# Patient Record
Sex: Female | Born: 1945
Health system: Southern US, Community
[De-identification: ages and names within clinical notes are randomized; demographics above are authoritative.]

## PROBLEM LIST (undated history)

## (undated) DIAGNOSIS — G40909 Epilepsy, unspecified, not intractable, without status epilepticus: Secondary | ICD-10-CM

## (undated) DIAGNOSIS — I10 Essential (primary) hypertension: Secondary | ICD-10-CM

## (undated) DIAGNOSIS — I639 Cerebral infarction, unspecified: Secondary | ICD-10-CM

## (undated) DIAGNOSIS — H269 Unspecified cataract: Secondary | ICD-10-CM

## (undated) DIAGNOSIS — K219 Gastro-esophageal reflux disease without esophagitis: Secondary | ICD-10-CM

## (undated) DIAGNOSIS — F0150 Vascular dementia without behavioral disturbance: Secondary | ICD-10-CM

## (undated) DIAGNOSIS — E785 Hyperlipidemia, unspecified: Secondary | ICD-10-CM

## (undated) HISTORY — DX: Epilepsy, unspecified, not intractable, without status epilepticus: G40.909

## (undated) HISTORY — DX: Unspecified cataract: H26.9

## (undated) HISTORY — PX: BUNIONECTOMY: SHX129

## (undated) HISTORY — DX: Hyperlipidemia, unspecified: E78.5

## (undated) HISTORY — DX: Vascular dementia, unspecified severity, without behavioral disturbance, psychotic disturbance, mood disturbance, and anxiety: F01.50

---

## 2011-07-31 DIAGNOSIS — I639 Cerebral infarction, unspecified: Secondary | ICD-10-CM

## 2011-07-31 HISTORY — DX: Cerebral infarction, unspecified: I63.9

## 2011-08-06 ENCOUNTER — Inpatient Hospital Stay (HOSPITAL_COMMUNITY): Payer: Medicare Other

## 2011-08-06 ENCOUNTER — Inpatient Hospital Stay (HOSPITAL_COMMUNITY)
Admission: EM | Admit: 2011-08-06 | Discharge: 2011-08-07 | DRG: 066 | Disposition: A | Discharge status: Home / Self Care | Payer: Medicare Other | Attending: Internal Medicine | Admitting: Internal Medicine

## 2011-08-06 ENCOUNTER — Encounter (HOSPITAL_COMMUNITY): Payer: Self-pay

## 2011-08-06 ENCOUNTER — Emergency Department (HOSPITAL_COMMUNITY): Payer: Medicare Other

## 2011-08-06 DIAGNOSIS — M4120 Other idiopathic scoliosis, site unspecified: Secondary | ICD-10-CM | POA: Diagnosis not present

## 2011-08-06 DIAGNOSIS — R7989 Other specified abnormal findings of blood chemistry: Secondary | ICD-10-CM | POA: Diagnosis present

## 2011-08-06 DIAGNOSIS — I6350 Cerebral infarction due to unspecified occlusion or stenosis of unspecified cerebral artery: Secondary | ICD-10-CM | POA: Diagnosis not present

## 2011-08-06 DIAGNOSIS — I6330 Cerebral infarction due to thrombosis of unspecified cerebral artery: Secondary | ICD-10-CM

## 2011-08-06 DIAGNOSIS — R5383 Other fatigue: Secondary | ICD-10-CM | POA: Diagnosis not present

## 2011-08-06 DIAGNOSIS — R5381 Other malaise: Secondary | ICD-10-CM | POA: Diagnosis not present

## 2011-08-06 DIAGNOSIS — I10 Essential (primary) hypertension: Secondary | ICD-10-CM | POA: Diagnosis present

## 2011-08-06 DIAGNOSIS — R269 Unspecified abnormalities of gait and mobility: Secondary | ICD-10-CM | POA: Diagnosis not present

## 2011-08-06 DIAGNOSIS — Z720 Tobacco use: Secondary | ICD-10-CM | POA: Diagnosis present

## 2011-08-06 DIAGNOSIS — I639 Cerebral infarction, unspecified: Secondary | ICD-10-CM | POA: Diagnosis present

## 2011-08-06 DIAGNOSIS — R4789 Other speech disturbances: Secondary | ICD-10-CM | POA: Diagnosis not present

## 2011-08-06 DIAGNOSIS — F17200 Nicotine dependence, unspecified, uncomplicated: Secondary | ICD-10-CM | POA: Diagnosis present

## 2011-08-06 DIAGNOSIS — I619 Nontraumatic intracerebral hemorrhage, unspecified: Secondary | ICD-10-CM | POA: Diagnosis not present

## 2011-08-06 DIAGNOSIS — I6789 Other cerebrovascular disease: Secondary | ICD-10-CM | POA: Diagnosis not present

## 2011-08-06 HISTORY — DX: Gastro-esophageal reflux disease without esophagitis: K21.9

## 2011-08-06 HISTORY — DX: Essential (primary) hypertension: I10

## 2011-08-06 HISTORY — DX: Cerebral infarction, unspecified: I63.9

## 2011-08-06 LAB — BASIC METABOLIC PANEL
BUN: 19 mg/dL (ref 6–23)
CO2: 25 mEq/L (ref 19–32)
Calcium: 10.2 mg/dL (ref 8.4–10.5)
Glucose, Bld: 131 mg/dL — ABNORMAL HIGH (ref 70–99)
Sodium: 139 mEq/L (ref 135–145)

## 2011-08-06 LAB — CBC
HCT: 39.8 % (ref 36.0–46.0)
Hemoglobin: 13.3 g/dL (ref 12.0–15.0)
MCV: 85.6 fL (ref 78.0–100.0)
Platelets: 327 10*3/uL (ref 150–400)
RBC: 4.65 MIL/uL (ref 3.87–5.11)
WBC: 6.2 10*3/uL (ref 4.0–10.5)

## 2011-08-06 LAB — DIFFERENTIAL
Eosinophils Relative: 1 % (ref 0–5)
Lymphocytes Relative: 39 % (ref 12–46)
Lymphs Abs: 2.4 10*3/uL (ref 0.7–4.0)
Monocytes Relative: 6 % (ref 3–12)

## 2011-08-06 MED ORDER — SODIUM CHLORIDE 0.9 % IV SOLN: INTRAVENOUS | Status: DC

## 2011-08-06 MED ORDER — POTASSIUM CHLORIDE CRYS ER 20 MEQ PO TBCR: 40.0000 meq | EXTENDED_RELEASE_TABLET | Freq: Once | ORAL | Status: DC

## 2011-08-06 MED ORDER — ASPIRIN 300 MG RE SUPP
300.0000 mg | Freq: Every day | RECTAL | Status: DC
  Filled 2011-08-06 (×4): qty 1

## 2011-08-06 MED ORDER — SENNOSIDES-DOCUSATE SODIUM 8.6-50 MG PO TABS: 1.0000 | ORAL_TABLET | Freq: Every evening | ORAL | Status: DC | PRN

## 2011-08-06 MED ORDER — ENALAPRILAT 1.25 MG/ML IV SOLN
1.2500 mg | Freq: Four times a day (QID) | INTRAVENOUS | Status: DC | PRN
  Administered 2011-08-06: 1.25 mg via INTRAVENOUS
  Filled 2011-08-06: qty 2

## 2011-08-06 MED ORDER — ASPIRIN 325 MG PO TABS
325.0000 mg | ORAL_TABLET | Freq: Every day | ORAL | Status: DC
  Administered 2011-08-07: 325 mg via ORAL
  Filled 2011-08-06: qty 1

## 2011-08-06 MED ORDER — HEPARIN SODIUM (PORCINE) 5000 UNIT/ML IJ SOLN
5000.0000 [IU] | Freq: Three times a day (TID) | INTRAMUSCULAR | Status: DC
  Administered 2011-08-06 – 2011-08-07 (×2): 5000 [IU] via SUBCUTANEOUS
  Filled 2011-08-06: qty 1

## 2011-08-06 NOTE — ED Provider Notes (Signed)
History     CSN: [id number]  Arrival date & time 08/06/11  1336   First MD Initiated Contact with Patient 08/06/11 1352      Chief Complaint  Patient presents with  . Weakness    (Consider location/radiation/quality/duration/timing/severity/associated sxs/prior treatment) HPI...complains of weakness and pain in both arms for 3 weeks. Says her body is  "locked up".  Lanes of blurred vision, weakness, fatigue, slurred speech, vomiting.Marland Kitchen  He makes symptoms better or worse.  Symptoms are moderate.  History reviewed. No pertinent past medical history.  History reviewed. No pertinent past surgical history.  No family history on file.  History  Substance Use Topics  . Smoking status: Current Everyday Smoker  . Smokeless tobacco: Not on file  . Alcohol Use: No    OB History    Grav Para Term Preterm Abortions TAB SAB Ect Mult Living                  Review of Systems  All other systems reviewed and are negative.    Allergies  Review of patient's allergies indicates no known allergies.  Home Medications  No current outpatient prescriptions on file.  BP 190/81  Pulse 85  Temp(Src) 98.7 F (37.1 C) (Oral)  Resp 20  Ht 5\' 2"  (1.575 m)  Wt 145 lb (65.772 kg)  BMI 26.52 kg/m2  SpO2 100%  Physical Exam  Nursing note and vitals reviewed. Constitutional: She is oriented to person, place, and time. She appears well-developed and well-nourished.  HENT:  Head: Normocephalic and atraumatic.  Eyes: Conjunctivae and EOM are normal. Pupils are equal, round, and reactive to light.  Neck: Normal range of motion. Neck supple.  Cardiovascular: Normal rate and regular rhythm.   Pulmonary/Chest: Effort normal and breath sounds normal.  Abdominal: Soft. Bowel sounds are normal.  Musculoskeletal: Normal range of motion.  Neurological: She is alert and oriented to person, place, and time.  Skin: Skin is warm and dry.  Psychiatric: She has a normal mood and affect.    ED Course    Procedures (including critical care time)  Labs Reviewed  BASIC METABOLIC PANEL - Abnormal; Notable for the following:    Potassium 3.4 (*)    Glucose, Bld 131 (*)    GFR calc non Af Amer 58 (*)    GFR calc Af Amer 68 (*)    All other components within normal limits  HEMOGLOBIN A1C - Abnormal; Notable for the following:    Hemoglobin A1C 5.9 (*)    Mean Plasma Glucose 123 (*)    All other components within normal limits  CBC  DIFFERENTIAL  LIPID PANEL  LAB REPORT - SCANNED   No results found.   No diagnosis found. No results found.  Date: 08/06/2011  Rate: 83  Rhythm: normal sinus rhythm  QRS Axis: normal  Intervals: normal  ST/T Wave abnormalities: normal  Conduction Disutrbances:none  Narrative Interpretation:   Old EKG Reviewed: none available LVH  MDM  Uncertain etiology of symptom complex. Now feels comfortable saying this patient home. Will admit        Donnetta Hutching, MD 08/10/11 2020

## 2011-08-06 NOTE — ED Notes (Signed)
Pt in Radiology for testing. VSS. No acute distress noted before pt transported.

## 2011-08-06 NOTE — ED Notes (Signed)
Pt returned to room from Radiology. Placed on cardiac monitor. No acute distress noted at present. Pt alert and oriented, moving all extremities. No slur to speech.

## 2011-08-06 NOTE — ED Notes (Signed)
Pt c/o bilateral weakness and pain in both arms x 3 weeks.  Saturday says her body "locked up"  Had slurred speech, blurred vision, and fatigue.  Started vomiting SUnday.

## 2011-08-06 NOTE — H&P (Addendum)
[name] MRN: [id number] DOB/AGE: 07-25-1945 66 y.o.  Admit date: 08/06/2011 Chief Complaint: Abnormal gait. HPI: This 66 year old lady has had problems with gait for the last 3-4 weeks. She also describes difficulty combing her hair and tying her shoelaces. The family are noticed slurred speech in the last few days, which is improved. When she presented to the emergency room today, she was noted to have a CVA in the right MCA territory on CT brain scan. She is now being admitted for further management.  History reviewed. No pertinent past medical history. History reviewed. No pertinent past surgical history.       Family history: Patient's mother had hypertension. Social History:  Patient is divorced and lives with her daughter. She smokes one pack of cigarettes per day. She does not drink alcohol.  Allergies: No Known Allergies   (Not in a hospital admission)     MWN:UUVOZ from the symptoms mentioned above,there are no other symptoms referable to all systems reviewed.  Physical Exam: Blood pressure 158/80, pulse 81, temperature 98.5 F (36.9 C), temperature source Oral, resp. rate 16, height 5\' 2"  (1.575 m), weight 65.772 kg (145 lb), SpO2 100.00%. She looks systemically well. She is alert and orientated. Her speech is normal. There are no obvious cranial nerve abnormalities. There is really no significant weakness in her limbs. Plantars are downgoing. Heart sounds are present and normal. There are no no murmurs. There are no carotid bruits. Lung fields are clear. Abdomen is soft nontender with no evidence of masses, or hepatosplenomegaly.    Basename 08/06/11 1433  WBC 6.2  NEUTROABS 3.3  HGB 13.3  HCT 39.8  MCV 85.6  PLT 327    Basename 08/06/11 1433  NA 139  K 3.4*  CL 103  CO2 25  GLUCOSE 131*  BUN 19  CREATININE 0.98  CALCIUM 10.2  MG --         Ct Head Wo Contrast  08/06/2011  *RADIOLOGY REPORT*  Clinical Data: Right-sided weakness.  Slurred speech.   CT HEAD WITHOUT CONTRAST  Technique:  Contiguous axial images were obtained from the base of the skull through the vertex without contrast.  Comparison: None.  Findings: There is low attenuation in the right posterior frontal and parietal region.  The distribution is compatible with distal right MCA infarct.  This involves less than one third of the right MCA distribution.  Small right basal ganglia lacunar infarct is present. Questionable low attenuation is present in the inferior right temporal lobe.  This area is difficult to evaluate because of beam hardening artifact from the skull base.  There is no hemorrhage, mass lesion, midline shift or hydrocephalus.  Small lacunar infarct is present in the mid to posterior right frontal lobe (image number 21 series 2).  The calvarium appears intact.  Mastoid air cells are clear.  The probable mucocele in the right posterior ethmoid air cell, with mild expansion.  IMPRESSION: 1.  Right middle cerebral artery distribution low attenuation compatible with subacute ischemic infarction.  No hemorrhagic transformation. 2.  Scattered smaller lacunar infarctions bilaterally are age indeterminant. 3.  Right posterior ethmoid air cell mucocele.  Critical Value/emergent results were called by telephone at the time of interpretation on 08/06/2011  at 1455 hours  to  Dr. Adriana Simas, who verbally acknowledged these results.  Original Report Authenticated By: Andreas Newport, M.D.   Impression: 1. Right middle cerebral artery territory CVA. Also, she has other several small lacunar infarcts bilaterally. 2. Hypertension, likely long-standing with abnormal  electrocardiogram and evidence of  LVH. 3. Tobacco abuse.      Plan:  1. Admit to telemetry floor. 2. Investigations relevant to CVA  stroke per protocol. 3. I have counseled the patient on tobacco cessation. 4. Monitor blood pressure closely. Likely she will need antihypertensive medications. 5. Start aspirin 325 mg  daily. Further recommendations will depend on patient's hospital progress.       Wilson Singer Pager [phone]  08/06/2011, 4:37 PM

## 2011-08-06 NOTE — ED Notes (Signed)
Put patient on monitor 

## 2011-08-06 NOTE — ED Notes (Signed)
Pt transferred via stretcher to room 339. Report given. Falls bracelet and red socks in place. IV patent. Pt alert and oriented. Family with pt.

## 2011-08-07 DIAGNOSIS — I10 Essential (primary) hypertension: Secondary | ICD-10-CM

## 2011-08-07 DIAGNOSIS — I6330 Cerebral infarction due to thrombosis of unspecified cerebral artery: Secondary | ICD-10-CM

## 2011-08-07 DIAGNOSIS — F17200 Nicotine dependence, unspecified, uncomplicated: Secondary | ICD-10-CM

## 2011-08-07 LAB — LIPID PANEL
LDL Cholesterol: 90 mg/dL (ref 0–99)
Triglycerides: 136 mg/dL (ref <150)
VLDL: 27 mg/dL (ref 0–40)

## 2011-08-07 MED ORDER — SIMVASTATIN 20 MG PO TABS: 20.0000 mg | ORAL_TABLET | Freq: Every day | ORAL | Status: DC

## 2011-08-07 MED ORDER — ENALAPRIL MALEATE 5 MG PO TABS
5.0000 mg | ORAL_TABLET | Freq: Every day | ORAL | Status: DC
  Administered 2011-08-07: 5 mg via ORAL
  Filled 2011-08-07: qty 1

## 2011-08-07 MED ORDER — ENALAPRIL MALEATE 5 MG PO TABS: 5.0000 mg | ORAL_TABLET | Freq: Every day | ORAL | Status: DC

## 2011-08-07 MED ORDER — ASPIRIN 325 MG PO TABS: 325.0000 mg | ORAL_TABLET | Freq: Every day | ORAL | Status: AC

## 2011-08-07 NOTE — Consult Note (Signed)
Reason for Consult: Referring Physician:   [name] is an 66 y.o. female.  HPI:   Past Medical History  Diagnosis Date  . Hypertension   . GERD (gastroesophageal reflux disease)   . Stroke     History reviewed. No pertinent past surgical history.  No family history on file.  Social History:  reports that she has been smoking Cigarettes.  She has a 50 pack-year smoking history. She has never used smokeless tobacco. She reports that she does not drink alcohol or use illicit drugs.  Allergies: No Known Allergies  Medications:  Prior to Admission medications   Medication Sig Start Date End Date Taking? Authorizing Provider  ibuprofen (ADVIL,MOTRIN) 200 MG tablet Take 200 mg by mouth daily as needed. For pain   Yes Historical Provider, MD   Scheduled Meds:   . aspirin  300 mg Rectal Daily   Or  . aspirin  325 mg Oral Daily  . heparin  5,000 Units Subcutaneous Q8H  . potassium chloride  40 mEq Oral Once  . simvastatin  20 mg Oral q1800   Continuous Infusions:   . DISCONTD: sodium chloride     PRN Meds:.enalaprilat, senna-docusate   Results for orders placed during the hospital encounter of 08/06/11 (from the past 48 hour(s))  CBC     Status: Normal   Collection Time   08/06/11  2:33 PM      Component Value Range Comment   WBC 6.2  4.0 - 10.5 (K/uL)    RBC 4.65  3.87 - 5.11 (MIL/uL)    Hemoglobin 13.3  12.0 - 15.0 (g/dL)    HCT 65.7  84.6 - 96.2 (%)    MCV 85.6  78.0 - 100.0 (fL)    MCH 28.6  26.0 - 34.0 (pg)    MCHC 33.4  30.0 - 36.0 (g/dL)    RDW 95.2  84.1 - 32.4 (%)    Platelets 327  150 - 400 (K/uL)   DIFFERENTIAL     Status: Normal   Collection Time   08/06/11  2:33 PM      Component Value Range Comment   Neutrophils Relative 54  43 - 77 (%)    Neutro Abs 3.3  1.7 - 7.7 (K/uL)    Lymphocytes Relative 39  12 - 46 (%)    Lymphs Abs 2.4  0.7 - 4.0 (K/uL)    Monocytes Relative 6  3 - 12 (%)    Monocytes Absolute 0.4  0.1 - 1.0 (K/uL)    Eosinophils Relative  1  0 - 5 (%)    Eosinophils Absolute 0.1  0.0 - 0.7 (K/uL)    Basophils Relative 0  0 - 1 (%)    Basophils Absolute 0.0  0.0 - 0.1 (K/uL)   BASIC METABOLIC PANEL     Status: Abnormal   Collection Time   08/06/11  2:33 PM      Component Value Range Comment   Sodium 139  135 - 145 (mEq/L)    Potassium 3.4 (*) 3.5 - 5.1 (mEq/L)    Chloride 103  96 - 112 (mEq/L)    CO2 25  19 - 32 (mEq/L)    Glucose, Bld 131 (*) 70 - 99 (mg/dL)    BUN 19  6 - 23 (mg/dL)    Creatinine, Ser 4.01  0.50 - 1.10 (mg/dL)    Calcium 02.7  8.4 - 10.5 (mg/dL)    GFR calc non Af Amer 58 (*) >90 (mL/min)  GFR calc Af Amer 68 (*) >90 (mL/min)   LIPID PANEL     Status: Normal   Collection Time   08/07/11  5:28 AM      Component Value Range Comment   Cholesterol 173  0 - 200 (mg/dL)    Triglycerides 161  <150 (mg/dL)    HDL 56  >09 (mg/dL)    Total CHOL/HDL Ratio 3.1      VLDL 27  0 - 40 (mg/dL)    LDL Cholesterol 90  0 - 99 (mg/dL)     Dg Chest 2 View  6/0/4540  *RADIOLOGY REPORT*  Clinical Data: Stroke  CHEST - 2 VIEW  Comparison: None.  Findings: Severe dextroscoliosis in the upper lumbar spine is present.  This distorts the lower right thorax.  Heart is normal in size.  Lungs are hyperaerated.  No pneumothorax.  No pleural effusion.  No definite consolidation or mass.  Interstitial prominence is noted.  IMPRESSION: Chronic changes.  No active cardiopulmonary disease. Dextroscoliosis of the lumbar spine is prominent.  Original Report Authenticated By: Donavan Burnet, M.D.   Ct Head Wo Contrast  08/06/2011  *RADIOLOGY REPORT*  Clinical Data: Right-sided weakness.  Slurred speech.  CT HEAD WITHOUT CONTRAST  Technique:  Contiguous axial images were obtained from the base of the skull through the vertex without contrast.  Comparison: None.  Findings: There is low attenuation in the right posterior frontal and parietal region.  The distribution is compatible with distal right MCA infarct.  This involves less than one  third of the right MCA distribution.  Small right basal ganglia lacunar infarct is present. Questionable low attenuation is present in the inferior right temporal lobe.  This area is difficult to evaluate because of beam hardening artifact from the skull base.  There is no hemorrhage, mass lesion, midline shift or hydrocephalus.  Small lacunar infarct is present in the mid to posterior right frontal lobe (image number 21 series 2).  The calvarium appears intact.  Mastoid air cells are clear.  The probable mucocele in the right posterior ethmoid air cell, with mild expansion.  IMPRESSION: 1.  Right middle cerebral artery distribution low attenuation compatible with subacute ischemic infarction.  No hemorrhagic transformation. 2.  Scattered smaller lacunar infarctions bilaterally are age indeterminant. 3.  Right posterior ethmoid air cell mucocele.  Critical Value/emergent results were called by telephone at the time of interpretation on 08/06/2011  at 1455 hours  to  Dr. Adriana Simas, who verbally acknowledged these results.  Original Report Authenticated By: Andreas Newport, M.D.   Mr [name] Head Wo Contrast  08/06/2011  *RADIOLOGY REPORT*  Clinical Data:  Gait difficulty.  Slurred speech.  MRI HEAD WITHOUT CONTRAST MRA HEAD WITHOUT CONTRAST  Technique:  Multiplanar, multiecho pulse sequences of the brain and surrounding structures were obtained without intravenous contrast. Angiographic images of the head were obtained using MRA technique without contrast.  Comparison:  CT head performed earlier in the day.  MRI HEAD  Findings:  Multifocal areas of acute infarction affect the right hemisphere predominately affecting the right posterior frontoparietal region. Slight T1 shortening and slight T2 shortening on gradient sequence suggests reperfusion, possibly embolic or related to late acute or early subacute infarction.  Low level restricted diffusion can be seen in the more anterior frontal cortex and subcortical white matter  as well as the medial right middle cerebellar peduncle likely older subacute infarcts.  Mild atrophy is present.  Chronic microvascular ischemic changes are noted.  There is evidence  for remote basal ganglia, thalamic, and deep white matter lacunar infarcts. Remote multifocal right cerebellar infarctions noted.  The major intracranial vessels structures are widely patent. Pituitary and cranial vertebral junction unremarkable.  Moderate cervical spondylosis suspected.  Retention cyst versus mucocele posterior ethmoid region on the right. No mastoid fluid.  IMPRESSION: Multifocal areas of acute infarction with the largest area of involvement in the right posterior frontoparietal region.  There are some areas of acute to subacute gyriform like hemorrhage suggesting embolic stroke or reperfusion type phenomenon.  Right frontal and right cerebellar infarcts display  low level restricted diffusion suggesting an earlier time course, possibly later subacute infarction.  Evidence for multifocal remote cerebellar and bilateral supratentorial infarctions.  MRA HEAD  Findings: Motion degraded images result in misregistration of the skull base internal carotid arteries and proximal basilar arteries which are grossly patent.  Dissection cannot however be excluded. There is no visible stenosis of the carotid or basilar arteries. Vertebrals are codominant.  There is no proximal anterior, middle, or posterior cerebral artery stenosis or occlusion.  No definite intracranial aneurysm.  IMPRESSION: Motion degraded examination.  No definite carotid, basilar, or proximal intracranial lesion.  Original Report Authenticated By: Elsie Stain, M.D.   Mr Brain Wo Contrast  08/06/2011  *RADIOLOGY REPORT*  Clinical Data:  Gait difficulty.  Slurred speech.  MRI HEAD WITHOUT CONTRAST MRA HEAD WITHOUT CONTRAST  Technique:  Multiplanar, multiecho pulse sequences of the brain and surrounding structures were obtained without intravenous contrast.  Angiographic images of the head were obtained using MRA technique without contrast.  Comparison:  CT head performed earlier in the day.  MRI HEAD  Findings:  Multifocal areas of acute infarction affect the right hemisphere predominately affecting the right posterior frontoparietal region. Slight T1 shortening and slight T2 shortening on gradient sequence suggests reperfusion, possibly embolic or related to late acute or early subacute infarction.  Low level restricted diffusion can be seen in the more anterior frontal cortex and subcortical white matter as well as the medial right middle cerebellar peduncle likely older subacute infarcts.  Mild atrophy is present.  Chronic microvascular ischemic changes are noted.  There is evidence for remote basal ganglia, thalamic, and deep white matter lacunar infarcts. Remote multifocal right cerebellar infarctions noted.  The major intracranial vessels structures are widely patent. Pituitary and cranial vertebral junction unremarkable.  Moderate cervical spondylosis suspected.  Retention cyst versus mucocele posterior ethmoid region on the right. No mastoid fluid.  IMPRESSION: Multifocal areas of acute infarction with the largest area of involvement in the right posterior frontoparietal region.  There are some areas of acute to subacute gyriform like hemorrhage suggesting embolic stroke or reperfusion type phenomenon.  Right frontal and right cerebellar infarcts display  low level restricted diffusion suggesting an earlier time course, possibly later subacute infarction.  Evidence for multifocal remote cerebellar and bilateral supratentorial infarctions.  MRA HEAD  Findings: Motion degraded images result in misregistration of the skull base internal carotid arteries and proximal basilar arteries which are grossly patent.  Dissection cannot however be excluded. There is no visible stenosis of the carotid or basilar arteries. Vertebrals are codominant.  There is no proximal  anterior, middle, or posterior cerebral artery stenosis or occlusion.  No definite intracranial aneurysm.  IMPRESSION: Motion degraded examination.  No definite carotid, basilar, or proximal intracranial lesion.  Original Report Authenticated By: Elsie Stain, M.D.   US Carotid Duplex Bilateral  08/07/2011  *RADIOLOGY REPORT*  Clinical Data: Acute CVA; assess for carotid  disease.  BILATERAL CAROTID DUPLEX ULTRASOUND  Technique: Wallace Cullens scale imaging, color Doppler and duplex ultrasound was performed of bilateral carotid and vertebral arteries in the neck.  Comparison:  None  Criteria:  Quantification of carotid stenosis is based on velocity parameters that correlate the residual internal carotid diameter with NASCET-based stenosis levels, using the diameter of the distal internal carotid lumen as the denominator for stenosis measurement.  The following velocity measurements were obtained:                   PEAK SYSTOLIC/END DIASTOLIC RIGHT ICA:                        84 / 25cm/sec CCA:                        78 / 9cm/sec SYSTOLIC ICA/CCA RATIO:     1.08 DIASTOLIC ICA/CCA RATIO:    2.15 ECA:                        84cm/sec  LEFT ICA:                        87 / 25cm/sec CCA:                        91 / 19cm/sec SYSTOLIC ICA/CCA RATIO:     0.95 DIASTOLIC ICA/CCA RATIO:    1.27 ECA:                        78cm/sec  Findings:  RIGHT CAROTID ARTERY: There is minimal intramural thrombus noted along the right common carotid artery, without significant stenosis.  There is tortuosity of the right internal carotid artery.  Waveform morphologies remain within normal limits.  RIGHT VERTEBRAL ARTERY:  Antegrade  LEFT CAROTID ARTERY: There is minimal intramural thrombus noted along the left common carotid artery, without significant stenosis. There is tortuosity of the left internal carotid artery.  Waveform morphologies remain within normal limits.  LEFT VERTEBRAL ARTERY:  Antegrade  IMPRESSION: Grossly unremarkable carotid  ultrasound; minimal intramural thrombus along the common carotid arteries, without evidence of clinically significant stenosis.  Tortuosity of the internal carotid arteries bilaterally.  Original Report Authenticated By: Tonia Ghent, M.D.    Review of Systems  Constitutional: Negative.   Eyes: Negative.  Photophobia: right shoulder pain.  Respiratory: Negative.   Cardiovascular: Negative.   Genitourinary: Negative.   Skin: Negative.   Neurological: Positive for headaches.  Endo/Heme/Allergies: Negative.   Psychiatric/Behavioral: Negative.        R shoulder pain   Blood pressure 145/69, pulse 67, temperature 98 F (36.7 C), temperature source Oral, resp. rate 20, height 5\' 2"  (1.575 m), weight 66.2 kg (145 lb 15.1 oz), SpO2 99.00%. Physical Exam  Assessment/Plan: See dict  [name] 08/07/2011, 9:43 AM

## 2011-08-07 NOTE — Progress Notes (Signed)
*  PRELIMINARY RESULTS* Echocardiogram 2D Echocardiogram has been performed.  [name] 08/07/2011, 10:38 AM

## 2011-08-07 NOTE — Care Management Note (Unsigned)
    Page 1 of 1   08/07/2011     3:21:52 PM   CARE MANAGEMENT NOTE 08/07/2011  Patient:  [name], [name]   Account Number:  [id number]  Date Initiated:  08/07/2011  Documentation initiated by:  Rosemary Holms  Subjective/Objective Assessment:   Pt admitted with CVA with symptoms for 4 weeks. Family wishes to have HH PT. Barrier to [facility] is no PCP. Family member states that pt does have an appointment with Dr. Jeanice Lim on 5/28 but would not sign HH orders. Requested assistance w/ M'[facility]id     Action/Plan:   Pt to be discharged with family assistance. Family will evaluate ambulation and speak to Dr. Jeanice Lim about OP PT.Marland Kitchen   Anticipated DC Date:  08/08/2011   Anticipated DC Plan:  HOME/SELF CARE  In-house referral  Financial Counselor      DC Planning Services  CM consult      Choice offered to / List presented to:             Status of service:  In process, will continue to follow Medicare Important Message given?   (If response is "NO", the following Medicare IM given date fields will be blank) Date Medicare IM given:   Date Additional Medicare IM given:    Discharge Disposition:    Per UR Regulation:    If discussed at Long Length of Stay Meetings, dates discussed:    Comments:  08/07/11 1500 Yelitza Reach Leanord Hawking RN BSN CM

## 2011-08-07 NOTE — Discharge Summary (Signed)
Physician Discharge Summary  Patient ID: [name] MRN: [id number] DOB/AGE: 1945-07-04 66 y.o.  Admit date: 08/06/2011 Discharge date: 08/07/2011    Discharge Diagnoses:  1. Acute CVA, multi-infarct. 2. Hypertension. 3. Tobacco abuse. 4. Suboptimal LDL cholesterol in the setting of cerebrovascular disease.   Medication List  As of 08/07/2011  4:29 PM   TAKE these medications         aspirin 325 MG tablet   Take 1 tablet (325 mg total) by mouth daily.      enalapril 5 MG tablet   Commonly known as: VASOTEC   Take 1 tablet (5 mg total) by mouth daily.      ibuprofen 200 MG tablet   Commonly known as: ADVIL,MOTRIN   Take 200 mg by mouth daily as needed. For pain      simvastatin 20 MG tablet   Commonly known as: ZOCOR   Take 1 tablet (20 mg total) by mouth daily at 6 PM.            Discharged Condition: Stable.    Consults: Neurology, Dr Gerilyn Pilgrim.  Significant Diagnostic Studies: Dg Chest 2 View  08/06/2011  *RADIOLOGY REPORT*  Clinical Data: Stroke  CHEST - 2 VIEW  Comparison: None.  Findings: Severe dextroscoliosis in the upper lumbar spine is present.  This distorts the lower right thorax.  Heart is normal in size.  Lungs are hyperaerated.  No pneumothorax.  No pleural effusion.  No definite consolidation or mass.  Interstitial prominence is noted.  IMPRESSION: Chronic changes.  No active cardiopulmonary disease. Dextroscoliosis of the lumbar spine is prominent.  Original Report Authenticated By: Donavan Burnet, M.D.   Ct Head Wo Contrast  08/06/2011  *RADIOLOGY REPORT*  Clinical Data: Right-sided weakness.  Slurred speech.  CT HEAD WITHOUT CONTRAST  Technique:  Contiguous axial images were obtained from the base of the skull through the vertex without contrast.  Comparison: None.  Findings: There is low attenuation in the right posterior frontal and parietal region.  The distribution is compatible with distal right MCA infarct.  This involves less than one third of the  right MCA distribution.  Small right basal ganglia lacunar infarct is present. Questionable low attenuation is present in the inferior right temporal lobe.  This area is difficult to evaluate because of beam hardening artifact from the skull base.  There is no hemorrhage, mass lesion, midline shift or hydrocephalus.  Small lacunar infarct is present in the mid to posterior right frontal lobe (image number 21 series 2).  The calvarium appears intact.  Mastoid air cells are clear.  The probable mucocele in the right posterior ethmoid air cell, with mild expansion.  IMPRESSION: 1.  Right middle cerebral artery distribution low attenuation compatible with subacute ischemic infarction.  No hemorrhagic transformation. 2.  Scattered smaller lacunar infarctions bilaterally are age indeterminant. 3.  Right posterior ethmoid air cell mucocele.  Critical Value/emergent results were called by telephone at the time of interpretation on 08/06/2011  at 1455 hours  to  Dr. Adriana Simas, who verbally acknowledged these results.  Original Report Authenticated By: Andreas Newport, M.D.   Mr [name] Head Wo Contrast  08/06/2011  *RADIOLOGY REPORT*  Clinical Data:  Gait difficulty.  Slurred speech.  MRI HEAD WITHOUT CONTRAST MRA HEAD WITHOUT CONTRAST  Technique:  Multiplanar, multiecho pulse sequences of the brain and surrounding structures were obtained without intravenous contrast. Angiographic images of the head were obtained using MRA technique without contrast.  Comparison:  CT head performed earlier  in the day.  MRI HEAD  Findings:  Multifocal areas of acute infarction affect the right hemisphere predominately affecting the right posterior frontoparietal region. Slight T1 shortening and slight T2 shortening on gradient sequence suggests reperfusion, possibly embolic or related to late acute or early subacute infarction.  Low level restricted diffusion can be seen in the more anterior frontal cortex and subcortical white matter as well as the  medial right middle cerebellar peduncle likely older subacute infarcts.  Mild atrophy is present.  Chronic microvascular ischemic changes are noted.  There is evidence for remote basal ganglia, thalamic, and deep white matter lacunar infarcts. Remote multifocal right cerebellar infarctions noted.  The major intracranial vessels structures are widely patent. Pituitary and cranial vertebral junction unremarkable.  Moderate cervical spondylosis suspected.  Retention cyst versus mucocele posterior ethmoid region on the right. No mastoid fluid.  IMPRESSION: Multifocal areas of acute infarction with the largest area of involvement in the right posterior frontoparietal region.  There are some areas of acute to subacute gyriform like hemorrhage suggesting embolic stroke or reperfusion type phenomenon.  Right frontal and right cerebellar infarcts display  low level restricted diffusion suggesting an earlier time course, possibly later subacute infarction.  Evidence for multifocal remote cerebellar and bilateral supratentorial infarctions.  MRA HEAD  Findings: Motion degraded images result in misregistration of the skull base internal carotid arteries and proximal basilar arteries which are grossly patent.  Dissection cannot however be excluded. There is no visible stenosis of the carotid or basilar arteries. Vertebrals are codominant.  There is no proximal anterior, middle, or posterior cerebral artery stenosis or occlusion.  No definite intracranial aneurysm.  IMPRESSION: Motion degraded examination.  No definite carotid, basilar, or proximal intracranial lesion.  Original Report Authenticated By: Elsie Stain, M.D.   Mr Brain Wo Contrast  08/06/2011  *RADIOLOGY REPORT*  Clinical Data:  Gait difficulty.  Slurred speech.  MRI HEAD WITHOUT CONTRAST MRA HEAD WITHOUT CONTRAST  Technique:  Multiplanar, multiecho pulse sequences of the brain and surrounding structures were obtained without intravenous contrast. Angiographic  images of the head were obtained using MRA technique without contrast.  Comparison:  CT head performed earlier in the day.  MRI HEAD  Findings:  Multifocal areas of acute infarction affect the right hemisphere predominately affecting the right posterior frontoparietal region. Slight T1 shortening and slight T2 shortening on gradient sequence suggests reperfusion, possibly embolic or related to late acute or early subacute infarction.  Low level restricted diffusion can be seen in the more anterior frontal cortex and subcortical white matter as well as the medial right middle cerebellar peduncle likely older subacute infarcts.  Mild atrophy is present.  Chronic microvascular ischemic changes are noted.  There is evidence for remote basal ganglia, thalamic, and deep white matter lacunar infarcts. Remote multifocal right cerebellar infarctions noted.  The major intracranial vessels structures are widely patent. Pituitary and cranial vertebral junction unremarkable.  Moderate cervical spondylosis suspected.  Retention cyst versus mucocele posterior ethmoid region on the right. No mastoid fluid.  IMPRESSION: Multifocal areas of acute infarction with the largest area of involvement in the right posterior frontoparietal region.  There are some areas of acute to subacute gyriform like hemorrhage suggesting embolic stroke or reperfusion type phenomenon.  Right frontal and right cerebellar infarcts display  low level restricted diffusion suggesting an earlier time course, possibly later subacute infarction.  Evidence for multifocal remote cerebellar and bilateral supratentorial infarctions.  MRA HEAD  Findings: Motion degraded images result in misregistration of  the skull base internal carotid arteries and proximal basilar arteries which are grossly patent.  Dissection cannot however be excluded. There is no visible stenosis of the carotid or basilar arteries. Vertebrals are codominant.  There is no proximal anterior, middle,  or posterior cerebral artery stenosis or occlusion.  No definite intracranial aneurysm.  IMPRESSION: Motion degraded examination.  No definite carotid, basilar, or proximal intracranial lesion.  Original Report Authenticated By: Elsie Stain, M.D.   US Carotid Duplex Bilateral  08/07/2011  *RADIOLOGY REPORT*  Clinical Data: Acute CVA; assess for carotid disease.  BILATERAL CAROTID DUPLEX ULTRASOUND  Technique: Wallace Cullens scale imaging, color Doppler and duplex ultrasound was performed of bilateral carotid and vertebral arteries in the neck.  Comparison:  None  Criteria:  Quantification of carotid stenosis is based on velocity parameters that correlate the residual internal carotid diameter with NASCET-based stenosis levels, using the diameter of the distal internal carotid lumen as the denominator for stenosis measurement.  The following velocity measurements were obtained:                   PEAK SYSTOLIC/END DIASTOLIC RIGHT ICA:                        84 / 25cm/sec CCA:                        78 / 9cm/sec SYSTOLIC ICA/CCA RATIO:     1.08 DIASTOLIC ICA/CCA RATIO:    2.15 ECA:                        84cm/sec  LEFT ICA:                        87 / 25cm/sec CCA:                        91 / 19cm/sec SYSTOLIC ICA/CCA RATIO:     0.95 DIASTOLIC ICA/CCA RATIO:    1.27 ECA:                        78cm/sec  Findings:  RIGHT CAROTID ARTERY: There is minimal intramural thrombus noted along the right common carotid artery, without significant stenosis.  There is tortuosity of the right internal carotid artery.  Waveform morphologies remain within normal limits.  RIGHT VERTEBRAL ARTERY:  Antegrade  LEFT CAROTID ARTERY: There is minimal intramural thrombus noted along the left common carotid artery, without significant stenosis. There is tortuosity of the left internal carotid artery.  Waveform morphologies remain within normal limits.  LEFT VERTEBRAL ARTERY:  Antegrade  IMPRESSION: Grossly unremarkable carotid ultrasound; minimal  intramural thrombus along the common carotid arteries, without evidence of clinically significant stenosis.  Tortuosity of the internal carotid arteries bilaterally.  Original Report Authenticated By: Tonia Ghent, M.D.    Lab Results: Basic Metabolic Panel:  Basename 08/06/11 1433  NA 139  K 3.4*  CL 103  CO2 25  GLUCOSE 131*  BUN 19  CREATININE 0.98  CALCIUM 10.2  MG --  PHOS --       CBC:  Basename 08/06/11 1433  WBC 6.2  NEUTROABS 3.3  HGB 13.3  HCT 39.8  MCV 85.6  PLT 327       Hospital Course: This very pleasant 66 year old lady was admitted with symptoms of abnormal gait. According to family  members, she has had problems with her gait for the last 3-4 weeks. She finally came to the emergency room because she had difficulty combing her hair and tying her shoe laces. The family were also concerned about slurred speech in the last few days, which had improved by the time she came to the emergency room. When she was evaluated in the emergency room, she was found to have a CVA in the right MCA territory on CT brain scan. She was admitted and a workup for stroke was done. Bilateral carotid Dopplers did not show any significant stenosis. Echocardiogram did not show any evidence of thrombus and she had normal ejection fraction. MRI/MRA brain was done which showed multifocal areas of acute infarction with the largest area of involvement in the right posterior frontoparietal region. MRA of the head did not show any definite carotid, basilar or proximal intracranial lesion. During her hospitalization, her blood pressure has been noted to be elevated. Her LDL cholesterol is 90, suboptimal in the setting of cerebrovascular disease. She has been started on aspirin, simvastatin and enalapril. She does not have a primary care physician but the family have now found Dr. Jeanice Lim, who has agreed to be the primary care physician.  Discharge Exam: Blood pressure 151/78, pulse 66,  temperature 98.5 F (36.9 C), temperature source Oral, resp. rate 20, height 5\' 2"  (1.575 m), weight 66.2 kg (145 lb 15.1 oz), SpO2 100.00%. She looks systemically well. She is alert and orientated. There are no other focal neurological signs. Heart sounds are present and normal. There are no carotid bruits throat lung fields are clear. Abdomen is soft and nontender.  Disposition: Home. She will follow with her primary care physician within a week or 2.  Discharge Orders    Future Appointments: Provider: Department: Dept Phone: Center:   08/27/2011 1:15 PM Salley Scarlet, MD [facility] [phone] [facility]     Future Orders Please Complete By Expires   Diet - low sodium heart healthy      Increase activity slowly         Follow-up Information    Follow up with Milinda Antis, MD. Schedule an appointment as soon as possible for a visit in 1 week.   Contact information:   [address], [address] [place] [address] [phone]          Signed: Wilson Singer Pager [phone]  08/07/2011, 4:30 PM

## 2011-08-07 NOTE — Consult Note (Signed)
[name], [name]                  ACCOUNT NO.:  [id number]  MEDICAL RECORD NO.:  [id number]  LOCATION:  A339                          FACILITY:  [facility]  PHYSICIAN:  Alona Danford A. Gerilyn Pilgrim, M.D. DATE OF BIRTH:  1946/03/16  DATE OF CONSULTATION:  08/07/2011 DATE OF DISCHARGE:                                CONSULTATION   HISTORY OF PRESENT ILLNESS:  This is a 66 year old right-handed black female who has no significant past medical history.  She developed the acute onset of numbness of the left upper extremity about a week ago. She woke up and had these symptoms.  The symptoms improved significantly as the day went on.  She, however, reports that she still had difficulty using her left hand to do normal fingers, she cannot do.  Few years later, she developed numbness of the left lower extremity and gait impairment, did not seek medical attention.  She does not report having other symptoms.  She did have some mild left frontal headache yesterday which has resolved.  PHYSICAL EXAMINATION:  GENERAL:  A very pleasant lady, in no acute distress. HEENT:  Neck is supple.  Head is normocephalic, atraumatic. ABDOMEN:  Soft. EXTREMITIES:  No significant edema. MENTATION:  She is awake and alert.  She is lucid and coherent.  Speech, language, and cognition are intact.  Cranial nerve evaluation showed the pupils are equal, round, and reactive to light and accommodation. Extraocular movements are full.  She does seem to have least extinction on double simultaneous stimulation with visual field on the left side. 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  Tongue is midline.  Uvula is midline.  Shoulder shrugs are symmetric.  Motor examination shows some bilateral deltoid weakness, 4+/5 bilaterally. She also has a mild pronation drift in left upper extremity.  Distal strength is normal in the upper extremities.  She actually has good strength in the legs today and she has 5/5 strength  in the left hip flexion and dorsiflexion.  The right side is normal.  Tone and bulk were also normal throughout.  Sensation today shows normal to temperature and light touch.  Coordination shows no dysmetria, no past-pointing, no tremors.  Reflexes are slightly brisk, although plantars are both flexor.  IMAGING:  MRI was reviewed in person and shows a Y-shaped hyperintense lesion involving the right parieto-occipital area, somewhat in a watershed distribution.  There is also a similar finding tiny noted in the middle cerebral peduncle on the right thigh.  On echo gradient, there is a tiny area of hypointensity suggestive of hemosiderin deposits suggesting tiny hemorrhage.  ASSESSMENT:  Acute large vessel cortical infarct suggestive of a watershed, possibly cardioembolic.  Carotids show no hemodynamic significant stenosis.  Echo is pending.  She is on aspirin.  I think we will continue with this.  Agree with Physical Therapy.  She does not have a primary care provider and needs to have one in the outpatient setting.  Give that there is a tiny hemorrhage, we will discontinue the Lovenox and put her on sequential compression.  Thank you for this consultation.     Adiel Erney A. Gerilyn Pilgrim, M.D.  KAD/MEDQ  D:  08/07/2011  T:  08/07/2011  Job:  [id number]

## 2011-08-07 NOTE — Evaluation (Signed)
Physical Therapy Evaluation Patient Details Name: [name] MRN: [id number] DOB: 1945/12/07 Today's Date: 08/07/2011 Time: 7829-5621 PT Time Calculation (min): 24 min  PT Assessment / Plan / Recommendation Clinical Impression  Pt was found to have no functional abnormalities or deficits during eval.  No PT intervention is needed.    PT Assessment  Patent does not need any further PT services    Follow Up Recommendations  No PT follow up    Equipment Recommendations  None recommended by PT    Frequency      Precautions / Restrictions Precautions Precautions: None Restrictions Weight Bearing Restrictions: No   Pertinent Vitals/Pain       Mobility  Bed Mobility Bed Mobility: Supine to Sit;Sit to Supine Supine to Sit: 7: Independent;HOB flat Sit to Supine: 7: Independent;HOB flat Transfers Transfers: Sit to Stand;Stand to Sit Sit to Stand: 7: Independent Stand to Sit: 7: Independent Ambulation/Gait Ambulation/Gait Assistance: 7: Independent Ambulation Distance (Feet): 300 Feet (on level and ramp) Assistive device: None Gait Pattern: Within Functional Limits Stairs: No Wheelchair Mobility Wheelchair Mobility: No    Exercises     PT Goals    Visit Information  Last PT Received On: 08/07/11    Subjective Data  Subjective: feels fine Patient Stated Goal: return home   Prior Functioning  Home Living Lives With: Family Available Help at Discharge: Family Type of Home: House Home Access: Level entry Home Layout: One level Firefighter: Standard Home Adaptive Equipment: None Prior Function Level of Independence: Independent Able to Take Stairs?: Yes Driving: No Vocation: Retired Musician: No difficulties Dominant Hand: Right    Cognition  Overall Cognitive Status: Appears within functional limits for tasks assessed/performed Arousal/Alertness: Awake/alert Orientation Level: Appears intact for tasks assessed Behavior During  Session: [facility] for tasks performed    Extremity/Trunk Assessment Right Upper Extremity Assessment RUE ROM/Strength/Tone: Within functional levels RUE Sensation: WFL - Light Touch;WFL - Proprioception RUE Coordination: WFL - gross/fine motor Left Upper Extremity Assessment LUE ROM/Strength/Tone: Within functional levels LUE Sensation: WFL - Light Touch;WFL - Proprioception LUE Coordination: WFL - gross/fine motor Right Lower Extremity Assessment RLE ROM/Strength/Tone: Within functional levels RLE Sensation: WFL - Light Touch;WFL - Proprioception RLE Coordination: WFL - gross/fine motor Left Lower Extremity Assessment LLE ROM/Strength/Tone: Within functional levels LLE Sensation: WFL - Light Touch;WFL - Proprioception LLE Coordination: WFL - gross/fine motor Trunk Assessment Trunk Assessment: Normal   Balance Balance Balance Assessed: Yes High Level Balance High Level Balance Activites: Side stepping;Backward walking;Direction changes;Turns;Sudden stops;Head turns High Level Balance Comments: no abnormalities  End of Session PT - End of Session Equipment Utilized During Treatment: Gait belt Activity Tolerance: Patient tolerated treatment well Patient left: in bed;with call bell/phone within reach Nurse Communication: Mobility status   Chris, Narasimhan 08/07/2011, 11:19 AM

## 2011-08-07 NOTE — Progress Notes (Signed)
Subjective: This lady feels overall improved. She did not really have any significant weakness from her stroke. MRI of the brain was done yesterday which really showed a multi-infarct state with areas of infarction with the largest area involving the right posterior frontoparietal region. There also right frontal and right cerebellar infarcts and evidence for multifocal remote cerebellar and bilateral supratentorial infarctions. I think that she is actually at risk of developing vascular dementia.           Physical Exam: Blood pressure 145/69, pulse 67, temperature 98 F (36.7 C), temperature source Oral, resp. rate 20, height 5\' 2"  (1.575 m), weight 66.2 kg (145 lb 15.1 oz), SpO2 99.00%. She looks systemically well. She is alert and orientated. There are no focal neurological signs. Heart sounds are present and normal. Lung fields are clear. Blood pressure is slightly elevated and I will follow a trend.   Investigations:     Basic Metabolic Panel:  Basename 08/06/11 1433  NA 139  K 3.4*  CL 103  CO2 25  GLUCOSE 131*  BUN 19  CREATININE 0.98  CALCIUM 10.2  MG --  PHOS --       CBC:  Basename 08/06/11 1433  WBC 6.2  NEUTROABS 3.3  HGB 13.3  HCT 39.8  MCV 85.6  PLT 327   LDL cholesterol is 90.  Dg Chest 2 View  08/06/2011  *RADIOLOGY REPORT*  Clinical Data: Stroke  CHEST - 2 VIEW  Comparison: None.  Findings: Severe dextroscoliosis in the upper lumbar spine is present.  This distorts the lower right thorax.  Heart is normal in size.  Lungs are hyperaerated.  No pneumothorax.  No pleural effusion.  No definite consolidation or mass.  Interstitial prominence is noted.  IMPRESSION: Chronic changes.  No active cardiopulmonary disease. Dextroscoliosis of the lumbar spine is prominent.  Original Report Authenticated By: Donavan Burnet, M.D.   Ct Head Wo Contrast  08/06/2011  *RADIOLOGY REPORT*  Clinical Data: Right-sided weakness.  Slurred speech.  CT HEAD WITHOUT  CONTRAST  Technique:  Contiguous axial images were obtained from the base of the skull through the vertex without contrast.  Comparison: None.  Findings: There is low attenuation in the right posterior frontal and parietal region.  The distribution is compatible with distal right MCA infarct.  This involves less than one third of the right MCA distribution.  Small right basal ganglia lacunar infarct is present. Questionable low attenuation is present in the inferior right temporal lobe.  This area is difficult to evaluate because of beam hardening artifact from the skull base.  There is no hemorrhage, mass lesion, midline shift or hydrocephalus.  Small lacunar infarct is present in the mid to posterior right frontal lobe (image number 21 series 2).  The calvarium appears intact.  Mastoid air cells are clear.  The probable mucocele in the right posterior ethmoid air cell, with mild expansion.  IMPRESSION: 1.  Right middle cerebral artery distribution low attenuation compatible with subacute ischemic infarction.  No hemorrhagic transformation. 2.  Scattered smaller lacunar infarctions bilaterally are age indeterminant. 3.  Right posterior ethmoid air cell mucocele.  Critical Value/emergent results were called by telephone at the time of interpretation on 08/06/2011  at 1455 hours  to  Dr. Adriana Simas, who verbally acknowledged these results.  Original Report Authenticated By: Andreas Newport, M.D.   Mr [name] Head Wo Contrast  08/06/2011  *RADIOLOGY REPORT*  Clinical Data:  Gait difficulty.  Slurred speech.  MRI HEAD WITHOUT CONTRAST MRA  HEAD WITHOUT CONTRAST  Technique:  Multiplanar, multiecho pulse sequences of the brain and surrounding structures were obtained without intravenous contrast. Angiographic images of the head were obtained using MRA technique without contrast.  Comparison:  CT head performed earlier in the day.  MRI HEAD  Findings:  Multifocal areas of acute infarction affect the right hemisphere predominately  affecting the right posterior frontoparietal region. Slight T1 shortening and slight T2 shortening on gradient sequence suggests reperfusion, possibly embolic or related to late acute or early subacute infarction.  Low level restricted diffusion can be seen in the more anterior frontal cortex and subcortical white matter as well as the medial right middle cerebellar peduncle likely older subacute infarcts.  Mild atrophy is present.  Chronic microvascular ischemic changes are noted.  There is evidence for remote basal ganglia, thalamic, and deep white matter lacunar infarcts. Remote multifocal right cerebellar infarctions noted.  The major intracranial vessels structures are widely patent. Pituitary and cranial vertebral junction unremarkable.  Moderate cervical spondylosis suspected.  Retention cyst versus mucocele posterior ethmoid region on the right. No mastoid fluid.  IMPRESSION: Multifocal areas of acute infarction with the largest area of involvement in the right posterior frontoparietal region.  There are some areas of acute to subacute gyriform like hemorrhage suggesting embolic stroke or reperfusion type phenomenon.  Right frontal and right cerebellar infarcts display  low level restricted diffusion suggesting an earlier time course, possibly later subacute infarction.  Evidence for multifocal remote cerebellar and bilateral supratentorial infarctions.  MRA HEAD  Findings: Motion degraded images result in misregistration of the skull base internal carotid arteries and proximal basilar arteries which are grossly patent.  Dissection cannot however be excluded. There is no visible stenosis of the carotid or basilar arteries. Vertebrals are codominant.  There is no proximal anterior, middle, or posterior cerebral artery stenosis or occlusion.  No definite intracranial aneurysm.  IMPRESSION: Motion degraded examination.  No definite carotid, basilar, or proximal intracranial lesion.  Original Report  Authenticated By: Elsie Stain, M.D.   Mr Brain Wo Contrast  08/06/2011  *RADIOLOGY REPORT*  Clinical Data:  Gait difficulty.  Slurred speech.  MRI HEAD WITHOUT CONTRAST MRA HEAD WITHOUT CONTRAST  Technique:  Multiplanar, multiecho pulse sequences of the brain and surrounding structures were obtained without intravenous contrast. Angiographic images of the head were obtained using MRA technique without contrast.  Comparison:  CT head performed earlier in the day.  MRI HEAD  Findings:  Multifocal areas of acute infarction affect the right hemisphere predominately affecting the right posterior frontoparietal region. Slight T1 shortening and slight T2 shortening on gradient sequence suggests reperfusion, possibly embolic or related to late acute or early subacute infarction.  Low level restricted diffusion can be seen in the more anterior frontal cortex and subcortical white matter as well as the medial right middle cerebellar peduncle likely older subacute infarcts.  Mild atrophy is present.  Chronic microvascular ischemic changes are noted.  There is evidence for remote basal ganglia, thalamic, and deep white matter lacunar infarcts. Remote multifocal right cerebellar infarctions noted.  The major intracranial vessels structures are widely patent. Pituitary and cranial vertebral junction unremarkable.  Moderate cervical spondylosis suspected.  Retention cyst versus mucocele posterior ethmoid region on the right. No mastoid fluid.  IMPRESSION: Multifocal areas of acute infarction with the largest area of involvement in the right posterior frontoparietal region.  There are some areas of acute to subacute gyriform like hemorrhage suggesting embolic stroke or reperfusion type phenomenon.  Right frontal and  right cerebellar infarcts display  low level restricted diffusion suggesting an earlier time course, possibly later subacute infarction.  Evidence for multifocal remote cerebellar and bilateral supratentorial  infarctions.  MRA HEAD  Findings: Motion degraded images result in misregistration of the skull base internal carotid arteries and proximal basilar arteries which are grossly patent.  Dissection cannot however be excluded. There is no visible stenosis of the carotid or basilar arteries. Vertebrals are codominant.  There is no proximal anterior, middle, or posterior cerebral artery stenosis or occlusion.  No definite intracranial aneurysm.  IMPRESSION: Motion degraded examination.  No definite carotid, basilar, or proximal intracranial lesion.  Original Report Authenticated By: Elsie Stain, M.D.   US Carotid Duplex Bilateral  08/07/2011  *RADIOLOGY REPORT*  Clinical Data: Acute CVA; assess for carotid disease.  BILATERAL CAROTID DUPLEX ULTRASOUND  Technique: Wallace Cullens scale imaging, color Doppler and duplex ultrasound was performed of bilateral carotid and vertebral arteries in the neck.  Comparison:  None  Criteria:  Quantification of carotid stenosis is based on velocity parameters that correlate the residual internal carotid diameter with NASCET-based stenosis levels, using the diameter of the distal internal carotid lumen as the denominator for stenosis measurement.  The following velocity measurements were obtained:                   PEAK SYSTOLIC/END DIASTOLIC RIGHT ICA:                        84 / 25cm/sec CCA:                        78 / 9cm/sec SYSTOLIC ICA/CCA RATIO:     1.08 DIASTOLIC ICA/CCA RATIO:    2.15 ECA:                        84cm/sec  LEFT ICA:                        87 / 25cm/sec CCA:                        91 / 19cm/sec SYSTOLIC ICA/CCA RATIO:     0.95 DIASTOLIC ICA/CCA RATIO:    1.27 ECA:                        78cm/sec  Findings:  RIGHT CAROTID ARTERY: There is minimal intramural thrombus noted along the right common carotid artery, without significant stenosis.  There is tortuosity of the right internal carotid artery.  Waveform morphologies remain within normal limits.  RIGHT VERTEBRAL  ARTERY:  Antegrade  LEFT CAROTID ARTERY: There is minimal intramural thrombus noted along the left common carotid artery, without significant stenosis. There is tortuosity of the left internal carotid artery.  Waveform morphologies remain within normal limits.  LEFT VERTEBRAL ARTERY:  Antegrade  IMPRESSION: Grossly unremarkable carotid ultrasound; minimal intramural thrombus along the common carotid arteries, without evidence of clinically significant stenosis.  Tortuosity of the internal carotid arteries bilaterally.  Original Report Authenticated By: Tonia Ghent, M.D.      Medications:  Scheduled:   . aspirin  300 mg Rectal Daily   Or  . aspirin  325 mg Oral Daily  . heparin  5,000 Units Subcutaneous Q8H  . potassium chloride  40 mEq Oral Once  . simvastatin  20 mg Oral q1800  Impression: 1. Multi-infarct CVA. 2. Hypertension. 3. Tobacco abuse. 4. Suboptimal LDL cholesterol in the setting of cerebrovascular disease.     Plan: 1. Start simvastatin 20 mg daily. 2. Await echocardiogram. 3. Probable discharge home soon. She will need a primary care physician to follow her.     LOS: 1 day   Wilson Singer Pager [phone]  08/07/2011, 8:02 AM

## 2011-08-07 NOTE — Progress Notes (Signed)
D/c instructions reviewed with patient and daughter.  Verbalized understanding.  Pt dc'd to home with daughter.  Schonewitz, Candelaria Stagers 08/07/2011

## 2011-08-27 ENCOUNTER — Encounter: Payer: Self-pay | Admitting: Family Medicine

## 2011-08-27 ENCOUNTER — Ambulatory Visit (INDEPENDENT_AMBULATORY_CARE_PROVIDER_SITE_OTHER): Payer: Medicare Other | Admitting: Family Medicine

## 2011-08-27 VITALS — BP 130/82 | HR 72 | Resp 16 | Ht 62.0 in | Wt 148.4 lb

## 2011-08-27 DIAGNOSIS — F0150 Vascular dementia without behavioral disturbance: Secondary | ICD-10-CM | POA: Diagnosis not present

## 2011-08-27 DIAGNOSIS — I6350 Cerebral infarction due to unspecified occlusion or stenosis of unspecified cerebral artery: Secondary | ICD-10-CM | POA: Diagnosis not present

## 2011-08-27 DIAGNOSIS — I10 Essential (primary) hypertension: Secondary | ICD-10-CM | POA: Diagnosis not present

## 2011-08-27 DIAGNOSIS — F17200 Nicotine dependence, unspecified, uncomplicated: Secondary | ICD-10-CM

## 2011-08-27 DIAGNOSIS — I639 Cerebral infarction, unspecified: Secondary | ICD-10-CM

## 2011-08-27 DIAGNOSIS — Z720 Tobacco use: Secondary | ICD-10-CM

## 2011-08-27 MED ORDER — ENALAPRIL MALEATE 5 MG PO TABS: 5.0000 mg | ORAL_TABLET | Freq: Every day | ORAL | Status: DC

## 2011-08-27 MED ORDER — SIMVASTATIN 10 MG PO TABS: 20.0000 mg | ORAL_TABLET | Freq: Every day | ORAL | Status: DC

## 2011-08-27 NOTE — Patient Instructions (Signed)
I will set you up for physical therapy/occupational therapy Continue to work on brain teasers Cut your cholesterol pill in half, then pick up the new prescription Continue blood pressure medication Schedule for Mammogram F/U 6 weeks- Physical

## 2011-08-27 NOTE — Assessment & Plan Note (Signed)
There is some concern based on her history and her MRI for early vascular dementia. It is unclear at this time with some of her memory problems if this is due to the stroke versus a vascular dementia. She will exercise her brain as much as possible with puzzles of such. I will also send her to therapy

## 2011-08-27 NOTE — Progress Notes (Signed)
  Subjective:    Patient ID: [name], female    DOB: [birth year], 66 y.o.   MRN: [id number]  HPI Patient here to establish care. No previous primary care provider. She was admitted in May 20 [facility] for her multi-vascular CVA. She was also diagnosed with hypertension. She was started on full dose aspirin and Zocor with an LDL of 90. Medication and history reviewed, she is more of a Scientist, research (medical) and likes to take herbs and vitamins though non currently Quit tobacco 3 weeks ago She feels her coordination is off and she is weaker than before and daughter agrees, would like to go to PT. she describes intense such as putting her head in the wrong part her shirts or putting her clothes on backwards since the stroke  Review of Systems   GEN- denies fatigue, fever, weight loss,+weakness, recent illness HEENT- denies eye drainage, change in vision, nasal discharge, CVS- denies chest pain, palpitations RESP- denies SOB, cough, wheeze ABD- denies N/V, change in stools, abd pain GU- denies dysuria, hematuria, dribbling, incontinence MSK- denies joint pain, muscle aches, injury Neuro- denies headache, dizziness, syncope, seizure activity      Objective:   Physical Exam GEN- NAD, alert and oriented x3 HEENT- PERRL, EOMI, non injected sclera, pink conjunctiva, MMM, oropharynx clear Neck- Supple, CVS- RRR, no murmur RESP-CTAB ABD-NABS,soft, NT,ND EXT- No edema Pulses- Radial, DP- 2+ Neuro- CNII-XII in tact, normal tone, strength slightly decreased right side compared to left, normal sensation Gait- she tends to trial off toward the right at times       Assessment & Plan:

## 2011-08-27 NOTE — Assessment & Plan Note (Addendum)
I will decrease her Zocor 10 mg her goal LDL is below 100. She currently is but will try to get it closer to 70. Will refer her for occupational therapy. Her coordination may be due to recent stroke versus vascular dementia setting in.

## 2011-08-27 NOTE — Assessment & Plan Note (Signed)
Congratulated on cessation. Previously smoked 50 pack year

## 2011-08-27 NOTE — Assessment & Plan Note (Signed)
Continue current medications blood pressure at goal

## 2011-09-20 ENCOUNTER — Ambulatory Visit (INDEPENDENT_AMBULATORY_CARE_PROVIDER_SITE_OTHER): Payer: Medicare Other | Admitting: Family Medicine

## 2011-09-20 ENCOUNTER — Encounter: Payer: Self-pay | Admitting: Family Medicine

## 2011-09-20 VITALS — BP 150/86 | HR 66 | Resp 18 | Ht 62.0 in | Wt 149.0 lb

## 2011-09-20 DIAGNOSIS — N3000 Acute cystitis without hematuria: Secondary | ICD-10-CM | POA: Diagnosis not present

## 2011-09-20 DIAGNOSIS — N760 Acute vaginitis: Secondary | ICD-10-CM | POA: Diagnosis not present

## 2011-09-20 DIAGNOSIS — R319 Hematuria, unspecified: Secondary | ICD-10-CM | POA: Diagnosis not present

## 2011-09-20 LAB — POCT URINALYSIS DIPSTICK
Bilirubin, UA: NEGATIVE
Nitrite, UA: NEGATIVE
pH, UA: 6

## 2011-09-20 MED ORDER — FLUCONAZOLE 150 MG PO TABS: 150.0000 mg | ORAL_TABLET | Freq: Once | ORAL | Status: AC

## 2011-09-20 MED ORDER — CEPHALEXIN 500 MG PO CAPS: 500.0000 mg | ORAL_CAPSULE | Freq: Two times a day (BID) | ORAL | Status: AC

## 2011-09-20 NOTE — Patient Instructions (Addendum)
Start the antibiotics for urine infection Take the yeast pill today and repeat after you finish the antibiotics We will not have results until Monday  Continue your medication Keep previous f/u appointment

## 2011-09-21 LAB — WET PREP BY MOLECULAR PROBE: Gardnerella vaginalis: POSITIVE — AB

## 2011-09-22 ENCOUNTER — Encounter: Payer: Self-pay | Admitting: Family Medicine

## 2011-09-22 DIAGNOSIS — N3000 Acute cystitis without hematuria: Secondary | ICD-10-CM | POA: Insufficient documentation

## 2011-09-22 DIAGNOSIS — N760 Acute vaginitis: Secondary | ICD-10-CM | POA: Insufficient documentation

## 2011-09-22 LAB — URINE CULTURE: Colony Count: 4000

## 2011-09-22 NOTE — Assessment & Plan Note (Signed)
Cultures taken, will give diflucan now and repeat after antibiotics for urine

## 2011-09-22 NOTE — Progress Notes (Signed)
  Subjective:    Patient ID: [name], female    DOB: [birth year], 66 y.o.   MRN: [id number]  HPI Pt presents with vaginal discharge and dysuria for past 2 weeks. She has been taking AZO and cranberry juice. Denies abdominal pain, fever, N/V.   Review of Systems  - per above  GEN- denies fatigue, fever, weight loss,weakness, recent illness CVS- denies chest pain, palpitations RESP- denies SOB, cough, wheeze ABD- denies N/V, change in stools, abd pain GU- +dysuria, denies hematuria, dribbling, incontinence        Objective:   Physical Exam GEN- NAD, alert and orientedx 3 ABD-NABS,soft, NT,ND, no suprapubic tenderness GU- normal external genitalia, vaginal mucosa pink and moist, cervix visualized no growth, no blood form os, + discharge, no CMT, no ovarian masses, uterus normal size        Assessment & Plan:

## 2011-09-22 NOTE — Assessment & Plan Note (Signed)
Antibiotics for acute infection

## 2011-09-23 MED ORDER — METRONIDAZOLE 500 MG PO TABS: 500.0000 mg | ORAL_TABLET | Freq: Two times a day (BID) | ORAL | Status: AC

## 2011-09-23 NOTE — Addendum Note (Signed)
Addended by: Milinda Antis F on: 09/23/2011 12:03 PM   Modules accepted: Orders

## 2011-10-01 DIAGNOSIS — H2510 Age-related nuclear cataract, unspecified eye: Secondary | ICD-10-CM | POA: Diagnosis not present

## 2011-10-10 ENCOUNTER — Other Ambulatory Visit (HOSPITAL_COMMUNITY)
Admission: RE | Admit: 2011-10-10 | Discharge: 2011-10-10 | Disposition: A | Discharge status: Home / Self Care | Payer: Medicare Other | Source: Ambulatory Visit | Attending: Family Medicine | Admitting: Family Medicine

## 2011-10-10 ENCOUNTER — Ambulatory Visit (INDEPENDENT_AMBULATORY_CARE_PROVIDER_SITE_OTHER): Payer: Medicare Other | Admitting: Family Medicine

## 2011-10-10 ENCOUNTER — Encounter: Payer: Self-pay | Admitting: Family Medicine

## 2011-10-10 VITALS — BP 140/90 | HR 66 | Resp 16 | Ht 62.0 in | Wt 147.0 lb

## 2011-10-10 DIAGNOSIS — Z13820 Encounter for screening for osteoporosis: Secondary | ICD-10-CM

## 2011-10-10 DIAGNOSIS — Z124 Encounter for screening for malignant neoplasm of cervix: Secondary | ICD-10-CM | POA: Diagnosis not present

## 2011-10-10 DIAGNOSIS — Z01419 Encounter for gynecological examination (general) (routine) without abnormal findings: Secondary | ICD-10-CM

## 2011-10-10 DIAGNOSIS — K649 Unspecified hemorrhoids: Secondary | ICD-10-CM

## 2011-10-10 DIAGNOSIS — Z1239 Encounter for other screening for malignant neoplasm of breast: Secondary | ICD-10-CM

## 2011-10-10 DIAGNOSIS — I10 Essential (primary) hypertension: Secondary | ICD-10-CM

## 2011-10-10 DIAGNOSIS — N841 Polyp of cervix uteri: Secondary | ICD-10-CM

## 2011-10-10 DIAGNOSIS — Z1211 Encounter for screening for malignant neoplasm of colon: Secondary | ICD-10-CM | POA: Diagnosis not present

## 2011-10-10 DIAGNOSIS — Z23 Encounter for immunization: Secondary | ICD-10-CM | POA: Diagnosis not present

## 2011-10-10 DIAGNOSIS — Z1151 Encounter for screening for human papillomavirus (HPV): Secondary | ICD-10-CM | POA: Diagnosis not present

## 2011-10-10 LAB — POC HEMOCCULT BLD/STL (OFFICE/1-CARD/DIAGNOSTIC): Fecal Occult Blood, POC: NEGATIVE

## 2011-10-10 MED ORDER — IBUPROFEN 800 MG PO TABS: 800.0000 mg | ORAL_TABLET | Freq: Three times a day (TID) | ORAL | Status: AC | PRN

## 2011-10-10 NOTE — Patient Instructions (Addendum)
I recommend eye visit once a year I recommend dental visit every 6 months Goal is to  Exercise 30 minutes 5 days a week We will send a letter with lab results  Please give number for [facility]- order placed already Continue current medication Add Calcium ( 1200mg )  and Vit D ( 800IU)  Mammogram and Bone Density to be done F/U 3 months

## 2011-10-10 NOTE — Progress Notes (Signed)
  Subjective:    Patient ID: [name], female    DOB: 07/09/1945, 66 y.o.   MRN: [id number]  HPI Patient here for CPE with PAP  She has no concerns. Medications and history reviewed Due for colonoscopy Due for Mammogram/Bone Density Due for Pnuemovax She has been seen by eye doctor  No vaginal bleeding    Review of Systems   GEN- denies fatigue, fever, weight loss,weakness, recent illness HEENT- denies eye drainage, change in vision, nasal discharge, CVS- denies chest pain, palpitations RESP- denies SOB, cough, wheeze ABD- denies N/V, change in stools, abd pain GU- denies dysuria, hematuria, dribbling, incontinence MSK- denies joint pain, muscle aches, injury Neuro- denies headache, dizziness, syncope, seizure activity      Objective:   Physical Exam GEN- NAD, alert and oriented x 3  Neck- supple, no thyromegaly Breast- normal symmetry, no nipple inversion,no nipple drainage, no nodules or lumps felt Nodes- no axillary nodes CVS-RRR, no murmur RESP-CTAB GU- normal external genitalia, vaginal mucosa pink and moist, small cervical polyp noted at os, no blood form os, no discharge, no CMT, no ovarian masses, uterus normal size, Rectal- normal tone, small easily reduced hemorroid, FOBT negative        Assessment & Plan:   CPE- PAP Smear done, send for Mammogram/bone density/colonoscopy       Pneumovaccine given, Calcium Vit D  See instructions

## 2011-10-10 NOTE — Assessment & Plan Note (Signed)
Improved, no change to meds

## 2011-10-10 NOTE — Addendum Note (Signed)
Addended by: Abner Greenspan on: 10/10/2011 04:00 PM   Modules accepted: Orders

## 2011-10-10 NOTE — Addendum Note (Signed)
Addended by: Abner Greenspan on: 10/10/2011 03:51 PM   Modules accepted: Orders

## 2011-10-10 NOTE — Assessment & Plan Note (Signed)
Refer to GYN for removal

## 2011-10-18 ENCOUNTER — Telehealth: Payer: Self-pay | Admitting: Family Medicine

## 2011-10-21 ENCOUNTER — Encounter: Payer: Self-pay | Admitting: Family Medicine

## 2011-10-22 ENCOUNTER — Ambulatory Visit (HOSPITAL_COMMUNITY)
Admission: RE | Admit: 2011-10-22 | Discharge: 2011-10-22 | Disposition: A | Discharge status: Home / Self Care | Payer: Medicare Other | Source: Ambulatory Visit | Attending: Family Medicine | Admitting: Family Medicine

## 2011-10-22 DIAGNOSIS — Z13820 Encounter for screening for osteoporosis: Secondary | ICD-10-CM | POA: Insufficient documentation

## 2011-10-22 DIAGNOSIS — Z1231 Encounter for screening mammogram for malignant neoplasm of breast: Secondary | ICD-10-CM | POA: Insufficient documentation

## 2011-10-22 DIAGNOSIS — Z780 Asymptomatic menopausal state: Secondary | ICD-10-CM | POA: Insufficient documentation

## 2011-10-22 DIAGNOSIS — M899 Disorder of bone, unspecified: Secondary | ICD-10-CM | POA: Diagnosis not present

## 2011-10-22 DIAGNOSIS — E559 Vitamin D deficiency, unspecified: Secondary | ICD-10-CM | POA: Diagnosis not present

## 2011-10-22 DIAGNOSIS — Z1239 Encounter for other screening for malignant neoplasm of breast: Secondary | ICD-10-CM

## 2011-10-23 ENCOUNTER — Other Ambulatory Visit: Payer: Self-pay

## 2011-10-23 ENCOUNTER — Encounter: Payer: Self-pay | Admitting: Urgent Care

## 2011-10-23 ENCOUNTER — Ambulatory Visit (INDEPENDENT_AMBULATORY_CARE_PROVIDER_SITE_OTHER): Payer: Medicare Other | Admitting: Urgent Care

## 2011-10-23 VITALS — BP 154/74 | HR 65 | Temp 98.3°F | Ht 60.0 in | Wt 144.4 lb

## 2011-10-23 DIAGNOSIS — K219 Gastro-esophageal reflux disease without esophagitis: Secondary | ICD-10-CM

## 2011-10-23 DIAGNOSIS — K921 Melena: Secondary | ICD-10-CM

## 2011-10-23 DIAGNOSIS — R634 Abnormal weight loss: Secondary | ICD-10-CM

## 2011-10-23 DIAGNOSIS — K649 Unspecified hemorrhoids: Secondary | ICD-10-CM

## 2011-10-23 MED ORDER — HYDROCORTISONE ACETATE 25 MG RE SUPP: 25.0000 mg | Freq: Two times a day (BID) | RECTAL | Status: AC

## 2011-10-23 NOTE — Assessment & Plan Note (Signed)
Gradual unintentional 5# weight loss & early satiety with chronic GERD s/p CVA.  Omeprazole helps with GERD, however pt still not eating much.  This may be due to recent CVA but EGD to look for PUD, gastritis or less likely malignancy.  I have discussed risks & benefits which include, but are not limited to, bleeding, infection, perforation & drug reaction.  The patient agrees with this plan & written consent will be obtained.

## 2011-10-23 NOTE — Telephone Encounter (Signed)
Patient is aware 

## 2011-10-23 NOTE — Assessment & Plan Note (Addendum)
[name] is a pleasant 66 y.o. female with chronic small volume hematochezia.  She will need colonoscopy to determine source including benign anorectal source such as hemorrhoids, colorectal polyp or carcinoma.  I have discussed risks & benefits which include, but are not limited to, bleeding, infection, perforation & drug reaction.  The patient agrees with this plan & written consent will be obtained.     Anusol HC BID x 10days

## 2011-10-23 NOTE — Assessment & Plan Note (Signed)
Chronic.  On prilosec 20mg  daily.

## 2011-10-23 NOTE — Progress Notes (Signed)
Referring Provider: Salley Scarlet, MD Primary Care Physician:  Milinda Antis, MD Primary Gastroenterologist:  Dr. Jonette Eva  Chief Complaint  Patient presents with  . Colonoscopy    HPI:  [name] is a 66 y.o. female here as a referral from Dr. Jeanice Lim for colonoscopy.  An office appt was made as she has concerns about hemorrhoids.  C/o chronic hemorrhoids for years & intermittent scant hematochezia.    Denies constipation or diarrhea.  Hx chronic GERD on prilosec 20mg  daily.  Denies dysphagia or odynophagia.  Weight loss 5# in past few months since CVA.  Daughter states she eats like a bird & pt states she is trying to diet  & be healthy since her CVA.  Daughter is concerned that he mother is not eating much & having daily heartburn.  Much improved w/ prilosec. Past Medical History  Diagnosis Date  . Hypertension   . GERD (gastroesophageal reflux disease)   . Stroke May 2013  . Hyperlipidemia     Past Surgical History  Procedure Date  . Bunionectomy     Current Outpatient Prescriptions  Medication Sig Dispense Refill  . aspirin 325 MG tablet Take 1 tablet (325 mg total) by mouth daily.  30 tablet  0  . enalapril (VASOTEC) 5 MG tablet Take 1 tablet (5 mg total) by mouth daily.  30 tablet  3  . omeprazole (PRILOSEC) 20 MG capsule Take 20 mg by mouth daily.      . penicillin v potassium (VEETID) 500 MG tablet Take 500 mg by mouth 4 (four) times daily.      . simvastatin (ZOCOR) 10 MG tablet Take 10 mg by mouth daily at 6 PM.      . hydrocortisone (ANUSOL-HC) 25 MG suppository Place 1 suppository (25 mg total) rectally every 12 (twelve) hours.  20 suppository  0    Allergies as of 10/23/2011  . (No Known Allergies)    Family History:There is no known family history of colorectal carcinoma , liver disease, or inflammatory bowel disease.  Problem Relation Age of Onset  . Stroke Mother   . Diabetes Mother   . Hypertension Mother   . Hyperlipidemia Mother   .  Hypertension Father   . Hyperlipidemia Father     History   Social History  . Marital Status: Divorced    Spouse Name: N/A    Number of Children: N/A  . Years of Education: N/A   Occupational History  . Not on file.   Social History Main Topics  . Smoking status: Former Smoker -- 1.0 packs/day for 50 years    Types: Cigarettes  . Smokeless tobacco: Never Used   Comment: hasn't smoked in 3 weeks   . Alcohol Use: No  . Drug Use: No  . Sexually Active: Not on file   Review of Systems: Gen: Denies any fever, chills, sweats, fatigue, weakness, malaise, and sleep disorder CV: Denies chest pain, angina, palpitations, syncope, orthopnea, PND, peripheral edema, and claudication. Resp: Denies dyspnea at rest, dyspnea with exercise, cough, sputum, wheezing, coughing up blood, and pleurisy. GI: Denies vomiting blood, jaundice, and fecal incontinence.   GU : Denies urinary burning, blood in urine, urinary frequency, urinary hesitancy, nocturnal urination, and urinary incontinence. MS: Residual weakness in hands s/p CVA.  Denies joint pain, limitation of movement, and swelling, stiffness, low back pain, extremity pain.  Derm: Denies rash, itching, dry skin, hives, moles, warts, or unhealing ulcers.  Psych: Denies depression, anxiety, memory loss, suicidal ideation,  hallucinations, paranoia, and confusion. Heme: Denies bruising, bleeding, and enlarged lymph nodes. Neuro:  Denies any headaches, dizziness, paresthesias. Endo:  Denies any problems with DM, thyroid, adrenal function.  Physical Exam: BP 154/74  Pulse 65  Temp 98.3 F (36.8 C) (Temporal)  Ht 5' (1.524 m)  Wt 144 lb 6.4 oz (65.499 kg)  BMI 28.20 kg/m2 General:   Alert,  Well-developed, well-nourished, pleasant and cooperative in NAD Head:  Normocephalic and atraumatic. Eyes:  Sclera clear, no icterus.   Conjunctiva pink. Ears:  Normal auditory acuity. Nose:  No deformity, discharge, or lesions. Mouth:  No deformity or  lesions,oropharynx pink & moist. Neck:  Supple; no masses or thyromegaly. Lungs:  Clear throughout to auscultation.   No wheezes, crackles, or rhonchi. No acute distress. Heart:  Regular rate and rhythm; no murmurs, clicks, rubs,  or gallops. Abdomen:  Normal bowel sounds.  No bruits.  Soft, non-tender and non-distended without masses, hepatosplenomegaly or hernias noted.  No guarding or rebound tenderness.   Rectal:  Deferred until time of colonoscopy. Msk:  Symmetrical without gross deformities. Normal posture. Pulses:  Normal pulses noted. Extremities:  No clubbing or edema. Neurologic:  Alert and oriented x4;  grossly normal neurologically. Skin:  Intact without significant lesions or rashes. Lymph Nodes:  No significant cervical adenopathy. Psych:  Alert and cooperative. Normal mood and affect.

## 2011-10-23 NOTE — Patient Instructions (Addendum)
EGD &Colonoscopy w/ Dr Val Riles twice daily for 10 days

## 2011-10-23 NOTE — Progress Notes (Signed)
Faxed to PCP

## 2011-10-29 ENCOUNTER — Ambulatory Visit (HOSPITAL_COMMUNITY)
Admission: RE | Admit: 2011-10-29 | Discharge: 2011-10-29 | Disposition: A | Discharge status: Home / Self Care | Payer: Medicare Other | Source: Ambulatory Visit | Attending: Family Medicine | Admitting: Family Medicine

## 2011-10-29 DIAGNOSIS — E785 Hyperlipidemia, unspecified: Secondary | ICD-10-CM | POA: Diagnosis not present

## 2011-10-29 DIAGNOSIS — R279 Unspecified lack of coordination: Secondary | ICD-10-CM | POA: Insufficient documentation

## 2011-10-29 DIAGNOSIS — I10 Essential (primary) hypertension: Secondary | ICD-10-CM | POA: Insufficient documentation

## 2011-10-29 DIAGNOSIS — M6281 Muscle weakness (generalized): Secondary | ICD-10-CM | POA: Diagnosis not present

## 2011-10-29 DIAGNOSIS — IMO0001 Reserved for inherently not codable concepts without codable children: Secondary | ICD-10-CM | POA: Insufficient documentation

## 2011-10-29 DIAGNOSIS — I69998 Other sequelae following unspecified cerebrovascular disease: Secondary | ICD-10-CM | POA: Insufficient documentation

## 2011-10-29 NOTE — Evaluation (Signed)
Physical Therapy Evaluation  Patient Details  Name: Amariona Rathje MRN: [id number] Date of Birth: 1945-05-16  Today's Date: 10/29/2011 Time: 1515-1600 PT Time Calculation (min): 45 min  Visit#: 1  of 4   Re-eval: 11/28/11 Assessment Diagnosis: CVA Prior Therapy: none  Authorization: medicare   Past Medical History:  Past Medical History  Diagnosis Date  . Hypertension   . GERD (gastroesophageal reflux disease)   . Stroke May 2013  . Hyperlipidemia    Past Surgical History:  Past Surgical History  Procedure Date  . Bunionectomy     Subjective Symptoms/Limitations Symptoms: Ms. Elson states that she had a stroke in May; she was admitted to the hospital for three days.  She was discharged without any therapy but she still has not got back to where she wants to be.  She states she is having difficulty ironing, doing her hair  and picking her feet up. How long can you stand comfortably?: The patient states she has no problem with standing. How long can you walk comfortably?: No difficulty with walking. Patient Stated Goals: To be back to normal. Pain Assessment Currently in Pain?: No/denies  Precautions/Restrictions     Prior Functioning  Home Living Lives With: Family Prior Function Level of Independence: Independent with basic ADLs  Cognition/Observation Cognition Overall Cognitive Status: Appears within functional limits for tasks assessed  Sensation/Coordination/Flexibility/Functional Tests Functional Tests Functional Tests: time to flip 7 coins R 10 L 20 seconds.  Assessment RUE Strength Right Shoulder Flexion: 4/5 Right Shoulder Extension:  (4+) Right Shoulder ABduction: 5/5 Right Shoulder Internal Rotation: 4/5 Right Shoulder External Rotation: 3/5 Right Elbow Flexion: 5/5 Right Elbow Extension: 5/5 Right Wrist Flexion: 5/5 Right Wrist Extension: 5/5 Grip (lbs): 55 LUE Strength Left Shoulder Flexion: 5/5 Left Shoulder Extension: 5/5 Left Shoulder  ABduction: 5/5 Left Shoulder Internal Rotation: 5/5 Left Shoulder External Rotation: 4/5 Left Elbow Flexion: 5/5 Left Elbow Extension: 5/5 Left Wrist Flexion: 5/5 Left Wrist Extension: 5/5 Grip (lbs): 45 RLE Strength Right Hip Flexion: 4/5 Right Hip Extension: 3/5 Right Hip ABduction: 3+/5 Right Hip ADduction: 4/5 Right Knee Flexion: 4/5 Right Knee Extension: 5/5 Right Ankle Dorsiflexion: 5/5 LLE Strength Left Hip Flexion: 5/5 Left Hip Extension: 4/5 Left Hip ABduction: 4/5 Left Hip ADduction: 4/5 Left Knee Flexion: 5/5 Left Knee Extension: 5/5 Left Ankle Dorsiflexion: 5/5  Exercise/Treatments   Seated Flexion: AROM;Both;10 reps Other Seated Exercises: putty for hand grip; turn coins over .   Sidelying External Rotation: AROM;Right;10 reps   Physical Therapy Assessment and Plan PT Assessment and Plan Clinical Impression Statement: Pt s/p CVA; Pt has both UE and LE deficits but complaints are of UE ie doing hair, dressing.  Pt will benefit from skilled PT for coordination and strengthening ex to be able to complete ADL's Pt will benefit from skilled therapeutic intervention in order to improve on the following deficits: Impaired UE functional use;Decreased coordination;Decreased strength Rehab Potential: Good PT Frequency: Min 1X/week PT Duration: 4 weeks PT Plan: Pt given HEP; pt to work on L hand strength and coordination, R shld flex/ER strength,  Next treatment  T-band, have pt do her hair, fold towels, fine motor activity at OT table.    Goals Home Exercise Program Pt will Perform Home Exercise Program: Independently PT Short Term Goals Time to Complete Short Term Goals: 2 weeks PT Short Term Goal 1: Pt to state that it is 50% easier to fold laundry PT Short Term Goal 2: Pt to state that it is 50% easier to  put hair up.  Problem List Patient Active Problem List  Diagnosis  . CVA (cerebrovascular accident)  . HTN (hypertension)  . Tobacco abuse  . Vascular  dementia  . Acute cystitis  . Vaginitis  . Cervical polyp  . Hematochezia  . Weight loss  . GERD (gastroesophageal reflux disease)    PT - End of Session Activity Tolerance: Patient tolerated treatment well General Behavior During Session: [facility] for tasks performed Cognition: [facility] for tasks performed PT Plan of Care PT Home Exercise Plan: given  GP Functional Assessment Tool Used: Pt report of unable to do hair, dress,  Functional Limitation: Self care Self Care Current Status (Z6109): At least 40 percent but less than 60 percent impaired, limited or restricted Self Care Goal Status (U0454): At least 1 percent but less than 20 percent impaired, limited or restricted  RUSSELL,CINDY 10/29/2011, 4:36 PM  Physician Documentation Your signature is required to indicate approval of the treatment plan as stated above.  Please sign and either send electronically or make a copy of this report for your files and return this physician signed original.   Please mark one 1.__approve of plan  2. ___approve of plan with the following conditions.   ______________________________                                                          _____________________ Physician Signature                                                                                                             Date

## 2011-10-31 ENCOUNTER — Encounter (HOSPITAL_COMMUNITY)
Admission: RE | Disposition: A | Discharge status: Home / Self Care | Payer: Self-pay | Source: Ambulatory Visit | Attending: Gastroenterology

## 2011-10-31 ENCOUNTER — Ambulatory Visit (HOSPITAL_COMMUNITY)
Admission: RE | Admit: 2011-10-31 | Discharge: 2011-10-31 | Disposition: A | Discharge status: Home / Self Care | Payer: Medicare Other | Source: Ambulatory Visit | Attending: Gastroenterology | Admitting: Gastroenterology

## 2011-10-31 ENCOUNTER — Encounter (HOSPITAL_COMMUNITY): Payer: Self-pay | Admitting: *Deleted

## 2011-10-31 DIAGNOSIS — R634 Abnormal weight loss: Secondary | ICD-10-CM | POA: Insufficient documentation

## 2011-10-31 DIAGNOSIS — D128 Benign neoplasm of rectum: Secondary | ICD-10-CM | POA: Diagnosis not present

## 2011-10-31 DIAGNOSIS — Z1211 Encounter for screening for malignant neoplasm of colon: Secondary | ICD-10-CM | POA: Diagnosis not present

## 2011-10-31 DIAGNOSIS — K921 Melena: Secondary | ICD-10-CM

## 2011-10-31 DIAGNOSIS — K2990 Gastroduodenitis, unspecified, without bleeding: Secondary | ICD-10-CM | POA: Diagnosis not present

## 2011-10-31 DIAGNOSIS — K648 Other hemorrhoids: Secondary | ICD-10-CM | POA: Insufficient documentation

## 2011-10-31 DIAGNOSIS — K2940 Chronic atrophic gastritis without bleeding: Secondary | ICD-10-CM | POA: Insufficient documentation

## 2011-10-31 DIAGNOSIS — K219 Gastro-esophageal reflux disease without esophagitis: Secondary | ICD-10-CM

## 2011-10-31 DIAGNOSIS — K449 Diaphragmatic hernia without obstruction or gangrene: Secondary | ICD-10-CM | POA: Insufficient documentation

## 2011-10-31 DIAGNOSIS — E785 Hyperlipidemia, unspecified: Secondary | ICD-10-CM | POA: Insufficient documentation

## 2011-10-31 DIAGNOSIS — K3189 Other diseases of stomach and duodenum: Secondary | ICD-10-CM | POA: Insufficient documentation

## 2011-10-31 DIAGNOSIS — I10 Essential (primary) hypertension: Secondary | ICD-10-CM | POA: Diagnosis not present

## 2011-10-31 DIAGNOSIS — D126 Benign neoplasm of colon, unspecified: Secondary | ICD-10-CM | POA: Insufficient documentation

## 2011-10-31 DIAGNOSIS — K5730 Diverticulosis of large intestine without perforation or abscess without bleeding: Secondary | ICD-10-CM

## 2011-10-31 DIAGNOSIS — K649 Unspecified hemorrhoids: Secondary | ICD-10-CM

## 2011-10-31 DIAGNOSIS — K2970 Gastritis, unspecified, without bleeding: Secondary | ICD-10-CM

## 2011-10-31 HISTORY — PX: ESOPHAGOGASTRODUODENOSCOPY: SHX1529

## 2011-10-31 HISTORY — PX: COLONOSCOPY: SHX174

## 2011-10-31 SURGERY — COLONOSCOPY WITH ESOPHAGOGASTRODUODENOSCOPY (EGD)
Anesthesia: Moderate Sedation

## 2011-10-31 MED ORDER — SODIUM CHLORIDE 0.45 % IV SOLN
Freq: Once | INTRAVENOUS | Status: AC
  Administered 2011-10-31: 10:00:00 via INTRAVENOUS

## 2011-10-31 MED ORDER — STERILE WATER FOR IRRIGATION IR SOLN
Status: DC | PRN
  Administered 2011-10-31: 11:00:00

## 2011-10-31 MED ORDER — MIDAZOLAM HCL 5 MG/5ML IJ SOLN
INTRAMUSCULAR | Status: DC | PRN
  Administered 2011-10-31: 2 mg via INTRAVENOUS
  Administered 2011-10-31 (×2): 1 mg via INTRAVENOUS

## 2011-10-31 MED ORDER — MEPERIDINE HCL 100 MG/ML IJ SOLN
INTRAMUSCULAR | Status: AC
  Filled 2011-10-31: qty 1

## 2011-10-31 MED ORDER — BUTAMBEN-TETRACAINE-BENZOCAINE 2-2-14 % EX AERO
INHALATION_SPRAY | CUTANEOUS | Status: DC | PRN
  Administered 2011-10-31: 2 via TOPICAL

## 2011-10-31 MED ORDER — HYDRALAZINE HCL 20 MG/ML IJ SOLN
INTRAMUSCULAR | Status: AC
  Filled 2011-10-31: qty 1

## 2011-10-31 MED ORDER — MEPERIDINE HCL 100 MG/ML IJ SOLN
INTRAMUSCULAR | Status: DC | PRN
  Administered 2011-10-31 (×2): 25 mg via INTRAVENOUS

## 2011-10-31 MED ORDER — MIDAZOLAM HCL 5 MG/5ML IJ SOLN
INTRAMUSCULAR | Status: AC
  Filled 2011-10-31: qty 10

## 2011-10-31 NOTE — Op Note (Signed)
[facility] [address] [place], [place]  [address]  ENDOSCOPY PROCEDURE REPORT  PATIENT:  Shannon, [name]  MR#:  [id number] BIRTHDATE:  Dec 24, 1945, 66 yrs. old  GENDER:  female  ENDOSCOPIST:  Jonette Eva, MD Referred by:  Milinda Antis, M.D.  PROCEDURE DATE:  10/31/2011 PROCEDURE:  EGD with biopsy, 43239 ASA CLASS: INDICATIONS:  WEIGHT LOSS, NEW ONSET DYSPEPSIA  MEDICATIONS:   Demerol 25 mg IV, Versed 1 mg IV TOPICAL ANESTHETIC:  Cetacaine Spray  DESCRIPTION OF PROCEDURE:     Physical exam was performed. Informed consent was obtained from the patient after explaining the benefits, risks, and alternatives to the procedure.  The patient was connected to the monitor and placed in the left lateral position.  Continuous oxygen was provided by nasal cannula and IV medicine administered through an indwelling cannula.  After administration of sedation, the patient's esophagus was intubated and the EG-[id number] ([id number]) endoscope was advanced under direct visualization to the second portion of the duodenum.  The scope was removed slowly by carefully examining the color, texture, anatomy, and integrity of the mucosa on the way out.  The patient was recovered in endoscopy and discharged home in satisfactory condition. <<PROCEDUREIMAGES>>  NL ESOPHAGUS. A 4-5 CM  hiatal hernia was found.  Mild gastritis was found & BIOPSIED VIA COLD FORCEPS. NL DUODENUM.  COMPLICATIONS:    None  ENDOSCOPIC IMPRESSION: 1) LARGE Hiatal hernia 2) Mild gastritis  RECOMMENDATIONS: AWAIT BIOPSY LOW FAT DIET OMP 30 MINUTES PRIOR TO FIRST MEAL OPV IN 2 WEEKS  REPEAT EXAM:  No  ______________________________ Jonette Eva, MD  CC:  n. eSIGNED:   Sandi Fields at 10/31/2011 12:05 PM  Laretta Bolster, [phone]

## 2011-10-31 NOTE — H&P (Addendum)
  Primary Care Physician:  Milinda Antis, MD Primary Gastroenterologist:  Dr. Darrick Penna  Pre-Procedure History & Physical: HPI:  [name] is a 66 y.o. female here for  SCREENING & WEIGHT LOSS. CHANGED DIET. HAS RECTAL PAIN/PRESSURE AND USES CREAM EVERY DAY.  Past Medical History  Diagnosis Date  . Hypertension   . GERD (gastroesophageal reflux disease)   . Stroke May 2013  . Hyperlipidemia     Past Surgical History  Procedure Date  . Bunionectomy     Prior to Admission medications   Medication Sig Start Date End Date Taking? Authorizing Provider  aspirin 325 MG tablet Take 1 tablet (325 mg total) by mouth daily. 08/07/11 08/06/12 Yes Nimish Normajean Glasgow, MD  enalapril (VASOTEC) 5 MG tablet Take 1 tablet (5 mg total) by mouth daily. 08/27/11 08/26/12 Yes Salley Scarlet, MD  hydrocortisone (ANUSOL-HC) 25 MG suppository Place 1 suppository (25 mg total) rectally every 12 (twelve) hours. 10/23/11 11/02/11 Yes Joselyn Arrow, NP  omeprazole (PRILOSEC) 20 MG capsule Take 20 mg by mouth daily.   Yes Historical Provider, MD  penicillin v potassium (VEETID) 500 MG tablet Take 500 mg by mouth 4 (four) times daily.   Yes Historical Provider, MD  simvastatin (ZOCOR) 10 MG tablet Take 10 mg by mouth daily at 6 PM. 08/27/11 08/26/12 Yes Salley Scarlet, MD    Allergies as of 10/23/2011  . (No Known Allergies)    Family History  Problem Relation Age of Onset  . Stroke Mother   . Diabetes Mother   . Hypertension Mother   . Hyperlipidemia Mother   . Hypertension Father   . Hyperlipidemia Father   . Colon cancer Neg Hx     History   Social History  . Marital Status: Divorced    Spouse Name: N/A    Number of Children: N/A  . Years of Education: N/A   Occupational History  . Not on file.   Social History Main Topics  . Smoking status: Former Smoker -- 1.0 packs/day for 50 years    Types: Cigarettes  . Smokeless tobacco: Never Used   Comment: hasn't smoked in 3 weeks   . Alcohol Use: No   . Drug Use: No  . Sexually Active: Not on file   Other Topics Concern  . Not on file   Social History Narrative  . No narrative on file    Review of Systems: See HPI, otherwise negative ROS   Physical Exam: BP 166/92  Pulse 74  Temp 98.1 F (36.7 C) (Oral)  Resp 13  Ht 5' (1.524 m)  Wt 144 lb (65.318 kg)  BMI 28.12 kg/m2  SpO2 100% General:   Alert,  pleasant and cooperative in NAD Head:  Normocephalic and atraumatic. Neck:  Supple; Lungs:  Clear throughout to auscultation.    Heart:  Regular rate and rhythm. Abdomen:  Soft, nontender and nondistended. Normal bowel sounds, without guarding, and without rebound.   Neurologic:  Alert and  oriented x4;  grossly normal neurologically.  Impression/Plan:     SCREENING/WEIGHTLOSS-PT DENIES SEEING RECTAL BLEEDING IN YEARS  PLAN: EGD/TCS/?BANDING TODAY

## 2011-10-31 NOTE — Op Note (Addendum)
[facility] [address] [place], [place]  [address]  COLONOSCOPY PROCEDURE REPORT  PATIENT:  [name], [name]  MR#:  [id number] BIRTHDATE:  Nov 10, 1945, 66 yrs. old  GENDER:  female  ENDOSCOPIST:  Jonette Eva, MD REF. BY:  Milinda Antis, M.D. ASSISTANT:  PROCEDURE DATE:  10/31/2011 PROCEDURE:  Colonoscopy with biopsy, Colon w/ banding hemorrhoid  INDICATIONS:  SCREENING-RECTAL PAIN AND PRESSURE  MEDICATIONS:   Demerol 25 mg IV, Versed 3 mg IV  DESCRIPTION OF PROCEDURE:    Physical exam was performed. Informed consent was obtained from the patient after explaining the benefits, risks, and alternatives to procedure.  The patient was connected to monitor and placed in left lateral position. Continuous oxygen was provided by nasal cannula and IV medicine administered through an indwelling cannula.  After administration of sedation and rectal exam, the patient's rectum was intubated and the EC-[id number] ([id number]) and EG-2990i ([id number]) colonoscope was advanced under direct visualization to the cecum.  The scope was removed slowly by carefully examining the color, texture, anatomy, and integrity mucosa on the way out.  The patient was recovered in endoscopy and discharged home in satisfactory condition. <<PROCEDUREIMAGES>>  FINDINGS:  There WAS ONE 8 MM  SESSILE polyp identified and removed. in the ascending colon VIA SNARE CAUTERY.  There were FOUR 3-4 MM  SESSILE polyps identified and removed. in the descending colon DESCENDING COLON.  There were TWO 3-4 MM  SESSILE polyps identified and removed. in the sigmoid colon.  There were five 3-6 MM SESSILE POLYPSpolyps identified and removed. in the rectum. POLyPS REMOVED VIA SNARE CAUTERY/COLD FORCEPS. REDUNDANT TC/[place].  RARE DIVERTICULA THROUGHOUT COLON. LARGE Internal Hemorrhoids were found.  PREP QUALITY:    GOOD CECAL W/D TIME:      29 minutes  COMPLICATIONS:    None  ENDOSCOPIC IMPRESSION: 1) ADENOMATOUS APPEARING  Polyp in the ascending colon 2) Polyps, multiple in the descending colon 3) Polyps, multiple in the sigmoid colon 4) Polyps, multiple in the rectum 5) LARGE Internal hemorrhoids, S/P BANDING  RECOMMENDATIONS: AWAIT BIOPSY LOW RESIDUE DIET FOR 2 WEEKS THEN HIGH FIBER DIET COLACE BID DRINK WATER OPV IN 2 WEEKS MAY CONTINUE HEMORRHOID CREAM CALL FOR ABD PAIN, BLEEDING, OR DIFFICULTY URINATING TCS IN 10 YEARS WITH AN OVERTUBE/1 HR TIME SLOT  REPEAT EXAM:  No  ______________________________ Jonette Eva, MD  CC:  Milinda Antis, M.D.  n. REVISED:  10/31/2011 12:58 PM eSIGNED:   Duncan Dull Meli Faley at 10/31/2011 12:58 PM  Laretta Bolster, [phone]

## 2011-11-04 ENCOUNTER — Telehealth: Payer: Self-pay | Admitting: Family Medicine

## 2011-11-04 NOTE — Telephone Encounter (Signed)
Ok to send in fluconazole?

## 2011-11-05 MED ORDER — FLUCONAZOLE 150 MG PO TABS: 150.0000 mg | ORAL_TABLET | Freq: Once | ORAL | Status: AC

## 2011-11-05 NOTE — Telephone Encounter (Signed)
Ok to give fluconazole, 150mg  Po x 1, with 1 refill

## 2011-11-06 ENCOUNTER — Inpatient Hospital Stay (HOSPITAL_COMMUNITY): Admission: RE | Admit: 2011-11-06 | Payer: Medicare Other | Source: Ambulatory Visit | Admitting: Physical Therapy

## 2011-11-07 ENCOUNTER — Telehealth: Payer: Self-pay | Admitting: Gastroenterology

## 2011-11-07 ENCOUNTER — Encounter: Payer: Self-pay | Admitting: Family Medicine

## 2011-11-07 NOTE — Telephone Encounter (Signed)
app. made, recall made

## 2011-11-07 NOTE — Telephone Encounter (Signed)
Pt informed. Her OV is 11/20/2011 @ 8:30 AM.

## 2011-11-07 NOTE — Telephone Encounter (Signed)
Please call pt. She had simple adenomas removed from her colon. HER stomach Bx shows mild gastritis. Continue OMEPRAZOLE. OPV AUG 20. TCS in 10 years.

## 2011-11-13 ENCOUNTER — Ambulatory Visit (HOSPITAL_COMMUNITY)
Admission: RE | Admit: 2011-11-13 | Discharge: 2011-11-13 | Disposition: A | Discharge status: Home / Self Care | Payer: Medicare Other | Source: Ambulatory Visit | Attending: Family Medicine | Admitting: Family Medicine

## 2011-11-13 DIAGNOSIS — R279 Unspecified lack of coordination: Secondary | ICD-10-CM | POA: Insufficient documentation

## 2011-11-13 DIAGNOSIS — I69998 Other sequelae following unspecified cerebrovascular disease: Secondary | ICD-10-CM | POA: Insufficient documentation

## 2011-11-13 DIAGNOSIS — M6281 Muscle weakness (generalized): Secondary | ICD-10-CM | POA: Insufficient documentation

## 2011-11-13 DIAGNOSIS — E785 Hyperlipidemia, unspecified: Secondary | ICD-10-CM | POA: Diagnosis not present

## 2011-11-13 DIAGNOSIS — IMO0001 Reserved for inherently not codable concepts without codable children: Secondary | ICD-10-CM | POA: Diagnosis not present

## 2011-11-13 DIAGNOSIS — I10 Essential (primary) hypertension: Secondary | ICD-10-CM | POA: Insufficient documentation

## 2011-11-13 NOTE — Progress Notes (Signed)
Physical Therapy Treatment Patient Details  Name: Kelsee Preslar MRN: [id number] Date of Birth: 10-Sep-1945  Today's Date: 11/13/2011 Time: 4259-5638 PT Time Calculation (min): 29 min  Visit#: 2  of 4   Re-eval: 11/28/11    Authorization: medicare  Authorization Time Period:    Authorization Visit#:   of     Subjective: Symptoms/Limitations Symptoms: Pt states she has been doing her exercises including shuffling cards.   Pain Assessment Currently in Pain?: No/denies   Exercise/Treatments   Seated Other Seated Exercises: using rubberband to tie washcloth and pillow case as in ponytail   Standing External Rotation: Strengthening;Right;10 reps;Theraband Theraband Level (Shoulder External Rotation): Level 3 (Green) Extension: Both;10 reps;Theraband Theraband Level (Shoulder Extension): Level 3 (Green) Row: 10 reps;Theraband Retraction: Strengthening;Both;Theraband Other Standing Exercises: Putty flatten,roll, pinch    Physical Therapy Assessment and Plan PT Assessment and Plan Clinical Impression Statement: Pt appears to have the strength it is more coordination that is preventing her from doing her hair.  Worked on hand exercises PT Plan: begin nut/bolt board; perdue pegboard next treatment.        Problem List Patient Active Problem List  Diagnosis  . CVA (cerebrovascular accident)  . HTN (hypertension)  . Tobacco abuse  . Vascular dementia  . Acute cystitis  . Vaginitis  . Cervical polyp  . Hematochezia  . Weight loss  . GERD (gastroesophageal reflux disease)    PT - End of Session Activity Tolerance: Patient tolerated treatment well General Behavior During Session: [facility] for tasks performed Cognition: [facility] for tasks performed  GP    Arrianna Catala,CINDY 11/13/2011, 2:33 PM

## 2011-11-19 ENCOUNTER — Encounter: Payer: Self-pay | Admitting: Gastroenterology

## 2011-11-19 ENCOUNTER — Ambulatory Visit: Payer: Medicare Other | Admitting: Urgent Care

## 2011-11-20 ENCOUNTER — Encounter: Payer: Self-pay | Admitting: Gastroenterology

## 2011-11-20 ENCOUNTER — Ambulatory Visit (INDEPENDENT_AMBULATORY_CARE_PROVIDER_SITE_OTHER): Payer: Medicare Other | Admitting: Gastroenterology

## 2011-11-20 VITALS — BP 150/80 | HR 68 | Temp 97.4°F | Ht 60.0 in | Wt 144.6 lb

## 2011-11-20 DIAGNOSIS — K921 Melena: Secondary | ICD-10-CM | POA: Diagnosis not present

## 2011-11-20 NOTE — Progress Notes (Signed)
  Subjective:    Patient ID: [name], female    DOB: 25-Jun-1945, 66 y.o.   MRN: [id number]  PCP: [facility]  HPI LAST SEEN AUG 2013 FOR EGD/TCS/HEMORRHOID BANDING. Having MORE LIKE A BURNING SENSATION: 3/10. FELT LIKE IT WASN'T THERE BEFORE. NO FEVER, OR DIFFICULTY URINATING. BEEN USING PREP H CREAM AND SUPP. CREAM MAKES IT BETTER. BMS: ONCE A DAY. bEEN USING STOOL SOFTENERS AND ITS COMES OUT EASILY.  Past Medical History  Diagnosis Date  . Hypertension   . GERD (gastroesophageal reflux disease)   . Stroke May 2013  . Hyperlipidemia    Past Surgical History  Procedure Date  . Bunionectomy   . Colonoscopy 10/31/2011     LARGE Internal hemorrhoids, S/P BANDING/ ADENOMATOUS APPEARING Polyp in the ascending colon/ multiple polyps in the  descending, sigmoid   colon and rectum  . Esophagogastroduodenoscopy 10/31/2011    LARGE Hiatal hernia/Mild gastritis    No Known Allergies  Current Outpatient Prescriptions  Medication Sig Dispense Refill  . aspirin 325 MG tablet Take 1 tablet (325 mg total) by mouth daily.    . enalapril (VASOTEC) 5 MG tablet Take 1 tablet (5 mg total) by mouth daily.    Marland Kitchen omeprazole (PRILOSEC) 20 MG capsule Take 20 mg by mouth daily.      . penicillin v potassium (VEETID) 500 MG tablet Take 500 mg by mouth 4 (four) times daily.      . simvastatin (ZOCOR) 10 MG tablet Take 10 mg by mouth daily at 6 PM.          Review of Systems     Objective:   Physical Exam  Vitals reviewed. Constitutional: She is oriented to person, place, and time. She appears well-nourished. No distress.  HENT:  Head: Normocephalic and atraumatic.  Mouth/Throat: Oropharynx is clear and moist. No oropharyngeal exudate.  Eyes: Pupils are equal, round, and reactive to light. No scleral icterus.  Neck: Normal range of motion. Neck supple.  Cardiovascular: Normal rate, regular rhythm and normal heart sounds.   Pulmonary/Chest: Effort normal and breath sounds normal. No respiratory  distress.  Abdominal: Soft. Bowel sounds are normal. She exhibits no distension. There is no tenderness.  Musculoskeletal: She exhibits no edema.  Neurological: She is oriented to person, place, and time.       NO FOCAL DEFICITS   Psychiatric: She has a normal mood and affect.          Assessment & Plan:

## 2011-11-20 NOTE — Assessment & Plan Note (Signed)
DUE TO HEMORRHOIDS. Sx IMPROVED AFTER BANDING. BURNING SENSATION PERISTS.  PREP H CREAM OR SUPP PRN HIGH FIBER DIET CALL IN 6 WEEKS IF Sx NOT RESOLVED OPV IN 3 MOS

## 2011-11-20 NOTE — Patient Instructions (Addendum)
CONTINUE PREP H CREAMS OR SUPPOSITORIES AS NEEDED FOR RECTAL BURNING.  CONTINUE STOOL SOFTENER.  CALL IN 6 WEEKS IF BURNING SENSATION CONTINUES.  START A HIGH FIBER DIET. AVOID ITEMS THAT CAUSE BLOATING, SEE INFO BELOW.  FOLLOW UP IN 3 MOS.   High-Fiber Diet A high-fiber diet changes your normal diet to include more whole grains, legumes, fruits, and vegetables. Changes in the diet involve replacing refined carbohydrates with unrefined foods. The calorie level of the diet is essentially unchanged. The Dietary Reference Intake (recommended amount) for adult males is 38 grams per day. For adult females, it is 25 grams per day. Pregnant and lactating women should consume 28 grams of fiber per day. Fiber is the intact part of a plant that is not broken down during digestion. Functional fiber is fiber that has been isolated from the plant to provide a beneficial effect in the body. PURPOSE  Increase stool bulk.   Ease and regulate bowel movements.   Lower cholesterol.   INDICATIONS THAT YOU NEED MORE FIBER  Constipation and hemorrhoids.   Uncomplicated diverticulosis (intestine condition) and irritable bowel syndrome.   Weight management.   As a protective measure against hardening of the arteries (atherosclerosis), diabetes, and cancer.   GUIDELINES FOR INCREASING FIBER IN THE DIET  Start adding fiber to the diet slowly. A gradual increase of about 5 more grams (2 slices of whole-wheat bread, 2 servings of most fruits or vegetables, or 1 bowl of high-fiber cereal) per day is best. Too rapid an increase in fiber may result in constipation, flatulence, and bloating.   Drink enough water and fluids to keep your urine clear or pale yellow. Water, juice, or caffeine-free drinks are recommended. Not drinking enough fluid may cause constipation.   Eat a variety of high-fiber foods rather than one type of fiber.   Try to increase your intake of fiber through using high-fiber foods rather  than fiber pills or supplements that contain small amounts of fiber.   The goal is to change the types of food eaten. Do not supplement your present diet with high-fiber foods, but replace foods in your present diet.  INCLUDE A VARIETY OF FIBER SOURCES  Replace refined and processed grains with whole grains, canned fruits with fresh fruits, and incorporate other fiber sources. White rice, white breads, and most bakery goods contain little or no fiber.   [place] whole-grain rice, buckwheat oats, and many fruits and vegetables are all good sources of fiber. These include: broccoli, Brussels sprouts, cabbage, cauliflower, beets, sweet potatoes, white potatoes (skin on), carrots, tomatoes, eggplant, squash, berries, fresh fruits, and dried fruits.   Cereals appear to be the richest source of fiber. Cereal fiber is found in whole grains and bran. Bran is the fiber-rich outer coat of cereal grain, which is largely removed in refining. In whole-grain cereals, the bran remains. In breakfast cereals, the largest amount of fiber is found in those with "bran" in their names. The fiber content is sometimes indicated on the label.   You may need to include additional fruits and vegetables each day.   In baking, for 1 cup white flour, you may use the following substitutions:   1 cup whole-wheat flour minus 2 tablespoons.   1/2 cup white flour plus 1/2 cup whole-wheat flour.

## 2011-11-20 NOTE — Progress Notes (Signed)
Faxed to PCP

## 2011-11-20 NOTE — Progress Notes (Signed)
TCS/EGD AUG 2013 gastritis, simple adenoma  REVIEWED.

## 2011-12-05 ENCOUNTER — Other Ambulatory Visit: Payer: Self-pay | Admitting: Family Medicine

## 2011-12-19 ENCOUNTER — Other Ambulatory Visit: Payer: Self-pay | Admitting: Family Medicine

## 2011-12-19 MED ORDER — SIMVASTATIN 20 MG PO TABS: 20.0000 mg | ORAL_TABLET | Freq: Every day | ORAL | Status: DC

## 2011-12-31 ENCOUNTER — Ambulatory Visit (INDEPENDENT_AMBULATORY_CARE_PROVIDER_SITE_OTHER): Payer: Medicare Other | Admitting: Gastroenterology

## 2011-12-31 ENCOUNTER — Encounter: Payer: Self-pay | Admitting: Gastroenterology

## 2011-12-31 VITALS — BP 150/76 | HR 72 | Temp 98.4°F | Ht 60.0 in | Wt 146.0 lb

## 2011-12-31 DIAGNOSIS — K648 Other hemorrhoids: Secondary | ICD-10-CM

## 2011-12-31 DIAGNOSIS — R109 Unspecified abdominal pain: Secondary | ICD-10-CM | POA: Diagnosis not present

## 2011-12-31 DIAGNOSIS — R1030 Lower abdominal pain, unspecified: Secondary | ICD-10-CM

## 2011-12-31 MED ORDER — HYDROCORTISONE ACE-PRAMOXINE 1-1 % RE FOAM: 1.0000 | Freq: Two times a day (BID) | RECTAL | Status: DC

## 2011-12-31 NOTE — Progress Notes (Signed)
Referring Provider: Salley Scarlet, MD Primary Care Physician:  Milinda Antis, MD Primary Gastroenterologist: Dr. Darrick Penna   Chief Complaint  Patient presents with  . Abdominal Pain  . Rectal Pain    HPI:   Returns today in f/u, s/p EGD/TCS in Aug with banding of internal hemorrhoids. Seen Aug 2013 by Dr. Darrick Penna. States hard to clean herself doesn't feel clean. Uses a lot of toilet paper. Having lower abdominal pain, LLQ, intermittent "sharp". Nothing relieves. Lasts for a few minutes. Not associated with BMs. Happened at night as well. Rectum is sore. No rectal pain with BMs. Taking prep H every day. Underwear got messed up with blood on Saturday and noted rectal bleeding Sunday. Abdominal pain since Friday. No fever, chills. States rectum smelled like a pamper. Had a BM, took a shower, still smelled. Noted clear, snot-like discharge with blood streaks after a shower. States vagina area is burning. No urinary symptoms, urine clear.   Past Medical History  Diagnosis Date  . Hypertension   . GERD (gastroesophageal reflux disease)   . Stroke May 2013  . Hyperlipidemia     Past Surgical History  Procedure Date  . Bunionectomy   . Colonoscopy 10/31/2011     LARGE Internal hemorrhoids, S/P BANDING/ ADENOMATOUS APPEARING Polyp in the ascending colon/ multiple polyps in the  descending, sigmoid   colon and rectumSIMPLE ADENOMAS, SURVEILLANCE 2023  . Esophagogastroduodenoscopy 10/31/2011    LARGE Hiatal hernia/Mild gastritis    Current Outpatient Prescriptions  Medication Sig Dispense Refill  . aspirin 325 MG tablet Take 1 tablet (325 mg total) by mouth daily.  30 tablet  0  . enalapril (VASOTEC) 5 MG tablet Take 1 tablet (5 mg total) by mouth daily.  30 tablet  3  . omeprazole (PRILOSEC) 20 MG capsule Take 20 mg by mouth daily.      . simvastatin (ZOCOR) 10 MG tablet TAKE TWO TABLETS BY MOUTH EVERY DAY AT 6PM  60 tablet  2  . hydrocortisone-pramoxine (PROCTOFOAM HC) rectal foam  Place 1 applicator rectally 2 (two) times daily. For 7 days  10 g  0  . penicillin v potassium (VEETID) 500 MG tablet Take 500 mg by mouth 4 (four) times daily.      . simvastatin (ZOCOR) 20 MG tablet Take 1 tablet (20 mg total) by mouth daily at 6 PM.  30 tablet  3    Allergies as of 12/31/2011  . (No Known Allergies)    Family History  Problem Relation Age of Onset  . Stroke Mother   . Diabetes Mother   . Hypertension Mother   . Hyperlipidemia Mother   . Hypertension Father   . Hyperlipidemia Father   . Colon cancer Neg Hx     History   Social History  . Marital Status: Divorced    Spouse Name: N/A    Number of Children: N/A  . Years of Education: N/A   Social History Main Topics  . Smoking status: Former Smoker -- 1.0 packs/day for 50 years    Types: Cigarettes  . Smokeless tobacco: Never Used   Comment: hasn't smoked in 3 weeks   . Alcohol Use: No  . Drug Use: No  . Sexually Active: None   Other Topics Concern  . None   Social History Narrative  . None    Review of Systems: Gen: Denies fever, chills, anorexia. Denies fatigue, weakness, weight loss.  CV: Denies chest pain, palpitations, syncope, peripheral edema, and claudication. Resp: Denies dyspnea  at rest, cough, wheezing, coughing up blood, and pleurisy. GI: Denies vomiting blood, jaundice, and fecal incontinence.   Denies dysphagia or odynophagia. Derm: Denies rash, itching, dry skin Psych: Denies depression, anxiety, memory loss, confusion. No homicidal or suicidal ideation.  Heme: Denies bruising, bleeding, and enlarged lymph nodes.  Physical Exam: BP 150/76  Pulse 72  Temp 98.4 F (36.9 C) (Temporal)  Ht 5' (1.524 m)  Wt 146 lb (66.225 kg)  BMI 28.51 kg/m2 General:   Alert and oriented. No distress noted. Pleasant and cooperative.  Head:  Normocephalic and atraumatic. Eyes:  Conjuctiva clear without scleral icterus. Mouth:  Oral mucosa pink and moist. Good dentition. No lesions. Heart:  S1,  S2 present without murmurs, rubs, or gallops. Regular rate and rhythm. Abdomen:  +BS, soft, non-tender and non-distended. No rebound or guarding. No HSM or masses noted. Rectal: prolapsed internal hemorrhoid, non-thrombosed, approximately size of lima bean, easily reduced, no palpable masses on internal exam, no discomfort with exam, no gross blood.  Msk:  Symmetrical without gross deformities. Normal posture. Extremities:  Without edema. Neurologic:  Alert and  oriented x4;  grossly normal neurologically. Skin:  Intact without significant lesions or rashes. Psych:  Alert and cooperative. Normal mood and affect.

## 2011-12-31 NOTE — Patient Instructions (Addendum)
Use Proctofoam twice a day for 7 days.   Please have urine sample completed today.  I will be talking with Dr. Darrick Penna about further evaluation.

## 2012-01-01 LAB — URINALYSIS W MICROSCOPIC + REFLEX CULTURE
Casts: NONE SEEN
Crystals: NONE SEEN
Glucose, UA: NEGATIVE mg/dL
Leukocytes, UA: NEGATIVE
Nitrite: NEGATIVE
Specific Gravity, Urine: 1.015 (ref 1.005–1.030)
Squamous Epithelial / LPF: NONE SEEN
pH: 5 (ref 5.0–8.0)

## 2012-01-02 DIAGNOSIS — K648 Other hemorrhoids: Secondary | ICD-10-CM | POA: Insufficient documentation

## 2012-01-02 DIAGNOSIS — R1030 Lower abdominal pain, unspecified: Secondary | ICD-10-CM | POA: Insufficient documentation

## 2012-01-02 NOTE — Progress Notes (Signed)
PT HAD LARGE IH. SX SHE IS EXPERIENCING ARE DUE TO IH. NO LONG TERM SIDE EFFECTS FROM ENDOSCOPIC BANDING. PT MAY NEED MORE THAN ONE ENDOSCOPIC BANDING PROCEDURE. IF PT STILL HAS HEMORRHOID SX, AGREE WITH PROCTO-CREAM. REFER TO SURGERY SO SX CAN BE ADDRESSED IN ONE SESSION. AGREE WITH UA. NO NEED FOR CT.  REVIEWED.

## 2012-01-02 NOTE — Progress Notes (Signed)
Faxed to PCP

## 2012-01-02 NOTE — Assessment & Plan Note (Signed)
Recent TCS up-to-date. Unclear etiology, lasting few minutes for last several days. Notes as sharp, intermittent. No fever, chills, no evidence of diverticula on recent TCS. ?brewing UTI or other etiology. Consider CT scan, obtain UA. To discuss with Dr. Darrick Penna due to recent hx of banding.

## 2012-01-02 NOTE — Assessment & Plan Note (Signed)
Underwent TCS with banding Aug 2013. Continued rectal discomfort, intermittent rectal bleeding, foul odor per pt even after showering. Rectal exam with prolapsed non-thrombosed internal hemorrhoid easily reduced. Pt also notes lower abdominal discomfort since Friday, LLQ lasting few minutes. No associated symptoms or relation to bowel habits. No fever, chills. Pt concerned about UTI. May be presenting atypically for this, no urinary symptoms currently. Will obtain UA and culture, add proctofoam, consult with Dr. Darrick Penna regarding internal hemorrhoids, ?need for imaging.

## 2012-01-04 ENCOUNTER — Other Ambulatory Visit: Payer: Self-pay | Admitting: Family Medicine

## 2012-01-06 ENCOUNTER — Telehealth: Payer: Self-pay | Admitting: Family Medicine

## 2012-01-06 NOTE — Telephone Encounter (Signed)
Med refilled 10/5

## 2012-01-09 ENCOUNTER — Ambulatory Visit (INDEPENDENT_AMBULATORY_CARE_PROVIDER_SITE_OTHER): Payer: Medicare Other | Admitting: Family Medicine

## 2012-01-09 ENCOUNTER — Encounter: Payer: Self-pay | Admitting: Family Medicine

## 2012-01-09 VITALS — BP 126/74 | HR 70 | Resp 18 | Ht 62.0 in | Wt 146.0 lb

## 2012-01-09 DIAGNOSIS — N841 Polyp of cervix uteri: Secondary | ICD-10-CM | POA: Diagnosis not present

## 2012-01-09 DIAGNOSIS — R634 Abnormal weight loss: Secondary | ICD-10-CM | POA: Diagnosis not present

## 2012-01-09 DIAGNOSIS — F0150 Vascular dementia without behavioral disturbance: Secondary | ICD-10-CM

## 2012-01-09 DIAGNOSIS — I10 Essential (primary) hypertension: Secondary | ICD-10-CM

## 2012-01-09 MED ORDER — OMEPRAZOLE 20 MG PO CPDR: 20.0000 mg | DELAYED_RELEASE_CAPSULE | Freq: Every day | ORAL | Status: DC

## 2012-01-09 MED ORDER — ENALAPRIL MALEATE 5 MG PO TABS: 5.0000 mg | ORAL_TABLET | Freq: Every day | ORAL | Status: DC

## 2012-01-09 MED ORDER — SIMVASTATIN 20 MG PO TABS: 20.0000 mg | ORAL_TABLET | Freq: Every day | ORAL | Status: DC

## 2012-01-09 NOTE — Progress Notes (Signed)
  Subjective:    Patient ID: [name], female    DOB: May 09, 1945, 66 y.o.   MRN: [id number]  HPI  Patient here to follow chronic medical problems she has no specific concerns today. She did not see GYN regarding cervical polyp this needs to be rescheduled. She did have a colonoscopy has been having trouble with her hemorrhoids. She's status post banding however continues to have bleeding.  Review of Systems - per above   GEN- denies fatigue, fever, weight loss,weakness, recent illness HEENT- denies eye drainage, change in vision, nasal discharge, CVS- denies chest pain, palpitations RESP- denies SOB, cough, wheeze ABD- denies N/V, change in stools, abd pain GU- denies dysuria, hematuria, dribbling, incontinence MSK- denies joint pain, muscle aches, injury Neuro- denies headache, dizziness, syncope, seizure activity      Objective:   Physical Exam GEN- NAD, alert and oriented x3 HEENT- PERRL, EOMI, non injected sclera, pink conjunctiva, MMM, oropharynx clear Neck- Supple, CVS- RRR, no murmur RESP-CTAB ABD-NABS,soft, NT,ND EXT- No edema Pulses- Radial, DP- 2+ Neuro- CNII-XII in tact, normal tone, decreased strength on Left compared to right  Psych-normal affect and mood       Assessment & Plan:

## 2012-01-09 NOTE — Patient Instructions (Addendum)
Flu shot given Referral to GYN Call Dr. Darrick Penna about the hemorrhoids Continue medications Labs before next visit F/U 4 months

## 2012-01-09 NOTE — Assessment & Plan Note (Signed)
Referral to GYN again for polyp removal

## 2012-01-09 NOTE — Assessment & Plan Note (Signed)
Well controlled 

## 2012-01-09 NOTE — Assessment & Plan Note (Signed)
Doing well, no recent declines

## 2012-01-09 NOTE — Assessment & Plan Note (Signed)
Heaviest weight 149, has been maintaining around this. She has been watching her diet more, Colonoscopy, Mammogram, PAP wnl

## 2012-01-10 ENCOUNTER — Telehealth: Payer: Self-pay | Admitting: Family Medicine

## 2012-01-15 NOTE — Progress Notes (Signed)
Quick Note:  Ok, good to know urinary symptoms are not present.  I have sent a note to Soledad Gerlach, I believe, to set up referral for gen surg. Needs procedure for hemorrhoids.   Thanks! ______

## 2012-01-15 NOTE — Progress Notes (Signed)
Please refer to general surgery due to internal hemorrhoids, continued symptoms despite banding.   Thanks!

## 2012-01-15 NOTE — Progress Notes (Signed)
Quick Note:  Called and spoke to pt's daughter, Ladean Raya. She said that pt is not complaining of any urinary symptoms. But she still is having some daily bleeding from the hemorrhoids and would like to know if they can do something about that. Please advise! ______

## 2012-01-15 NOTE — Progress Notes (Signed)
Quick Note:  Culture not performed as not indicated. Appears moderate amount of blood in urine, similar to findings a few months ago. Please find out how patient is doing? Any urinary symptoms? This result somehow was overlooked, and if she is having urinary symptoms, will empirically treat for cystitis. ______

## 2012-01-20 NOTE — Progress Notes (Signed)
Patient is scheduled with Dr. Ula Lingo on Tuesday October 29 at 2:15 and she is aware

## 2012-01-20 NOTE — Progress Notes (Signed)
Quick Note:  Routing to Soledad Gerlach to schedule. ______

## 2012-01-24 ENCOUNTER — Other Ambulatory Visit: Payer: Self-pay | Admitting: Gastroenterology

## 2012-01-27 ENCOUNTER — Other Ambulatory Visit: Payer: Self-pay | Admitting: Obstetrics and Gynecology

## 2012-01-27 DIAGNOSIS — B373 Candidiasis of vulva and vagina: Secondary | ICD-10-CM | POA: Diagnosis not present

## 2012-01-27 DIAGNOSIS — N841 Polyp of cervix uteri: Secondary | ICD-10-CM | POA: Diagnosis not present

## 2012-01-27 DIAGNOSIS — N898 Other specified noninflammatory disorders of vagina: Secondary | ICD-10-CM | POA: Diagnosis not present

## 2012-01-28 ENCOUNTER — Encounter (HOSPITAL_COMMUNITY): Payer: Self-pay

## 2012-01-28 DIAGNOSIS — K648 Other hemorrhoids: Secondary | ICD-10-CM | POA: Diagnosis not present

## 2012-01-28 NOTE — H&P (Signed)
  NTS SOAP Note  Vital Signs:  Vitals as of: 01/28/2012: Systolic 176: Diastolic 84: Heart Rate 69: Temp 97.64F: Height 16ft 0in: Weight 147Lbs 0 Ounces: BMI 29  BMI : 28.71 kg/m2  Subjective: This 8 Years 3 Months old Female presents for of    HEMORRHOIDS: ,Has had intermittent blood per rectum for many years due to hemorrhoidal disease.  Was found to have internal hemorrhoids by Dr. Darrick Penna, banding attempted during TCS.  Still is having problems.  Denies constipation or straining when moving bowels.  Review of Symptoms:  Constitutional:unremarkable   Head:unremarkable    Eyes:unremarkable   Nose/Mouth/Throat:unremarkable Cardiovascular:  unremarkable   Respiratory:unremarkable   Gastrointestin    heartburn,dyspepsia Genitourinary:unremarkable     Musculoskeletal:unremarkable   Skin:unremarkable Hematolgic/Lymphatic:unremarkable     Allergic/Immunologic:unremarkable     Past Medical History:    Reviewed   Past Medical History  Surgical History: TCS with banding of hemorrhoids, EGD, bunionectomy Medical Problems: GERD, HTN, CVA hyperlipidemia Allergies: nkda Medications: asa, zocor, vasotec, prilosec, proctofoam cream   Social History:Reviewed  Social History  Preferred Language: English Race:  Black or African American Ethnicity: Not Hispanic / Latino Age: 66 Years 9 Months Marital Status:  D Alcohol:  No Recreational drug(s):  No   Smoking Status: Never smoker reviewed on 01/28/2012    Family History:  Reviewed   Family History              Father:  Hypertension             Mother:  Diabetes Type II, Coronary Artery Disease, Stroke    Objective Information: General:  Well appearing, well nourished in no distress. Neck:  Supple without lymphadenopathy.  Heart:  RRR, no murmur Lungs:    CTA bilaterally, no wheezes, rhonchi, rales.  Breathing unlabored. Abdomen:Soft, NT/ND, no HSM, no  masses.     Protruding mucosa noted along right lateral aspect of anus with hemorrhoidal tissue present.  No active bleeding.  Assessment:Internal hemorrhoidal disease  Diagnosis &amp; Procedure: DiagnosisCode: 455.2, ProcedureCode: 47829,    Plan:Scheduled for hemorrhoidectomy on 01/31/12.   Patient Education:Alternative treatments to surgery were discussed with patient (and family).  Risks and benefits  of procedure including recurrence of the hemorrhoidal disease were fully explained to the patient (and family) who gave informed consent. Patient/family questions were addressed.  Follow-up:Pending Surgery

## 2012-01-29 NOTE — Patient Instructions (Addendum)
19 [name]  01/29/2012   Your procedure is scheduled on:   01/31/2012  Report to [facility] at  0830  AM.  Call this number if you have problems the morning of surgery: [phone]   Remember:   Do not eat food:After Midnight.  May have clear liquids:until Midnight .   Take these medicines the morning of surgery with A SIP OF WATER:  vasotec,prilosec   Do not wear jewelry, make-up or nail polish.  Do not wear lotions, powders, or perfumes.   Do not shave 48 hours prior to surgery. Men may shave face and neck.  Do not bring valuables to the hospital.  Contacts, dentures or bridgework may not be worn into surgery.  Leave suitcase in the car. After surgery it may be brought to your room.  For patients admitted to the hospital, checkout time is 11:00 AM the day of discharge.   Patients discharged the day of surgery will not be allowed to drive home.  Name and phone number of your driver: family  Special Instructions: Shower using CHG 2 nights before surgery and the night before surgery.  If you shower the day of surgery use CHG.  Use special wash - you have one bottle of CHG for all showers.  You should use approximately 1/3 of the bottle for each shower.   Please read over the following fact sheets that you were given: Pain Booklet, Coughing and Deep Breathing, MRSA Information, Surgical Site Infection Prevention, Anesthesia Post-op Instructions and Care and Recovery After Surgery Hemorrhoidectomy Hemorrhoidectomy is surgery to remove hemorrhoids. Hemorrhoids are veins that have become swollen in the rectum. The rectum is the area from the bottom end of the intestines to the opening where bowel movements leave the body. Hemorrhoids can be uncomfortable. They can cause itching, bleeding and pain if a blood clot forms in them (thrombose). If hemorrhoids are small, surgery may not be needed. But if they cover a larger area, surgery is usually suggested.  LET YOUR CAREGIVER KNOW ABOUT:     Any allergies.  All medications you are taking, including:  Herbs, eyedrops, over-the-counter medications and creams.  Blood thinners (anticoagulants), aspirin or other drugs that could affect blood clotting.  Use of steroids (by mouth or as creams).  Previous problems with anesthetics, including local anesthetics.  Possibility of pregnancy, if this applies.  Any history of blood clots.  Any history of bleeding or other blood problems.  Previous surgery.  Smoking history.  Other health problems. RISKS AND COMPLICATIONS All surgery carries some risk. However, hemorrhoid surgery usually goes smoothly. Possible complications could include:  Urinary retention.  Bleeding.  Infection.  A painful incision.  A reaction to the anesthesia (this is not common). BEFORE THE PROCEDURE   Stop using aspirin and non-steroidal anti-inflammatory drugs (NSAIDs) for pain relief. This includes prescription drugs and over-the-counter drugs such as ibuprofen and naproxen. Also stop taking vitamin E. If possible, do this two weeks before your surgery.  If you take blood-thinners, ask your healthcare provider when you should stop taking them.  You will probably have blood and urine tests done several days before your surgery.  Do not eat or drink for about 8 hours before the surgery.  Arrive at least an hour before the surgery, or whenever your surgeon recommends. This will give you time to check in and fill out any needed paperwork.  Hemorrhoidectomy is often an outpatient procedure. This means you will be able to go  home the same day. Sometimes, though, people stay overnight in the hospital after the procedure. Ask your surgeon what to expect. Either way, make arrangements in advance for someone to drive you home. PROCEDURE   The preparation:  You will change into a hospital gown.  You will be given an IV. A needle will be inserted in your arm. Medication can flow directly into your  body through this needle.  You might be given an enema to clear your rectum.  Once in the operating room, you will probably lie on your side or be repositioned later to lying on your stomach.  You will be given anesthesia (medication) so you will not feel anything during the surgery. The surgery often is done with local anesthesia (the area near the hemorrhoids will be numb and you will be drowsy but awake). Sometimes, general anesthesia is used (you will be asleep during the procedure).  The procedure:  There are a few different procedures for hemorrhoids. Be sure to ask you surgeon about the procedure, the risks and benefits.  Be sure to ask about what you need to do to take care of the wound, if there is one. AFTER THE PROCEDURE  You will stay in a recovery area until the anesthesia has worn off. Your blood pressure and pulse will be checked every so often.  You may feel a lot of pain in the area of the rectum.  Take all pain medication prescribed by your surgeon. Ask before taking any over-the-counter pain medicines.  Sometimes sitting in a warm bath can help relieve your pain.  To make sure you have bowel movements without straining:  You will probably need to take stool softeners (usually a pill) for a few days.  You should drink 8 to 10 glasses of water each day.  Your activity will be restricted for awhile. Ask your caregiver for a list of what you should and should not do while you recover. Document Released: 01/13/2009 Document Revised: 06/10/2011 Document Reviewed: 01/13/2009 [facility] Patient Information 2013 [place], [place]. PATIENT INSTRUCTIONS POST-ANESTHESIA  IMMEDIATELY FOLLOWING SURGERY:  Do not drive or operate machinery for the first twenty four hours after surgery.  Do not make any important decisions for twenty four hours after surgery or while taking narcotic pain medications or sedatives.  If you develop intractable nausea and vomiting or a severe headache  please notify your doctor immediately.  FOLLOW-UP:  Please make an appointment with your surgeon as instructed. You do not need to follow up with anesthesia unless specifically instructed to do so.  WOUND CARE INSTRUCTIONS (if applicable):  Keep a dry clean dressing on the anesthesia/puncture wound site if there is drainage.  Once the wound has quit draining you may leave it open to air.  Generally you should leave the bandage intact for twenty four hours unless there is drainage.  If the epidural site drains for more than 36-48 hours please call the anesthesia department.  QUESTIONS?:  Please feel free to call your physician or the hospital operator if you have any questions, and they will be happy to assist you.

## 2012-01-30 ENCOUNTER — Encounter (HOSPITAL_COMMUNITY): Payer: Self-pay

## 2012-01-30 ENCOUNTER — Encounter (HOSPITAL_COMMUNITY)
Admission: RE | Admit: 2012-01-30 | Discharge: 2012-01-30 | Disposition: A | Discharge status: Home / Self Care | Payer: Medicare Other | Source: Ambulatory Visit | Attending: General Surgery | Admitting: General Surgery

## 2012-01-30 DIAGNOSIS — K648 Other hemorrhoids: Secondary | ICD-10-CM | POA: Diagnosis not present

## 2012-01-30 DIAGNOSIS — K644 Residual hemorrhoidal skin tags: Secondary | ICD-10-CM | POA: Diagnosis not present

## 2012-01-30 DIAGNOSIS — I10 Essential (primary) hypertension: Secondary | ICD-10-CM | POA: Diagnosis not present

## 2012-01-30 LAB — BASIC METABOLIC PANEL
BUN: 19 mg/dL (ref 6–23)
CO2: 27 mEq/L (ref 19–32)
Calcium: 9.8 mg/dL (ref 8.4–10.5)
GFR calc non Af Amer: 61 mL/min — ABNORMAL LOW (ref >90)
Glucose, Bld: 97 mg/dL (ref 70–99)

## 2012-01-30 LAB — CBC WITH DIFFERENTIAL/PLATELET
Basophils Relative: 0 % (ref 0–1)
Eosinophils Absolute: 0.1 10*3/uL (ref 0.0–0.7)
Eosinophils Relative: 2 % (ref 0–5)
Hemoglobin: 12.6 g/dL (ref 12.0–15.0)
Lymphs Abs: 2.1 10*3/uL (ref 0.7–4.0)
MCH: 29 pg (ref 26.0–34.0)
MCHC: 33.2 g/dL (ref 30.0–36.0)
MCV: 87.1 fL (ref 78.0–100.0)
Monocytes Relative: 6 % (ref 3–12)
Neutrophils Relative %: 47 % (ref 43–77)
Platelets: 283 10*3/uL (ref 150–400)
RBC: 4.35 MIL/uL (ref 3.87–5.11)

## 2012-01-30 LAB — SURGICAL PCR SCREEN: Staphylococcus aureus: NEGATIVE

## 2012-01-31 ENCOUNTER — Ambulatory Visit (HOSPITAL_COMMUNITY)
Admission: RE | Admit: 2012-01-31 | Discharge: 2012-01-31 | Disposition: A | Discharge status: Home / Self Care | Payer: Medicare Other | Source: Ambulatory Visit | Attending: General Surgery | Admitting: General Surgery

## 2012-01-31 ENCOUNTER — Encounter (HOSPITAL_COMMUNITY): Payer: Self-pay | Admitting: Anesthesiology

## 2012-01-31 ENCOUNTER — Ambulatory Visit (HOSPITAL_COMMUNITY): Payer: Medicare Other | Admitting: Anesthesiology

## 2012-01-31 ENCOUNTER — Encounter (HOSPITAL_COMMUNITY): Payer: Self-pay | Admitting: *Deleted

## 2012-01-31 ENCOUNTER — Encounter (HOSPITAL_COMMUNITY)
Admission: RE | Disposition: A | Discharge status: Home / Self Care | Payer: Self-pay | Source: Ambulatory Visit | Attending: General Surgery

## 2012-01-31 DIAGNOSIS — K644 Residual hemorrhoidal skin tags: Secondary | ICD-10-CM | POA: Diagnosis not present

## 2012-01-31 DIAGNOSIS — K649 Unspecified hemorrhoids: Secondary | ICD-10-CM | POA: Diagnosis not present

## 2012-01-31 DIAGNOSIS — K648 Other hemorrhoids: Secondary | ICD-10-CM | POA: Diagnosis not present

## 2012-01-31 DIAGNOSIS — I10 Essential (primary) hypertension: Secondary | ICD-10-CM | POA: Insufficient documentation

## 2012-01-31 HISTORY — PX: HEMORRHOID SURGERY: SHX153

## 2012-01-31 SURGERY — HEMORRHOIDECTOMY
Anesthesia: General | Site: Rectum | Wound class: Clean Contaminated

## 2012-01-31 MED ORDER — SODIUM CHLORIDE 0.9 % IV SOLN: 1.0000 g | INTRAVENOUS | Status: DC

## 2012-01-31 MED ORDER — BUPIVACAINE HCL (PF) 0.5 % IJ SOLN
INTRAMUSCULAR | Status: AC
  Filled 2012-01-31: qty 30

## 2012-01-31 MED ORDER — PROPOFOL 10 MG/ML IV EMUL
INTRAVENOUS | Status: DC | PRN
  Administered 2012-01-31: 150 mg via INTRAVENOUS

## 2012-01-31 MED ORDER — SUCCINYLCHOLINE CHLORIDE 20 MG/ML IJ SOLN
INTRAMUSCULAR | Status: DC | PRN
  Administered 2012-01-31: 140 mg via INTRAVENOUS

## 2012-01-31 MED ORDER — LIDOCAINE HCL 2 % EX GEL: CUTANEOUS | Status: DC | PRN

## 2012-01-31 MED ORDER — LIDOCAINE VISCOUS 2 % MT SOLN
OROMUCOSAL | Status: DC | PRN
  Administered 2012-01-31: 1 via OROMUCOSAL

## 2012-01-31 MED ORDER — MIDAZOLAM HCL 2 MG/2ML IJ SOLN
INTRAMUSCULAR | Status: AC
  Filled 2012-01-31: qty 2

## 2012-01-31 MED ORDER — KETOROLAC TROMETHAMINE 30 MG/ML IJ SOLN: 30.0000 mg | Freq: Once | INTRAMUSCULAR | Status: DC

## 2012-01-31 MED ORDER — FENTANYL CITRATE 0.05 MG/ML IJ SOLN
INTRAMUSCULAR | Status: AC
  Filled 2012-01-31: qty 2

## 2012-01-31 MED ORDER — ONDANSETRON HCL 4 MG/2ML IJ SOLN: 4.0000 mg | Freq: Once | INTRAMUSCULAR | Status: DC | PRN

## 2012-01-31 MED ORDER — LACTATED RINGERS IV SOLN
INTRAVENOUS | Status: DC | PRN
  Administered 2012-01-31: 09:00:00 via INTRAVENOUS

## 2012-01-31 MED ORDER — LIDOCAINE VISCOUS 2 % MT SOLN
OROMUCOSAL | Status: AC
  Filled 2012-01-31: qty 15

## 2012-01-31 MED ORDER — LACTATED RINGERS IV SOLN
INTRAVENOUS | Status: DC
  Administered 2012-01-31: 09:00:00 via INTRAVENOUS

## 2012-01-31 MED ORDER — SODIUM CHLORIDE 0.9 % IR SOLN
Status: DC | PRN
  Administered 2012-01-31: 1000 mL

## 2012-01-31 MED ORDER — SODIUM CHLORIDE 0.9 % IV SOLN
INTRAVENOUS | Status: AC
  Filled 2012-01-31: qty 1

## 2012-01-31 MED ORDER — FENTANYL CITRATE 0.05 MG/ML IJ SOLN: 25.0000 ug | INTRAMUSCULAR | Status: DC | PRN

## 2012-01-31 MED ORDER — SUCCINYLCHOLINE CHLORIDE 20 MG/ML IJ SOLN
INTRAMUSCULAR | Status: AC
  Filled 2012-01-31: qty 1

## 2012-01-31 MED ORDER — SODIUM CHLORIDE 0.9 % IV SOLN
1.0000 g | INTRAVENOUS | Status: DC | PRN
  Administered 2012-01-31: 1 g via INTRAVENOUS

## 2012-01-31 MED ORDER — ARTIFICIAL TEARS OP OINT
TOPICAL_OINTMENT | OPHTHALMIC | Status: AC
  Filled 2012-01-31: qty 3.5

## 2012-01-31 MED ORDER — BUPIVACAINE HCL (PF) 0.5 % IJ SOLN
INTRAMUSCULAR | Status: DC | PRN
  Administered 2012-01-31: 4.5 mL

## 2012-01-31 MED ORDER — PROPOFOL 10 MG/ML IV EMUL
INTRAVENOUS | Status: AC
  Filled 2012-01-31: qty 20

## 2012-01-31 MED ORDER — MIDAZOLAM HCL 2 MG/2ML IJ SOLN
1.0000 mg | INTRAMUSCULAR | Status: DC | PRN
  Administered 2012-01-31: 2 mg via INTRAVENOUS

## 2012-01-31 MED ORDER — HYDROCODONE-ACETAMINOPHEN 5-325 MG PO TABS: 1.0000 | ORAL_TABLET | ORAL | Status: DC | PRN

## 2012-01-31 MED ORDER — ENOXAPARIN SODIUM 40 MG/0.4ML ~~LOC~~ SOLN
SUBCUTANEOUS | Status: AC
  Filled 2012-01-31: qty 0.4

## 2012-01-31 MED ORDER — ENOXAPARIN SODIUM 40 MG/0.4ML ~~LOC~~ SOLN
40.0000 mg | Freq: Once | SUBCUTANEOUS | Status: AC
  Administered 2012-01-31: 40 mg via SUBCUTANEOUS

## 2012-01-31 MED ORDER — FENTANYL CITRATE 0.05 MG/ML IJ SOLN
INTRAMUSCULAR | Status: DC | PRN
  Administered 2012-01-31 (×2): 50 ug via INTRAVENOUS

## 2012-01-31 MED ORDER — LIDOCAINE HCL (CARDIAC) 10 MG/ML IV SOLN
INTRAVENOUS | Status: DC | PRN
  Administered 2012-01-31: 50 mg via INTRAVENOUS

## 2012-01-31 MED ORDER — LIDOCAINE HCL (PF) 1 % IJ SOLN
INTRAMUSCULAR | Status: AC
  Filled 2012-01-31: qty 5

## 2012-01-31 SURGICAL SUPPLY — 28 items
BAG HAMPER (MISCELLANEOUS) ×2 IMPLANT
CLOTH BEACON ORANGE TIMEOUT ST (SAFETY) ×2 IMPLANT
COVER LIGHT HANDLE STERIS (MISCELLANEOUS) ×4 IMPLANT
DECANTER SPIKE VIAL GLASS SM (MISCELLANEOUS) ×2 IMPLANT
DRAPE PROXIMA HALF (DRAPES) ×2 IMPLANT
ELECT REM PT RETURN 9FT ADLT (ELECTROSURGICAL) ×2
ELECTRODE REM PT RTRN 9FT ADLT (ELECTROSURGICAL) ×1 IMPLANT
FORMALIN 10 PREFIL 120ML (MISCELLANEOUS) ×2 IMPLANT
GLOVE BIO SURGEON STRL SZ7.5 (GLOVE) ×2 IMPLANT
GLOVE BIOGEL PI IND STRL 7.0 (GLOVE) ×1 IMPLANT
GLOVE BIOGEL PI INDICATOR 7.0 (GLOVE) ×1
GLOVE SS BIOGEL STRL SZ 6.5 (GLOVE) ×1 IMPLANT
GLOVE SUPERSENSE BIOGEL SZ 6.5 (GLOVE) ×1
GOWN STRL REIN XL XLG (GOWN DISPOSABLE) ×4 IMPLANT
HEMOSTAT SURGICEL 4X8 (HEMOSTASIS) ×2 IMPLANT
KIT ROOM TURNOVER AP CYSTO (KITS) ×2 IMPLANT
LIGASURE IMPACT 36 18CM CVD LR (INSTRUMENTS) ×2 IMPLANT
MANIFOLD NEPTUNE II (INSTRUMENTS) ×2 IMPLANT
NEEDLE HYPO 25X1 1.5 SAFETY (NEEDLE) ×2 IMPLANT
NS IRRIG 1000ML POUR BTL (IV SOLUTION) ×2 IMPLANT
PACK PERI GYN (CUSTOM PROCEDURE TRAY) ×2 IMPLANT
PAD ARMBOARD 7.5X6 YLW CONV (MISCELLANEOUS) ×2 IMPLANT
SET BASIN LINEN APH (SET/KITS/TRAYS/PACK) ×2 IMPLANT
SHEARS HARMONIC 9CM CVD (BLADE) IMPLANT
SPONGE GAUZE 4X4 12PLY (GAUZE/BANDAGES/DRESSINGS) ×2 IMPLANT
SUT SILK 0 FSL (SUTURE) ×2 IMPLANT
SUT VIC AB 2-0 CT2 27 (SUTURE) IMPLANT
SYR CONTROL 10ML LL (SYRINGE) ×2 IMPLANT

## 2012-01-31 NOTE — Anesthesia Preprocedure Evaluation (Signed)
Anesthesia Evaluation  Patient identified by MRN, date of birth, ID band Patient awake    Reviewed: Allergy & Precautions, H&P , Patient's Chart, lab work & pertinent test results  Airway Mallampati: I TM Distance: >3 FB     Dental  (+) Implants and Teeth Intact   Pulmonary  breath sounds clear to auscultation        Cardiovascular hypertension, Pt. on medications Rhythm:Regular Rate:Normal     Neuro/Psych CVA, No Residual Symptoms    GI/Hepatic GERD-  Medicated and Controlled,  Endo/Other    Renal/GU      Musculoskeletal   Abdominal   Peds  Hematology   Anesthesia Other Findings   Reproductive/Obstetrics                           Anesthesia Physical Anesthesia Plan  ASA: III  Anesthesia Plan: General   Post-op Pain Management:    Induction: Intravenous, Rapid sequence and Cricoid pressure planned  Airway Management Planned: Oral ETT  Additional Equipment:   Intra-op Plan:   Post-operative Plan: Extubation in OR  Informed Consent: I have reviewed the patients History and Physical, chart, labs and discussed the procedure including the risks, benefits and alternatives for the proposed anesthesia with the patient or authorized representative who has indicated his/her understanding and acceptance.     Plan Discussed with:   Anesthesia Plan Comments:         Anesthesia Quick Evaluation

## 2012-01-31 NOTE — Transfer of Care (Signed)
  Anesthesia Post-op Note  Patient: [name]  Procedure(s) Performed: Procedure(s) (LRB) with comments: HEMORRHOIDECTOMY (N/A)  Patient Location: PACU  Anesthesia Type: General  Level of Consciousness: awake, alert , oriented and patient cooperative  Airway and Oxygen Therapy: Patient Spontanous Breathing and Patient connected to face mask oxygen  Post-op Pain: mild  Post-op Assessment: Post-op Vital signs reviewed, Patient's Cardiovascular Status Stable, Respiratory Function Stable, Patent Airway and No signs of Nausea or vomiting  Post-op Vital Signs: Reviewed and stable  Complications: No apparent anesthesia complications

## 2012-01-31 NOTE — Op Note (Signed)
Patient:  [name]  DOB:  05/16/1945  MRN:  [id number]   Preop Diagnosis:  Hemorrhoidal disease  Postop Diagnosis:  Same  Procedure:  Hemorrhoidectomy  Surgeon:  Franky Macho, M.D.  Anes:   general  Indications:  Patient is a 66 year old white female presents with protruding internal and external hemorrhoids at the 12:00 position. The risks and benefits of the procedure including bleeding, infection, and recurrence of hemorrhoidal disease were fully explained to the patient, gave informed consent.  Procedure note:  The patient was placed in the lithotomy position after induction of general endotracheal anesthesia. The perineum was prepped and draped using usual sterile technique with a benign. Surgical site confirmation was performed.  On rectal examination, the patient had a significant protrusion of mucosa and hemorrhoidal tissue at the 12:00 position. She also had external hemorrhoidal skin tag. No other significant hemorrhoidal disease was noted. Both the internal and external hemorrhoids were excised using the LigaSure. He was sent to pathology further examination. No bleeding was noted at the end of the procedure. Of note was the fact that no rectocele was present. 0.5% Sensorcaine was instilled the surrounding peritoneum. Surgicel and Viscous Xylocaine rectal packing was placed.  All tape and needle counts were correct at the end of the procedure. Patient was awakened and transferred to PACU in stable condition.  Complications:  None  EBL:  Minimal  Specimen:  Hemorrhoids and rectal mucosa

## 2012-01-31 NOTE — Preoperative (Signed)
Beta Blockers   Reason not to administer Beta Blockers:Not Applicable 

## 2012-01-31 NOTE — Interval H&P Note (Signed)
History and Physical Interval Note:  01/31/2012 9:21 AM  [name]  has presented today for surgery, with the diagnosis of Hemorrhoids  The various methods of treatment have been discussed with the patient and family. After consideration of risks, benefits and other options for treatment, the patient has consented to  Procedure(s) (LRB) with comments: HEMORRHOIDECTOMY (N/A) as a surgical intervention .  The patient's history has been reviewed, patient examined, no change in status, stable for surgery.  I have reviewed the patient's chart and labs.  Questions were answered to the patient's satisfaction.     Franky Macho A

## 2012-01-31 NOTE — Anesthesia Postprocedure Evaluation (Signed)
  Anesthesia Post-op Note  Patient: [name]  Procedure(s) Performed: Procedure(s) (LRB) with comments: HEMORRHOIDECTOMY (N/A)  Patient Location: PACU  Anesthesia Type: General  Level of Consciousness: awake, alert , oriented and patient cooperative  Airway and Oxygen Therapy: Patient Spontanous Breathing and Patient connected to face mask oxygen  Post-op Pain: mild  Post-op Assessment: Post-op Vital signs reviewed, Patient's Cardiovascular Status Stable, Respiratory Function Stable, Patent Airway and No signs of Nausea or vomiting  Post-op Vital Signs: Reviewed and stable  Complications: No apparent anesthesia complications  

## 2012-01-31 NOTE — Progress Notes (Signed)
Awake. Denies pain. Rectal packing d/c'd. Tolerated well.

## 2012-02-03 ENCOUNTER — Emergency Department (HOSPITAL_COMMUNITY): Payer: Medicare Other

## 2012-02-03 ENCOUNTER — Encounter (HOSPITAL_COMMUNITY): Payer: Self-pay | Admitting: *Deleted

## 2012-02-03 ENCOUNTER — Observation Stay (HOSPITAL_COMMUNITY)
Admit: 2012-02-03 | Discharge: 2012-02-03 | Disposition: A | Discharge status: Home / Self Care | Payer: Medicare Other | Attending: Internal Medicine | Admitting: Internal Medicine

## 2012-02-03 ENCOUNTER — Observation Stay (HOSPITAL_COMMUNITY)
Admission: EM | Admit: 2012-02-03 | Discharge: 2012-02-05 | Disposition: A | Discharge status: Home / Self Care | Payer: Medicare Other | Attending: Internal Medicine | Admitting: Internal Medicine

## 2012-02-03 DIAGNOSIS — R5383 Other fatigue: Secondary | ICD-10-CM | POA: Diagnosis not present

## 2012-02-03 DIAGNOSIS — K921 Melena: Secondary | ICD-10-CM

## 2012-02-03 DIAGNOSIS — F0150 Vascular dementia without behavioral disturbance: Secondary | ICD-10-CM

## 2012-02-03 DIAGNOSIS — R569 Unspecified convulsions: Secondary | ICD-10-CM | POA: Diagnosis present

## 2012-02-03 DIAGNOSIS — Z23 Encounter for immunization: Secondary | ICD-10-CM | POA: Diagnosis not present

## 2012-02-03 DIAGNOSIS — K219 Gastro-esophageal reflux disease without esophagitis: Secondary | ICD-10-CM | POA: Diagnosis not present

## 2012-02-03 DIAGNOSIS — E872 Acidosis: Secondary | ICD-10-CM

## 2012-02-03 DIAGNOSIS — M6281 Muscle weakness (generalized): Secondary | ICD-10-CM

## 2012-02-03 DIAGNOSIS — I6350 Cerebral infarction due to unspecified occlusion or stenosis of unspecified cerebral artery: Secondary | ICD-10-CM | POA: Diagnosis not present

## 2012-02-03 DIAGNOSIS — G40401 Other generalized epilepsy and epileptic syndromes, not intractable, with status epilepticus: Secondary | ICD-10-CM | POA: Diagnosis not present

## 2012-02-03 DIAGNOSIS — I10 Essential (primary) hypertension: Secondary | ICD-10-CM | POA: Diagnosis present

## 2012-02-03 DIAGNOSIS — I69998 Other sequelae following unspecified cerebrovascular disease: Principal | ICD-10-CM | POA: Insufficient documentation

## 2012-02-03 DIAGNOSIS — R634 Abnormal weight loss: Secondary | ICD-10-CM

## 2012-02-03 DIAGNOSIS — Z8673 Personal history of transient ischemic attack (TIA), and cerebral infarction without residual deficits: Secondary | ICD-10-CM

## 2012-02-03 DIAGNOSIS — N760 Acute vaginitis: Secondary | ICD-10-CM

## 2012-02-03 DIAGNOSIS — G4730 Sleep apnea, unspecified: Secondary | ICD-10-CM | POA: Diagnosis not present

## 2012-02-03 DIAGNOSIS — E8729 Other acidosis: Secondary | ICD-10-CM | POA: Diagnosis present

## 2012-02-03 DIAGNOSIS — N841 Polyp of cervix uteri: Secondary | ICD-10-CM

## 2012-02-03 DIAGNOSIS — R1030 Lower abdominal pain, unspecified: Secondary | ICD-10-CM

## 2012-02-03 DIAGNOSIS — I639 Cerebral infarction, unspecified: Secondary | ICD-10-CM

## 2012-02-03 DIAGNOSIS — R531 Weakness: Secondary | ICD-10-CM | POA: Diagnosis present

## 2012-02-03 DIAGNOSIS — K648 Other hemorrhoids: Secondary | ICD-10-CM

## 2012-02-03 DIAGNOSIS — Z720 Tobacco use: Secondary | ICD-10-CM

## 2012-02-03 LAB — CBC WITH DIFFERENTIAL/PLATELET
Basophils Relative: 0 % (ref 0–1)
Eosinophils Absolute: 0.1 10*3/uL (ref 0.0–0.7)
HCT: 39.8 % (ref 36.0–46.0)
Hemoglobin: 13.1 g/dL (ref 12.0–15.0)
Lymphs Abs: 3.5 10*3/uL (ref 0.7–4.0)
MCH: 29 pg (ref 26.0–34.0)
MCHC: 32.9 g/dL (ref 30.0–36.0)
Monocytes Absolute: 0.4 10*3/uL (ref 0.1–1.0)
Monocytes Relative: 7 % (ref 3–12)
RBC: 4.52 MIL/uL (ref 3.87–5.11)

## 2012-02-03 LAB — GLUCOSE, CAPILLARY: Glucose-Capillary: 146 mg/dL — ABNORMAL HIGH (ref 70–99)

## 2012-02-03 LAB — COMPREHENSIVE METABOLIC PANEL
Albumin: 4.3 g/dL (ref 3.5–5.2)
Alkaline Phosphatase: 90 U/L (ref 39–117)
BUN: 17 mg/dL (ref 6–23)
Chloride: 101 mEq/L (ref 96–112)
Creatinine, Ser: 0.92 mg/dL (ref 0.50–1.10)
GFR calc Af Amer: 73 mL/min — ABNORMAL LOW (ref >90)
Glucose, Bld: 158 mg/dL — ABNORMAL HIGH (ref 70–99)
Total Bilirubin: 0.3 mg/dL (ref 0.3–1.2)
Total Protein: 7.8 g/dL (ref 6.0–8.3)

## 2012-02-03 LAB — TROPONIN I: Troponin I: <0.3 ng/mL (ref <0.30)

## 2012-02-03 MED ORDER — ENALAPRIL MALEATE 5 MG PO TABS
5.0000 mg | ORAL_TABLET | Freq: Every day | ORAL | Status: DC
  Administered 2012-02-03 – 2012-02-04 (×2): 5 mg via ORAL
  Filled 2012-02-03 (×2): qty 1

## 2012-02-03 MED ORDER — MAGNESIUM HYDROXIDE 400 MG/5ML PO SUSP: 30.0000 mL | Freq: Every day | ORAL | Status: DC | PRN

## 2012-02-03 MED ORDER — SODIUM CHLORIDE 0.9 % IJ SOLN
3.0000 mL | Freq: Two times a day (BID) | INTRAMUSCULAR | Status: DC
  Administered 2012-02-03 – 2012-02-04 (×3): 3 mL via INTRAVENOUS
  Filled 2012-02-03 (×2): qty 3

## 2012-02-03 MED ORDER — HYDROCORTISONE ACE-PRAMOXINE 1-1 % RE FOAM
Freq: Two times a day (BID) | RECTAL | Status: DC
Start: 2012-02-03 — End: 2012-02-05
  Administered 2012-02-03 – 2012-02-04 (×3): via RECTAL
  Filled 2012-02-03: qty 10

## 2012-02-03 MED ORDER — ONDANSETRON HCL 4 MG PO TABS: 4.0000 mg | ORAL_TABLET | Freq: Four times a day (QID) | ORAL | Status: DC | PRN

## 2012-02-03 MED ORDER — ONDANSETRON HCL 4 MG/2ML IJ SOLN: 4.0000 mg | Freq: Four times a day (QID) | INTRAMUSCULAR | Status: DC | PRN

## 2012-02-03 MED ORDER — SODIUM CHLORIDE 0.9 % IV SOLN
INTRAVENOUS | Status: AC
  Administered 2012-02-03: 15:00:00 via INTRAVENOUS

## 2012-02-03 MED ORDER — PANTOPRAZOLE SODIUM 40 MG PO TBEC
40.0000 mg | DELAYED_RELEASE_TABLET | Freq: Every day | ORAL | Status: DC
  Administered 2012-02-03 – 2012-02-04 (×2): 40 mg via ORAL
  Filled 2012-02-03 (×2): qty 1

## 2012-02-03 MED ORDER — GADOBENATE DIMEGLUMINE 529 MG/ML IV SOLN
13.0000 mL | Freq: Once | INTRAVENOUS | Status: AC | PRN
  Administered 2012-02-03: 13 mL via INTRAVENOUS

## 2012-02-03 MED ORDER — HYDROCODONE-ACETAMINOPHEN 5-325 MG PO TABS
1.0000 | ORAL_TABLET | ORAL | Status: DC | PRN
  Administered 2012-02-04: 2 via ORAL
  Filled 2012-02-03: qty 2

## 2012-02-03 MED ORDER — LEVETIRACETAM 500 MG PO TABS
500.0000 mg | ORAL_TABLET | Freq: Two times a day (BID) | ORAL | Status: DC
  Administered 2012-02-03 – 2012-02-04 (×4): 500 mg via ORAL
  Filled 2012-02-03 (×4): qty 1

## 2012-02-03 MED ORDER — ASPIRIN 325 MG PO TABS
325.0000 mg | ORAL_TABLET | Freq: Every day | ORAL | Status: DC
  Administered 2012-02-03 – 2012-02-04 (×2): 325 mg via ORAL
  Filled 2012-02-03 (×2): qty 1

## 2012-02-03 MED ORDER — ACETAMINOPHEN 325 MG PO TABS: 650.0000 mg | ORAL_TABLET | Freq: Four times a day (QID) | ORAL | Status: DC | PRN

## 2012-02-03 MED ORDER — PNEUMOCOCCAL VAC POLYVALENT 25 MCG/0.5ML IJ INJ
0.5000 mL | INJECTION | INTRAMUSCULAR | Status: AC
  Administered 2012-02-04: 0.5 mL via INTRAMUSCULAR
  Filled 2012-02-03: qty 0.5

## 2012-02-03 MED ORDER — SIMVASTATIN 20 MG PO TABS
20.0000 mg | ORAL_TABLET | Freq: Every day | ORAL | Status: DC
  Administered 2012-02-03 – 2012-02-04 (×2): 20 mg via ORAL
  Filled 2012-02-03 (×2): qty 1

## 2012-02-03 NOTE — ED Provider Notes (Signed)
History   This chart was scribed for Benny Lennert, MD by Charolett Bumpers . The patient was seen in room APA02/APA02. Patient's care was started at 0802.   CSN: [id number]  Arrival date & time      First MD Initiated Contact with Patient 02/03/12 0802      Chief Complaint  Patient presents with  . Seizures   Level 5 Caveat: Seizure, hx is limited due to the condition of the pt.   HPI Comments: [name] is a 66 y.o. female who presents to the Emergency Department by ambulance complaining of a grand mal seizure that occurred PTA. EMS states the pt woke up this morning around 7:25 am and was unable to move left side. EMS was transporting pt to [facility] when she started having a grand mal seizure starting in the left leg then generalizing. EMS administered Ativan en route. They state the pt was alert, awake and oriented. They also state her BP was elevated on scene. BP here in ED is 195/83. EMS reports a h/o CVA, but no h/o seizures. The pt had a hemorrhoidectomy on 11/1.    Patient is a 66 y.o. female presenting with seizures. The history is provided by the EMS personnel. The history is limited by the condition of the patient. No language interpreter was used.  Seizures  This is a new problem. The current episode started less than 1 hour ago. The problem has not changed since onset.There was 1 seizure. The episode was witnessed. The seizure(s) had left-sided (then generalized) focality. Meds prior to arrival: Ativan.    Past Medical History  Diagnosis Date  . Hypertension   . GERD (gastroesophageal reflux disease)   . Stroke May 2013  . Hyperlipidemia     Past Surgical History  Procedure Date  . Bunionectomy   . Colonoscopy 10/31/2011     LARGE Internal hemorrhoids, S/P BANDING/ ADENOMATOUS APPEARING Polyp in the ascending colon/ multiple polyps in the  descending, sigmoid   colon and rectumSIMPLE ADENOMAS, SURVEILLANCE 2023  . Esophagogastroduodenoscopy 10/31/2011    LARGE Hiatal hernia/Mild gastritis    Family History  Problem Relation Age of Onset  . Stroke Mother   . Diabetes Mother   . Hypertension Mother   . Hyperlipidemia Mother   . Hypertension Father   . Hyperlipidemia Father   . Colon cancer Neg Hx     History  Substance Use Topics  . Smoking status: Former Smoker -- 1.0 packs/day for 50 years    Types: Cigarettes  . Smokeless tobacco: Never Used     Comment: hasn't smoked in 3 weeks   . Alcohol Use: No    OB History    Grav Para Term Preterm Abortions TAB SAB Ect Mult Living                  Review of Systems  Unable to perform ROS: Other  Due to the condition of the patient: seizure  Allergies  Review of patient's allergies indicates no known allergies.  Home Medications   Current Outpatient Rx  Name  Route  Sig  Dispense  Refill  . ACETAMINOPHEN 500 MG PO TABS   Oral   Take 1,000 mg by mouth every 6 (six) hours as needed. Pain.         . ASPIRIN 325 MG PO TABS   Oral   Take 1 tablet (325 mg total) by mouth daily.   30 tablet   0   .  ENALAPRIL MALEATE 5 MG PO TABS   Oral   Take 1 tablet (5 mg total) by mouth daily.   90 tablet   1   . HYDROCODONE-ACETAMINOPHEN 5-325 MG PO TABS   Oral   Take 1-2 tablets by mouth every 4 (four) hours as needed for pain.   50 tablet   0   . LANSOPRAZOLE 30 MG PO CPDR   Oral   Take 30 mg by mouth daily.         Marland Kitchen PROCTOFOAM HC 1-1 % RE FOAM      USE ONE APPLICATORFUL RECTALLY AS DIRECTED TWICE DAILY FOR 7 DAYS   10 g   0   . SIMVASTATIN 20 MG PO TABS   Oral   Take 1 tablet (20 mg total) by mouth daily at 6 PM.   90 tablet   1   . OMEPRAZOLE 20 MG PO CPDR   Oral   Take 1 capsule (20 mg total) by mouth daily.   90 capsule   1     BP 189/73  Pulse 88  Resp 14  SpO2 100%  Physical Exam  Nursing note and vitals reviewed. Constitutional: She is oriented to person, place, and time. She appears well-developed.  HENT:  Head: Normocephalic and  atraumatic.  Eyes: Conjunctivae normal and EOM are normal. No scleral icterus.  Neck: Normal range of motion. Neck supple. No thyromegaly present.  Cardiovascular: Normal rate, regular rhythm and normal heart sounds.  Exam reveals no gallop and no friction rub.   No murmur heard. Pulmonary/Chest: Effort normal and breath sounds normal. No stridor. She has no wheezes. She has no rales. She exhibits no tenderness.  Abdominal: Soft. She exhibits no distension. There is no tenderness. There is no rebound.  Musculoskeletal: Normal range of motion. She exhibits no edema.  Lymphadenopathy:    She has no cervical adenopathy.  Neurological: She is alert and oriented to person, place, and time. No cranial nerve deficit. Coordination normal.       Upon initial exam, pt was awake but confused. Unable to follow commands. Now, pt is awake, oriented x4. Able to follow commands. Mild weakness in LLE and LUE.   Skin: No rash noted. No erythema.  Psychiatric: She has a normal mood and affect. Her behavior is normal.    ED Course  Procedures (including critical care time)  DIAGNOSTIC STUDIES: Oxygen Saturation is 100% on room air, normal by my interpretation.    COORDINATION OF CARE:  08:05-Initial screening of pt was preformed and obtained hx from EMS. Pt being transported to CT.   08:35-Performed physical exam. Spoke with the pt's daughter here in ED. She states the pt's was normal last night but her BP has been elevated for the past week. She reports a h/o CVA in May/2013 with residue left sided arm weakness. She states she normally ambulates without assistance. Discussed ordering an MRI with daughter. PCP: Dr. Jeanice Lim  11:41-Recheck: Informed pt and daughter of imaging and lab results. Discussed the need for admission. They are agreeable at this time.   11:52-Consultation with admitting hospitalist. Discussed pt's case. Pt to be admitted.    Labs Reviewed  CBC WITH DIFFERENTIAL - Abnormal; Notable  for the following:    Neutrophils Relative 29 (*)     Neutro Abs 1.6 (*)     Lymphocytes Relative 62 (*)     All other components within normal limits  COMPREHENSIVE METABOLIC PANEL - Abnormal; Notable for the following:  CO2 18 (*)     Glucose, Bld 158 (*)     GFR calc non Af Amer 63 (*)     GFR calc Af Amer 73 (*)     All other components within normal limits  TROPONIN I  URINALYSIS, ROUTINE W REFLEX MICROSCOPIC   Ct Head Wo Contrast  02/03/2012  *RADIOLOGY REPORT*  Clinical Data: Seizure  CT HEAD WITHOUT CONTRAST  Technique:  Contiguous axial images were obtained from the base of the skull through the vertex without contrast.  Comparison: 08/06/2011  Findings: Minimal atrophy. Stable ventricular morphology. No midline shift or mass effect. Old infarcts right parietal lobe, left thalamus and inferior right cerebellum. No intracranial hemorrhage, mass lesion evidence of acute infarction. No extra-axial fluid collections. Streak artifacts at skull base. Chronic opacification of an expanded right ethmoid air cell question mucocele. Tiny mucosal retention cyst left sphenoid sinus. Bones unremarkable.  IMPRESSION: Old infarcts right parietal lobe, left thalamus and inferior right cerebellum. No acute intracranial abnormalities.  Findings called to Dr. Estell Harpin on 02/03/2012 at 0820 hours.   Original Report Authenticated By: Ulyses Southward, M.D.    Mr Laqueta Jean Wo Contrast  02/03/2012  *RADIOLOGY REPORT*  Clinical Data: Seizure  MRI HEAD WITHOUT AND WITH CONTRAST  Technique:  Multiplanar, multiecho pulse sequences of the brain and surrounding structures were obtained according to standard protocol without and with intravenous contrast  Contrast: 13mL MULTIHANCE GADOBENATE DIMEGLUMINE 529 MG/ML IV SOLN  Comparison: Head CT same day.  MRI 08/06/2011.  Findings: The brainstem is unremarkable.  There are old small vessel infarctions within the right cerebellum.  There is an old infarction in the right middle  cerebral artery territory affecting the parietal region, which was acute in May of this year.  This has progressed atrophy, encephalomalacia and gliosis.  There is restricted diffusion along the deep margins of this old infarction. I think this probably relates to T2 shine through rather than acute extension.  Elsewhere, the brain shows old small vessel infarctions within the deep white matter. There are old thalamic lacunar infarctions.  No evidence of mass lesion, hemorrhage, hydrocephalus or extra-axial collection.  No mesial temporal lesion. No pituitary mass.  No inflammatory sinus disease.  No skull or skull base lesion.  Images described as postcontrast show a paucity of evidence of Gadolinium.  IMPRESSION: No identifiably acute finding.  Old right parietal cortical and subcortical infarction which has progressed to atrophy, encephalomalacia and gliosis.  Chronic small vessel change of the hemispheric white matter in the right cerebellum.   Original Report Authenticated By: Paulina Fusi, M.D.      No diagnosis found.  CRITICAL CARE Performed by: Louellen Haldeman L   Total critical care time: 35  Critical care time was exclusive of separately billable procedures and treating other patients.  Critical care was necessary to treat or prevent imminent or life-threatening deterioration.  Critical care was time spent personally by me on the following activities: development of treatment plan with patient and/or surrogate as well as nursing, discussions with consultants, evaluation of patient's response to treatment, examination of patient, obtaining history from patient or surrogate, ordering and performing treatments and interventions, ordering and review of laboratory studies, ordering and review of radiographic studies, pulse oximetry and re-evaluation of patient's condition.   MDM     The chart was scribed for me under my direct supervision.  I personally performed the history, physical, and  medical decision making and all procedures in the evaluation of this patient.Marland Kitchen  Benny Lennert, MD 02/03/12 1159

## 2012-02-03 NOTE — ED Notes (Signed)
Per EMS - pt woke up around 0725 this am with weakness/numbness to left arm.  Reports last normal last night around 0915pm.  EMS en route to [facility] when pt began to have seizure like activity.  EMS gave ativan 2mg  IV en route, cbg en route 109.  Pt alert and oriented x 4 at this time, reports weakness is getting better and denies numbness at this time.  nad noted.

## 2012-02-03 NOTE — Progress Notes (Signed)
Late Entry:  1915 SCD's applied bilaterally to the patient's lower extremities. -patient tolerated well  2035 Patient's daughter at the bedside. -provided blanket and pillow at her request

## 2012-02-03 NOTE — H&P (Signed)
Hospital Admission Note Date: 02/03/2012  Patient name: [name] Medical record number: [id number] Date of birth: 12/22/1945 Age: 66 y.o. Gender: female PCP: Milinda Antis, MD  Attending physician: Christiane Ha, MD  Chief Complaint: Left arm weakness.  History of Present Illness:  [name] is an 66 y.o. female with a history of previous right MCA stroke who came to the emergency room via EMS with left hemiparesis. She reportedly woke up with symptoms. Her daughter reports also that her left leg seemed to be shaking and she could not bear weight. Her face "was wrenched up". On route, patient had a generalized seizure, received IV Ativan. In the emergency room, she was noted to have an elevated blood pressure of 189/73. She was initially confused and unable to follow commands. She had mild weakness of the left arm and leg. Her confusion cleared and she became oriented and was able to follow commands. She reported no tongue laceration or lip laceration. No previous history of seizure. No incontinence. CT of the brain and MRI of the brain showed no acute stroke or bleed. She did not take her medications today, but has been compliant with her medications. Denies history of alcohol or drug use.  Past Medical History  Diagnosis Date  . Hypertension   . GERD (gastroesophageal reflux disease)   . Stroke May 2013  . Hyperlipidemia     Meds: Prescriptions prior to admission  Medication Sig Dispense Refill  . acetaminophen (TYLENOL) 500 MG tablet Take 1,000 mg by mouth every 6 (six) hours as needed. Pain.      Marland Kitchen aspirin 325 MG tablet Take 1 tablet (325 mg total) by mouth daily.  30 tablet  0  . enalapril (VASOTEC) 5 MG tablet Take 1 tablet (5 mg total) by mouth daily.  90 tablet  1  . HYDROcodone-acetaminophen (NORCO) 5-325 MG per tablet Take 1-2 tablets by mouth every 4 (four) hours as needed for pain.  50 tablet  0  . lansoprazole (PREVACID) 30 MG capsule Take 30 mg by mouth daily.       Marland Kitchen PROCTOFOAM HC rectal foam USE ONE APPLICATORFUL RECTALLY AS DIRECTED TWICE DAILY FOR 7 DAYS  10 g  0  . simvastatin (ZOCOR) 20 MG tablet Take 1 tablet (20 mg total) by mouth daily at 6 PM.  90 tablet  1  . omeprazole (PRILOSEC) 20 MG capsule Take 1 capsule (20 mg total) by mouth daily.  90 capsule  1    Allergies: Review of patient's allergies indicates no known allergies. History   Social History  . Marital Status: Divorced    Spouse Name: N/A    Number of Children: N/A  . Years of Education: N/A   Occupational History  . Not on file.   Social History Main Topics  . Smoking status: Former Smoker -- 1.0 packs/day for 50 years    Types: Cigarettes  . Smokeless tobacco: Never Used     Comment: hasn't smoked in 3 weeks   . Alcohol Use: No  . Drug Use: No  . Sexually Active: Not on file   Other Topics Concern  . Not on file   Social History Narrative  . No narrative on file   Family History  Problem Relation Age of Onset  . Stroke Mother   . Diabetes Mother   . Hypertension Mother   . Hyperlipidemia Mother   . Hypertension Father   . Hyperlipidemia Father   . Colon cancer Neg Hx  Past Surgical History  Procedure Date  . Bunionectomy   . Colonoscopy 10/31/2011     LARGE Internal hemorrhoids, S/P BANDING/ ADENOMATOUS APPEARING Polyp in the ascending colon/ multiple polyps in the  descending, sigmoid   colon and rectumSIMPLE ADENOMAS, SURVEILLANCE 2023  . Esophagogastroduodenoscopy 10/31/2011    LARGE Hiatal hernia/Mild gastritis    Review of Systems: Systems reviewed and as per HPI, otherwise negative.  Physical Exam: Blood pressure 172/85, pulse 78, temperature 98.1 F (36.7 C), resp. rate 14, SpO2 99.00%. BP 148/80  Pulse 78  Temp 98.1 F (36.7 C)  Resp 12  SpO2 99%  General Appearance:    asleep. Arousable. Groggy. Falls back asleep. Answers questions and follows commands. Oriented. Response time is slightly slow., cooperative, no distress, appears  stated age  Head:    Normocephalic, without obvious abnormality, atraumatic  Eyes:    PERRL, conjunctiva/corneas clear, EOM's intact, fundi    benign, both eyes  Ears:    Normal TM's and external ear canals, both ears  Nose:   Nares normal, septum midline, mucosa normal, no drainage    or sinus tenderness  Throat:   Lips, mucosa, and tongue normal; teeth and gums normal. No lip or tongue laceration.   Neck:   Supple, symmetrical, trachea midline, no adenopathy;    thyroid:  no enlargement/tenderness/nodules; no carotid   bruit or JVD  Back:     Symmetric, no curvature, ROM normal, no CVA tenderness  Lungs:     Clear to auscultation bilaterally, respirations unlabored  Chest Wall:    No tenderness or deformity   Heart:    Regular rate and rhythm, S1 and S2 normal, no murmur, rub   or gallop  Breast Exam:    deferred   Abdomen:     Soft, non-tender, bowel sounds active all four quadrants,    no masses, no organomegaly  Genitalia:   deferred   Rectal:   deferred   Extremities:   Extremities normal, atraumatic, no cyanosis or edema  Pulses:   2+ and symmetric all extremities  Skin:   Skin color, texture, turgor normal, no rashes or lesions  Lymph nodes:   Cervical, supraclavicular, and axillary nodes normal  Neurologic:   CNII-XII intact, left arm and leg possibly slightly weaker than right. Finger to nose slower on the left. Babinski's negative. Deep tendon reflexes 1+.      Psychiatric: Calm and cooperative.  Lab results: Basic Metabolic Panel:  Basename 02/03/12 0821  NA 141  K 3.7  CL 101  CO2 18*  GLUCOSE 158*  BUN 17  CREATININE 0.92  CALCIUM 10.3  MG --  PHOS --   Liver Function Tests:  Basename 02/03/12 0821  AST 19  ALT 14  ALKPHOS 90  BILITOT 0.3  PROT 7.8  ALBUMIN 4.3   No results found for this basename: LIPASE:2,AMYLASE:2 in the last 72 hours No results found for this basename: AMMONIA:2 in the last 72 hours CBC:  Basename 02/03/12 0821  WBC 5.6    NEUTROABS 1.6*  HGB 13.1  HCT 39.8  MCV 88.1  PLT 266   Cardiac Enzymes:  Basename 02/03/12 0821  CKTOTAL --  CKMB --  CKMBINDEX --  TROPONINI <0.30   Imaging results:  Ct Head Wo Contrast  02/03/2012  *RADIOLOGY REPORT*  Clinical Data: Seizure  CT HEAD WITHOUT CONTRAST  Technique:  Contiguous axial images were obtained from the base of the skull through the vertex without contrast.  Comparison: 08/06/2011  Findings:  Minimal atrophy. Stable ventricular morphology. No midline shift or mass effect. Old infarcts right parietal lobe, left thalamus and inferior right cerebellum. No intracranial hemorrhage, mass lesion evidence of acute infarction. No extra-axial fluid collections. Streak artifacts at skull base. Chronic opacification of an expanded right ethmoid air cell question mucocele. Tiny mucosal retention cyst left sphenoid sinus. Bones unremarkable.  IMPRESSION: Old infarcts right parietal lobe, left thalamus and inferior right cerebellum. No acute intracranial abnormalities.  Findings called to Dr. Estell Harpin on 02/03/2012 at 0820 hours.   Original Report Authenticated By: Ulyses Southward, M.D.    Mr Laqueta Jean Wo Contrast  02/03/2012  *RADIOLOGY REPORT*  Clinical Data: Seizure  MRI HEAD WITHOUT AND WITH CONTRAST  Technique:  Multiplanar, multiecho pulse sequences of the brain and surrounding structures were obtained according to standard protocol without and with intravenous contrast  Contrast: 13mL MULTIHANCE GADOBENATE DIMEGLUMINE 529 MG/ML IV SOLN  Comparison: Head CT same day.  MRI 08/06/2011.  Findings: The brainstem is unremarkable.  There are old small vessel infarctions within the right cerebellum.  There is an old infarction in the right middle cerebral artery territory affecting the parietal region, which was acute in May of this year.  This has progressed atrophy, encephalomalacia and gliosis.  There is restricted diffusion along the deep margins of this old infarction. I think this probably  relates to T2 shine through rather than acute extension.  Elsewhere, the brain shows old small vessel infarctions within the deep white matter. There are old thalamic lacunar infarctions.  No evidence of mass lesion, hemorrhage, hydrocephalus or extra-axial collection.  No mesial temporal lesion. No pituitary mass.  No inflammatory sinus disease.  No skull or skull base lesion.  Images described as postcontrast show a paucity of evidence of Gadolinium.  IMPRESSION: No identifiably acute finding.  Old right parietal cortical and subcortical infarction which has progressed to atrophy, encephalomalacia and gliosis.  Chronic small vessel change of the hemispheric white matter in the right cerebellum.   Original Report Authenticated By: Paulina Fusi, M.D.     Assessment & Plan: Principal Problem:  *Seizure, new onset. May be related to her previous stroke. Will start Keppra, order a EEG and consult neurology. She will be monitored and step down. Resume outpatient medications. Seizure precautions.  Active Problems:  Left-sided weakness, improving. MRI negative for new stroke. Could be Todd's paralysis. Will get physical therapy and occupational therapy consults. Continue aspirin. I suspect she may have had a seizure while asleep in addition to the witnessed seizure in the ambulance.   History of right MCA stroke: Patient no longer smokes.   High anion gap metabolic acidosis, likely lactic acidosis from seizure.   HTN (hypertension): Resume outpatient medication   GERD (gastroesophageal reflux disease)   Internal hemorrhoids, recent hemorrhoidectomy with Dr. Lovell Sheehan on 01/31/2012. Continue Proctofoam. Daily MiraLAX.  Hyperlipidemia  Cleveland Paiz L 02/03/2012, 2:04 PM

## 2012-02-03 NOTE — Progress Notes (Signed)
Offsite portable EEG completed at [facility]. 

## 2012-02-03 NOTE — ED Notes (Signed)
Pt given decaf coffee per request after passing swallow screen.  nad noted at this time.

## 2012-02-03 NOTE — ED Notes (Signed)
Awoke at 0725 w/complaint of inability to move L side.  Family called EMS.  In route had grand mal seizure beginning in L leg, then generalizing.

## 2012-02-04 ENCOUNTER — Encounter (HOSPITAL_COMMUNITY): Payer: Self-pay | Admitting: General Surgery

## 2012-02-04 ENCOUNTER — Telehealth: Payer: Self-pay | Admitting: Family Medicine

## 2012-02-04 DIAGNOSIS — I10 Essential (primary) hypertension: Secondary | ICD-10-CM | POA: Diagnosis not present

## 2012-02-04 DIAGNOSIS — I6350 Cerebral infarction due to unspecified occlusion or stenosis of unspecified cerebral artery: Secondary | ICD-10-CM

## 2012-02-04 DIAGNOSIS — R569 Unspecified convulsions: Secondary | ICD-10-CM | POA: Diagnosis not present

## 2012-02-04 DIAGNOSIS — G4710 Hypersomnia, unspecified: Secondary | ICD-10-CM | POA: Diagnosis not present

## 2012-02-04 DIAGNOSIS — Z8673 Personal history of transient ischemic attack (TIA), and cerebral infarction without residual deficits: Secondary | ICD-10-CM | POA: Diagnosis not present

## 2012-02-04 DIAGNOSIS — G40401 Other generalized epilepsy and epileptic syndromes, not intractable, with status epilepticus: Secondary | ICD-10-CM | POA: Diagnosis not present

## 2012-02-04 LAB — URINALYSIS, ROUTINE W REFLEX MICROSCOPIC
Protein, ur: NEGATIVE mg/dL
pH: 5.5 (ref 5.0–8.0)

## 2012-02-04 LAB — BASIC METABOLIC PANEL
CO2: 27 mEq/L (ref 19–32)
Chloride: 107 mEq/L (ref 96–112)
Creatinine, Ser: 0.89 mg/dL (ref 0.50–1.10)
GFR calc Af Amer: 76 mL/min — ABNORMAL LOW (ref >90)
Potassium: 3.9 mEq/L (ref 3.5–5.1)
Sodium: 141 mEq/L (ref 135–145)

## 2012-02-04 LAB — URINE MICROSCOPIC-ADD ON

## 2012-02-04 MED ORDER — DOCUSATE SODIUM 100 MG PO CAPS
100.0000 mg | ORAL_CAPSULE | Freq: Two times a day (BID) | ORAL | Status: DC
  Administered 2012-02-04 (×2): 100 mg via ORAL
  Filled 2012-02-04 (×2): qty 1

## 2012-02-04 NOTE — Evaluation (Addendum)
Physical Therapy Evaluation Patient Details Name: [name] MRN: [id number] DOB: 1945-07-23 Today's Date: 02/04/2012 Time: 3086-5784 PT Time Calculation (min): 44 min  PT Assessment / Plan / Recommendation Clinical Impression  Pt with decreased mm strength and decreased balance who will benefit from skilled PT to improve safety and I.    PT Assessment  Patient needs continued PT services    Follow Up Recommendations  Outpatient PT    Does the patient have the potential to tolerate intense rehabilitation    N/A  Barriers to Discharge  none      Equipment Recommendations  Rolling walker with 5" wheels       Frequency Min 3X/week    Precautions / Restrictions Precautions Precautions: Fall Restrictions Weight Bearing Restrictions: No         Mobility  Bed Mobility Bed Mobility: Rolling Right;Rolling Left;Left Sidelying to Sit;Sitting - Scoot to [place] of Bed;Supine to Sit;Scooting to [facility] Rolling Right: 6: Modified independent (Device/Increase time) Rolling Left: 6: Modified independent (Device/Increase time) Left Sidelying to Sit: 4: Min assist Supine to Sit: 4: Min assist Sitting - Scoot to Edge of Bed: 6: Modified independent (Device/Increase time) Scooting to [facility]: 6: Modified independent (Device/Increase time) Transfers Transfers: Sit to Stand Sit to Stand: 6: Modified independent (Device/Increase time) Ambulation/Gait Ambulation/Gait Assistance: 5: Supervision Ambulation Distance (Feet): 180 Feet Assistive device: Rolling walker Ambulation/Gait Assistance Details: Pt tends  to veer right or Left with and without walker.  Gt safety is improved with walker needs verbal cuing to keep head forward as well as to keep walker going strainght. Gait Pattern: Within Functional Limits Gait velocity: normal         Exercises General Exercises - Lower Extremity Heel Slides: Strengthening;10 reps;Supine (bridging) Hip ABduction/ADduction: Strengthening;Both;10  reps;Sidelying Hip Flexion/Marching: Strengthening;Both;10 reps Heel Raises: Strengthening;Both;10 reps Mini-Sqauts: Strengthening;Both;10 reps   PT Diagnosis: Difficulty walking;Generalized weakness  PT Problem List: Decreased strength;Decreased balance PT Treatment Interventions: Gait training;Therapeutic activities;Therapeutic exercise;Balance training   PT Goals Acute Rehab PT Goals PT Goal Formulation: With patient Time For Goal Achievement: 02/06/12 Potential to Achieve Goals: Good Pt will go Supine/Side to Sit: Independently PT Goal: Supine/Side to Sit - Progress: Goal set today Pt will Ambulate: >150 feet;with rolling walker (keeping walker straight) PT Goal: Ambulate - Progress: Goal set today Pt will Perform Home Exercise Program: with supervision, verbal cues required/provided PT Goal: Perform Home Exercise Program - Progress: Goal set today  Visit Information  Last PT Received On: 02/04/12    Subjective Data  Subjective: Pt states that she normally uses no assistive device to ambulate with. Patient Stated Goal: To go home   Prior Functioning  Home Living Lives With: Daughter Available Help at Discharge: Family Type of Home: House Home Access: Level entry Home Layout: One level Bathroom Shower/Tub: Network engineer: None Prior Function Level of Independence: Independent Able to Take Stairs?: Yes Driving: No Vocation: Retired Musician: No difficulties    Cognition  Overall Cognitive Status: Appears within functional limits for tasks assessed/performed Arousal/Alertness: Awake/alert Orientation Level: Appears intact for tasks assessed Behavior During Session: [facility] for tasks performed    Extremity/Trunk Assessment Right Lower Extremity Assessment RLE ROM/Strength/Tone: Deficits RLE ROM/Strength/Tone Deficits: strength generally 4/5 except hip abduction and extensors which are 3/5  RLE  Sensation: WFL - Light Touch RLE Coordination: WFL - gross/fine motor Left Lower Extremity Assessment LLE ROM/Strength/Tone: Deficits LLE ROM/Strength/Tone Deficits: generally 4/5 except for hip abduction and extension which are  3/5 LLE Sensation: WFL - Light Touch LLE Coordination: WFL - gross/fine motor   Balance Balance Balance Assessed: Yes Static Standing Balance Single Leg Stance - Right Leg:  (Pt attempts to lift leg but unable to hold) Single Leg Stance - Left Leg:  (Pt attempts to lift leg but is unable to hold)  End of Session PT - End of Session Equipment Utilized During Treatment: Gait belt Activity Tolerance: Patient tolerated treatment well Patient left: in chair;with call bell/phone within reach;with family/visitor present Nurse Communication: Mobility status  GP     RUSSELL,CINDY 02/04/2012, 11:40 AM  [id number] CI G8979 CI G8980 CI

## 2012-02-04 NOTE — Consult Note (Signed)
[facility] Acey Woodfield A. Gerilyn Pilgrim, MD     [email or web address]         [name] is an 66 y.o. female.  HPI:   Past Medical History  Diagnosis Date  . Hypertension   . GERD (gastroesophageal reflux disease)   . Stroke May 2013  . Hyperlipidemia     Past Surgical History  Procedure Date  . Bunionectomy   . Colonoscopy 10/31/2011     LARGE Internal hemorrhoids, S/P BANDING/ ADENOMATOUS APPEARING Polyp in the ascending colon/ multiple polyps in the  descending, sigmoid   colon and rectumSIMPLE ADENOMAS, SURVEILLANCE 2023  . Esophagogastroduodenoscopy 10/31/2011    LARGE Hiatal hernia/Mild gastritis    Family History  Problem Relation Age of Onset  . Stroke Mother   . Diabetes Mother   . Hypertension Mother   . Hyperlipidemia Mother   . Hypertension Father   . Hyperlipidemia Father   . Colon cancer Neg Hx     Social History:  reports that she has quit smoking. Her smoking use included Cigarettes. She has a 50 pack-year smoking history. She has never used smokeless tobacco. She reports that she does not drink alcohol or use illicit drugs.  Allergies: No Known Allergies  Medications:  Prior to Admission medications   Medication Sig Start Date End Date Taking? Authorizing Provider  acetaminophen (TYLENOL) 500 MG tablet Take 1,000 mg by mouth every 6 (six) hours as needed. Pain.   Yes Historical Provider, MD  aspirin 325 MG tablet Take 1 tablet (325 mg total) by mouth daily. 08/07/11 08/06/12 Yes Nimish Normajean Glasgow, MD  enalapril (VASOTEC) 5 MG tablet Take 1 tablet (5 mg total) by mouth daily. 01/09/12  Yes Salley Scarlet, MD  HYDROcodone-acetaminophen (NORCO) 5-325 MG per tablet Take 1-2 tablets by mouth every 4 (four) hours as needed for pain. 01/31/12  Yes Dalia Heading, MD  lansoprazole (PREVACID) 30 MG capsule Take 30 mg by mouth daily.   Yes Historical Provider, MD  PROCTOFOAM [facility] rectal foam USE ONE APPLICATORFUL RECTALLY AS DIRECTED TWICE DAILY FOR 7 DAYS 01/24/12   Yes Tiffany Kocher, PA  simvastatin (ZOCOR) 20 MG tablet Take 1 tablet (20 mg total) by mouth daily at 6 PM. 01/09/12 01/08/13 Yes Salley Scarlet, MD  omeprazole (PRILOSEC) 20 MG capsule Take 1 capsule (20 mg total) by mouth daily. 01/09/12   Salley Scarlet, MD    Scheduled Meds:   . aspirin  325 mg Oral Daily  . enalapril  5 mg Oral Daily  . hydrocortisone-pramoxine   Rectal BID  . levETIRAcetam  500 mg Oral BID  . pantoprazole  40 mg Oral Daily  . pneumococcal 23 valent vaccine  0.5 mL Intramuscular Tomorrow-1000  . simvastatin  20 mg Oral q1800  . sodium chloride  3 mL Intravenous Q12H   Continuous Infusions:   . [EXPIRED] sodium chloride 100 mL/hr at 02/03/12 1445   PRN Meds:.acetaminophen, [COMPLETED] gadobenate dimeglumine, HYDROcodone-acetaminophen, magnesium hydroxide, ondansetron (ZOFRAN) IV, ondansetron   Results for orders placed during the hospital encounter of 02/03/12 (from the past 48 hour(s))  CBC WITH DIFFERENTIAL     Status: Abnormal   Collection Time   02/03/12  8:21 AM      Component Value Range Comment   WBC 5.6  4.0 - 10.5 K/uL    RBC 4.52  3.87 - 5.11 MIL/uL    Hemoglobin 13.1  12.0 - 15.0 g/dL    HCT 16.1  09.6 - 04.5 %  MCV 88.1  78.0 - 100.0 fL    MCH 29.0  26.0 - 34.0 pg    MCHC 32.9  30.0 - 36.0 g/dL    RDW 16.1  09.6 - 04.5 %    Platelets 266  150 - 400 K/uL    Neutrophils Relative 29 (*) 43 - 77 %    Neutro Abs 1.6 (*) 1.7 - 7.7 K/uL    Lymphocytes Relative 62 (*) 12 - 46 %    Lymphs Abs 3.5  0.7 - 4.0 K/uL    Monocytes Relative 7  3 - 12 %    Monocytes Absolute 0.4  0.1 - 1.0 K/uL    Eosinophils Relative 2  0 - 5 %    Eosinophils Absolute 0.1  0.0 - 0.7 K/uL    Basophils Relative 0  0 - 1 %    Basophils Absolute 0.0  0.0 - 0.1 K/uL   COMPREHENSIVE METABOLIC PANEL     Status: Abnormal   Collection Time   02/03/12  8:21 AM      Component Value Range Comment   Sodium 141  135 - 145 mEq/L    Potassium 3.7  3.5 - 5.1 mEq/L    Chloride  101  96 - 112 mEq/L    CO2 18 (*) 19 - 32 mEq/L    Glucose, Bld 158 (*) 70 - 99 mg/dL    BUN 17  6 - 23 mg/dL    Creatinine, Ser 4.09  0.50 - 1.10 mg/dL    Calcium 81.1  8.4 - 10.5 mg/dL    Total Protein 7.8  6.0 - 8.3 g/dL    Albumin 4.3  3.5 - 5.2 g/dL    AST 19  0 - 37 U/L    ALT 14  0 - 35 U/L    Alkaline Phosphatase 90  39 - 117 U/L    Total Bilirubin 0.3  0.3 - 1.2 mg/dL    GFR calc non Af Amer 63 (*) >90 mL/min    GFR calc Af Amer 73 (*) >90 mL/min   TROPONIN I     Status: Normal   Collection Time   02/03/12  8:21 AM      Component Value Range Comment   Troponin I <0.30  <0.30 ng/mL   GLUCOSE, CAPILLARY     Status: Abnormal   Collection Time   02/03/12  7:45 PM      Component Value Range Comment   Glucose-Capillary 146 (*) 70 - 99 mg/dL    Comment 1 Documented in Chart      Comment 2 Notify RN     BASIC METABOLIC PANEL     Status: Abnormal   Collection Time   02/04/12  4:46 AM      Component Value Range Comment   Sodium 141  135 - 145 mEq/L    Potassium 3.9  3.5 - 5.1 mEq/L    Chloride 107  96 - 112 mEq/L    CO2 27  19 - 32 mEq/L    Glucose, Bld 95  70 - 99 mg/dL    BUN 18  6 - 23 mg/dL    Creatinine, Ser 9.14  0.50 - 1.10 mg/dL    Calcium 9.4  8.4 - 78.2 mg/dL    GFR calc non Af Amer 66 (*) >90 mL/min    GFR calc Af Amer 76 (*) >90 mL/min   URINALYSIS, ROUTINE W REFLEX MICROSCOPIC     Status: Abnormal   Collection Time  02/04/12  7:15 AM      Component Value Range Comment   Color, Urine YELLOW  YELLOW    APPearance CLEAR  CLEAR    Specific Gravity, Urine 1.010  1.005 - 1.030    pH 5.5  5.0 - 8.0    Glucose, UA NEGATIVE  NEGATIVE mg/dL    Hgb urine dipstick MODERATE (*) NEGATIVE    Bilirubin Urine NEGATIVE  NEGATIVE    Ketones, ur NEGATIVE  NEGATIVE mg/dL    Protein, ur NEGATIVE  NEGATIVE mg/dL    Urobilinogen, UA 0.2  0.0 - 1.0 mg/dL    Nitrite NEGATIVE  NEGATIVE    Leukocytes, UA NEGATIVE  NEGATIVE   URINE MICROSCOPIC-ADD ON     Status: Normal    Collection Time   02/04/12  7:15 AM      Component Value Range Comment   RBC / HPF 7-10  <3 RBC/hpf     Ct Head Wo Contrast  02/03/2012  *RADIOLOGY REPORT*  Clinical Data: Seizure  CT HEAD WITHOUT CONTRAST  Technique:  Contiguous axial images were obtained from the base of the skull through the vertex without contrast.  Comparison: 08/06/2011  Findings: Minimal atrophy. Stable ventricular morphology. No midline shift or mass effect. Old infarcts right parietal lobe, left thalamus and inferior right cerebellum. No intracranial hemorrhage, mass lesion evidence of acute infarction. No extra-axial fluid collections. Streak artifacts at skull base. Chronic opacification of an expanded right ethmoid air cell question mucocele. Tiny mucosal retention cyst left sphenoid sinus. Bones unremarkable.  IMPRESSION: Old infarcts right parietal lobe, left thalamus and inferior right cerebellum. No acute intracranial abnormalities.  Findings called to Dr. Estell Harpin on 02/03/2012 at 0820 hours.   Original Report Authenticated By: Ulyses Southward, M.D.    Mr Laqueta Jean Wo Contrast  02/03/2012  *RADIOLOGY REPORT*  Clinical Data: Seizure  MRI HEAD WITHOUT AND WITH CONTRAST  Technique:  Multiplanar, multiecho pulse sequences of the brain and surrounding structures were obtained according to standard protocol without and with intravenous contrast  Contrast: 13mL MULTIHANCE GADOBENATE DIMEGLUMINE 529 MG/ML IV SOLN  Comparison: Head CT same day.  MRI 08/06/2011.  Findings: The brainstem is unremarkable.  There are old small vessel infarctions within the right cerebellum.  There is an old infarction in the right middle cerebral artery territory affecting the parietal region, which was acute in May of this year.  This has progressed atrophy, encephalomalacia and gliosis.  There is restricted diffusion along the deep margins of this old infarction. I think this probably relates to T2 shine through rather than acute extension.  Elsewhere, the  brain shows old small vessel infarctions within the deep white matter. There are old thalamic lacunar infarctions.  No evidence of mass lesion, hemorrhage, hydrocephalus or extra-axial collection.  No mesial temporal lesion. No pituitary mass.  No inflammatory sinus disease.  No skull or skull base lesion.  Images described as postcontrast show a paucity of evidence of Gadolinium.  IMPRESSION: No identifiably acute finding.  Old right parietal cortical and subcortical infarction which has progressed to atrophy, encephalomalacia and gliosis.  Chronic small vessel change of the hemispheric white matter in the right cerebellum.   Original Report Authenticated By: Paulina Fusi, M.D.    ECHO [phone] - Left ventricle: The cavity size was normal. Wall thickness was increased in a pattern of mild LVH. Systolic function was normal. The estimated ejection fraction was in the range of 55% to 60%. Wall motion was normal; there were no regional wall  motion abnormalities. Doppler parameters are consistent with abnormal left ventricular relaxation (grade 1 diastolic dysfunction). - Left atrium: The atrium was mildly to moderately dilated. - Atrial septum: No defect or patent foramen ovale was identified.    Review of Systems  Constitutional: Negative.   Eyes: Negative.   Respiratory: Negative.   Cardiovascular: Negative.   Gastrointestinal: Negative.   Genitourinary: Negative.   Musculoskeletal: Negative.   Skin: Negative.   Neurological: Positive for headaches.  Psychiatric/Behavioral: Negative.    Blood pressure 156/87, pulse 92, temperature 98.1 F (36.7 C), temperature source Oral, resp. rate 12, height 5' (1.524 m), weight 67 kg (147 lb 11.3 oz), SpO2 97.00%. Physical Exam  Assessment/Plan:  1. Resolved status epilepticus in a patient with previous left hemispheric MCA infarct. The large cortical infarct serves as the substrate for the patient to seizure. Given the large cortical infarct, the  patient is at increased risk of having recurrent events and therefore agree with initiation of antiepileptic medications. She has been started on Keppra and so far has tolerated this. She apparently has had some blood pressure elevation recently but it is actually fine in the hospital. Continue with risk factor reduction including blood pressure control and the use of aspirin. 2. Likely significant obstructive sleep apnea syndrome. A sleep study will be recommended.  The patient is 66 year old black female who has a history of a recent stroke in May of this year. It appears that she had a large vessel cortical infarct involving the right MCA. The patient did have significant left-sided weakness but recovered well and appears to have essentially been at baseline be able to walk and talk without difficulties. The patient woke up on yesterday not feeling quite well. She started to get going on her day but subsequently developed shaking of the left side with twisting of the face. On route to the emergency room via EMS, the patient was noted to have a generalized tonoclonic seizure tom which the Patient is amnestic to. The patient's daughter is present in the room today and the patient and the daughter reports that she seemed to be at baseline regarding her cognition and the left-sided problems. The daughter did have concerns about patient's blood pressure being elevated over the last couple weeks or so. There has been increased psychosocial stresses however with the death of the patient's sister couple weeks ago. They had to travel to [place] to bury the sister. The patient does complain of having frontal headaches more in the left side today. The daughter also is concerned about the patient having witnessed apneas and gasping for breath associated with increased snoring recently.  GENERAL: This very pleasant female who is in no acute distress.  HEENT: Retro-palatal space is markedly reduced. Neck size fine however.  There is a mild left esotrotropia.  ABDOMEN: soft  EXTREMITIES: No edema   BACK: Unremarkable.  SKIN: Normal by inspection.    MENTAL STATUS: Alert and oriented. Speech, language and cognition are generally intact. Judgment and insight normal.   CRANIAL NERVES: Pupils are equal, round and reactive to light and accomodation; extra ocular movements are full, there is no significant nystagmus; visual fields shows a dense left homonymous hemianopia; upper and lower facial muscles are normal in strength and symmetric, there is no flattening of the nasolabial folds; tongue is midline; uvula is midline; shoulder elevation is normal.  MOTOR: The patient seemed to have proximal muscle weakness in both upper and lower extremities. She does report having significant right shoulder pain  apparently from bursitis. The proximal muscle weakness graded as 4/5 both upper and lower extremities. Distal strength is 5/5. Bulk and tone are normal. There is no pronator drift.  COORDINATION: Left finger to nose is normal, right finger to nose is normal, No rest tremor; no intention tremor; no postural tremor; no bradykinesia.  REFLEXES: Deep tendon reflexes are symmetrical and normal. Babinski reflexes are flexor bilaterally.   SENSATION: Normal to light touch, temperature, and pinprick.  Brain MRI is reviewed in person. There is a large right parietal temporal encephalomalacia seen on T1 and essentially all the different sequences. There is increased signal seen on the diffusion-weighted image but this is also seen on the ADC scans. The increase as well as noted essentially the area of the old infarct involving the right parietal and right temporal area. This suggests that this is not an acute infarct but likely due to the patient's seizures.  [name] 02/04/2012, 8:10 AM

## 2012-02-04 NOTE — Telephone Encounter (Signed)
Shower chair ordered.

## 2012-02-04 NOTE — Procedures (Signed)
[facility] Shankar Silber A. Gerilyn Pilgrim, MD     [email or web address]          FACILITY: [facility]  PHYSICIAN: Shalon Salado A. Gerilyn Pilgrim, M.D.  DATE OF PROCEDURE: 02/03/2012 EEG INTERPRETATION  INDICATION: This is a 66 year old presents with multiple seizures. She has had a previous stroke a few months ago.   MEDICATIONS:  Scheduled Meds:   . aspirin  325 mg Oral Daily  . docusate sodium  100 mg Oral BID  . enalapril  5 mg Oral Daily  . hydrocortisone-pramoxine   Rectal BID  . levETIRAcetam  500 mg Oral BID  . pantoprazole  40 mg Oral Daily  . [COMPLETED] pneumococcal 23 valent vaccine  0.5 mL Intramuscular Tomorrow-1000  . simvastatin  20 mg Oral q1800  . sodium chloride  3 mL Intravenous Q12H   Continuous Infusions:   . [EXPIRED] sodium chloride 100 mL/hr at 02/03/12 1445   PRN Meds:.acetaminophen, HYDROcodone-acetaminophen, magnesium hydroxide, ondansetron (ZOFRAN) IV, ondansetron   ANALYSIS: A 16-channel recording using standard 10/20 measurements is conducted for 21 minutes. There is a low-voltage electrocortical posterior dominant rhythm of 10 Hz which attenuates with eye opening. There is beta activity observed in the frontal areas. Awake and sleep activities are recorded. K complexes and sleep spindles are observed. Photic stimulation is carried out without abnormal changes in the background activity. There is no focal or lateralized slowing. There is no epileptiform activity observed.   IMPRESSION:  This is a normal study of the awake and sleep states. Single recording does not rule out epileptic seizures however. If clinically indicated, a prolonged or sleep deprived recording may be useful.  Salimatou Simone A. Gerilyn Pilgrim, M.D. Diplomate, Biomedical engineer of Psychiatry and Neurology ( Neurology).

## 2012-02-04 NOTE — Progress Notes (Signed)
UR Chart Review Completed  

## 2012-02-04 NOTE — Progress Notes (Signed)
Report called to V. Didley, LPN on department 300. Patient being transferred to room 316 as med-surg patient. Patient alert, oriented and in stable condition at the time of transfer. Patient being transported in wheelchair by NT.

## 2012-02-04 NOTE — Progress Notes (Signed)
Subjective: This lady was admitted yesterday with what appears to be a seizure originating from the previous stroke. She feels perfectly well now, no evidence of weakness. No further seizures. She has been started on Keppra.           Physical Exam: Blood pressure 156/87, pulse 92, temperature 98.1 F (36.7 C), temperature source Oral, resp. rate 12, height 5' (1.524 m), weight 67 kg (147 lb 11.3 oz), SpO2 97.00%. She looks systemically well. Heart sounds are present in sinus rhythm. She is alert and orientated. There are no new focal neurological signs. Lung fields are clear.   Investigations:  Recent Results (from the past 240 hour(s))  SURGICAL PCR SCREEN     Status: Normal   Collection Time   01/30/12 12:25 PM      Component Value Range Status Comment   MRSA, PCR NEGATIVE  NEGATIVE Final    Staphylococcus aureus NEGATIVE  NEGATIVE Final      Basic Metabolic Panel:  Basename 02/04/12 0446 02/03/12 0821  NA 141 141  K 3.9 3.7  CL 107 101  CO2 27 18*  GLUCOSE 95 158*  BUN 18 17  CREATININE 0.89 0.92  CALCIUM 9.4 10.3  MG -- --  PHOS -- --   Liver Function Tests:  [facility] 02/03/12 0821  AST 19  ALT 14  ALKPHOS 90  BILITOT 0.3  PROT 7.8  ALBUMIN 4.3     CBC:  Basename 02/03/12 0821  WBC 5.6  NEUTROABS 1.6*  HGB 13.1  HCT 39.8  MCV 88.1  PLT 266    Ct Head Wo Contrast  02/03/2012  *RADIOLOGY REPORT*  Clinical Data: Seizure  CT HEAD WITHOUT CONTRAST  Technique:  Contiguous axial images were obtained from the base of the skull through the vertex without contrast.  Comparison: 08/06/2011  Findings: Minimal atrophy. Stable ventricular morphology. No midline shift or mass effect. Old infarcts right parietal lobe, left thalamus and inferior right cerebellum. No intracranial hemorrhage, mass lesion evidence of acute infarction. No extra-axial fluid collections. Streak artifacts at skull base. Chronic opacification of an expanded right ethmoid air  cell question mucocele. Tiny mucosal retention cyst left sphenoid sinus. Bones unremarkable.  IMPRESSION: Old infarcts right parietal lobe, left thalamus and inferior right cerebellum. No acute intracranial abnormalities.  Findings called to Dr. Estell Harpin on 02/03/2012 at 0820 hours.   Original Report Authenticated By: Ulyses Southward, M.D.    Mr Laqueta Jean Wo Contrast  02/03/2012  *RADIOLOGY REPORT*  Clinical Data: Seizure  MRI HEAD WITHOUT AND WITH CONTRAST  Technique:  Multiplanar, multiecho pulse sequences of the brain and surrounding structures were obtained according to standard protocol without and with intravenous contrast  Contrast: 13mL MULTIHANCE GADOBENATE DIMEGLUMINE 529 MG/ML IV SOLN  Comparison: Head CT same day.  MRI 08/06/2011.  Findings: The brainstem is unremarkable.  There are old small vessel infarctions within the right cerebellum.  There is an old infarction in the right middle cerebral artery territory affecting the parietal region, which was acute in May of this year.  This has progressed atrophy, encephalomalacia and gliosis.  There is restricted diffusion along the deep margins of this old infarction. I think this probably relates to T2 shine through rather than acute extension.  Elsewhere, the brain shows old small vessel infarctions within the deep white matter. There are old thalamic lacunar infarctions.  No evidence of mass lesion, hemorrhage, hydrocephalus or extra-axial collection.  No mesial temporal lesion. No pituitary mass.  No inflammatory  sinus disease.  No skull or skull base lesion.  Images described as postcontrast show a paucity of evidence of Gadolinium.  IMPRESSION: No identifiably acute finding.  Old right parietal cortical and subcortical infarction which has progressed to atrophy, encephalomalacia and gliosis.  Chronic small vessel change of the hemispheric white matter in the right cerebellum.   Original Report Authenticated By: Paulina Fusi, M.D.       Medications: I  have reviewed the patient's current medications.  Impression: 1. Seizure secondary to previous CVA in the right MCA territory. This was in May 2013. 2. Hypertension. 3. Previous history of tobacco abuse, patient quit smoking. 4. GERD, stable. 5. Acidosis, resolved, likely secondary to seizure.     Plan: 1. Continue with current therapy including Keppra. 2. Await neurology consultation for any further recommendations, await EEG report. 3. Patient can transfer to the general medical floor.     LOS: 1 day   Wilson Singer Pager [phone]  02/04/2012, 7:29 AM

## 2012-02-05 DIAGNOSIS — I10 Essential (primary) hypertension: Secondary | ICD-10-CM | POA: Diagnosis not present

## 2012-02-05 DIAGNOSIS — R569 Unspecified convulsions: Secondary | ICD-10-CM | POA: Diagnosis not present

## 2012-02-05 DIAGNOSIS — Z8673 Personal history of transient ischemic attack (TIA), and cerebral infarction without residual deficits: Secondary | ICD-10-CM

## 2012-02-05 DIAGNOSIS — G40401 Other generalized epilepsy and epileptic syndromes, not intractable, with status epilepticus: Secondary | ICD-10-CM | POA: Diagnosis not present

## 2012-02-05 DIAGNOSIS — G4710 Hypersomnia, unspecified: Secondary | ICD-10-CM | POA: Diagnosis not present

## 2012-02-05 MED ORDER — LEVETIRACETAM 500 MG PO TABS: 500.0000 mg | ORAL_TABLET | Freq: Two times a day (BID) | ORAL | Status: DC

## 2012-02-05 NOTE — Care Management Note (Signed)
    Page 1 of 1   02/05/2012     1:32:03 PM   CARE MANAGEMENT NOTE 02/05/2012  Patient:  [name], [name]   Account Number:  [id number]  Date Initiated:  02/05/2012  Documentation initiated by:  Rosemary Holms  Subjective/Objective Assessment:   Pt admitted from home where she lives with her daughter. Seizures. Plan to dc home.     Action/Plan:   Anticipated DC Date:  02/05/2012   Anticipated DC Plan:  HOME/SELF CARE      DC Planning Services  CM consult      Choice offered to / List presented to:     DME arranged  Levan Hurst      DME agency  [facility]        Status of service:  Completed, signed off Medicare Important Message given?   (If response is "NO", the following Medicare IM given date fields will be blank) Date Medicare IM given:   Date Additional Medicare IM given:    Discharge Disposition:  HOME/SELF CARE  Per UR Regulation:    If discussed at Long Length of Stay Meetings, dates discussed:    Comments:  02/05/12 Rosemary Holms RN BSN CM Alroy Bailiff from [facility] delivered a rolling walker to the room prior to DC. Outpt sleep study to be handled by Dr. Estanislado Emms.

## 2012-02-05 NOTE — Discharge Summary (Signed)
Physician Discharge Summary  [name] GNF:[id number] DOB: 1945-08-07 DOA: 02/03/2012  PCP: Milinda Antis, MD  Admit date: 02/03/2012 Discharge date: 02/05/2012  Time spent: Greater than 30 minutes  Recommendations for Outpatient Follow-up:  1. Follow with neurology, Dr Gerilyn Pilgrim.   Discharge Diagnoses:  1. Seizures secondary to old CVA. 2. History of right MCA CVA. 3. Hypertension. 4. GERD.   Discharge Condition: Stable and improved.  Diet recommendation: Heart healthy diet.  Filed Weights   02/03/12 1400  Weight: 67 kg (147 lb 11.3 oz)    History of present illness:  This very pleasant 66 year old lady presents to the hospital with symptoms of left arm weakness and seizure. Please see initial history as outlined below: [name] is an 66 y.o. female with a history of previous right MCA stroke who came to the emergency room via EMS with left hemiparesis. She reportedly woke up with symptoms. Her daughter reports also that her left leg seemed to be shaking and she could not bear weight. Her face "was wrenched up". On route, patient had a generalized seizure, received IV Ativan. In the emergency room, she was noted to have an elevated blood pressure of 189/73. She was initially confused and unable to follow commands. She had mild weakness of the left arm and leg. Her confusion cleared and she became oriented and was able to follow commands. She reported no tongue laceration or lip laceration. No previous history of seizure. No incontinence. CT of the brain and MRI of the brain showed no acute stroke or bleed. She did not take her medications today, but has been compliant with her medications. Denies history of alcohol or drug use.  Hospital Course:  Patient had one seizure and then since she was in the hospital, she has had no further seizures. The weakness in the left arm appears to have regained strength and is back to her baseline now. It is still weaker secondary to previous  CVA. She was seen by neurology, Dr Gerilyn Pilgrim and he agreed with initiation of  Keppra. She has been stable the last 24 hours with no further seizures. She is stable to be discharged home. Dr. Gerilyn Pilgrim will see her in the outpatient setting for further investigation such as sleep studies. An EEG was unremarkable in the hospital.  Procedures:  EEG, unremarkable.   Consultations:  Neurology, Dr Gerilyn Pilgrim.  Discharge Exam: Filed Vitals:   02/04/12 1553 02/04/12 1821 02/04/12 2141 02/05/12 0610  BP: 146/77 152/72 157/77 148/82  Pulse: 60 71 66 60  Temp: 98 F (36.7 C) 97.4 F (36.3 C) 98.1 F (36.7 C) 97.5 F (36.4 C)  TempSrc: Oral Oral  Oral  Resp: 20 20 20 20   Height:      Weight:      SpO2: 100% 98% 99% 99%    General: She looks systemically well. She is alert and orientated. Heart sounds are present and normal without murmurs. Lung fields are clear.  Discharge Instructions  Discharge Orders    Future Appointments: Provider: Department: Dept Phone: Center:   05/07/2012 1:00 PM Salley Scarlet, MD [facility] [phone] [facility]     Future Orders Please Complete By Expires   Diet - low sodium heart healthy      Increase activity slowly          Medication List     As of 02/05/2012  8:14 AM    TAKE these medications         acetaminophen 500 MG  tablet   Commonly known as: TYLENOL   Take 1,000 mg by mouth every 6 (six) hours as needed. Pain.      aspirin 325 MG tablet   Take 1 tablet (325 mg total) by mouth daily.      enalapril 5 MG tablet   Commonly known as: VASOTEC   Take 1 tablet (5 mg total) by mouth daily.      HYDROcodone-acetaminophen 5-325 MG per tablet   Commonly known as: NORCO/VICODIN   Take 1-2 tablets by mouth every 4 (four) hours as needed for pain.      lansoprazole 30 MG capsule   Commonly known as: PREVACID   Take 30 mg by mouth daily.      levETIRAcetam 500 MG tablet   Commonly known as: KEPPRA   Take 1 tablet (500 mg total) by  mouth 2 (two) times daily.      omeprazole 20 MG capsule   Commonly known as: PRILOSEC   Take 1 capsule (20 mg total) by mouth daily.      PROCTOFOAM HC rectal foam   Generic drug: hydrocortisone-pramoxine   USE ONE APPLICATORFUL RECTALLY AS DIRECTED TWICE DAILY FOR 7 DAYS      simvastatin 20 MG tablet   Commonly known as: ZOCOR   Take 1 tablet (20 mg total) by mouth daily at 6 PM.          The results of significant diagnostics from this hospitalization (including imaging, microbiology, ancillary and laboratory) are listed below for reference.    Significant Diagnostic Studies: Ct Head Wo Contrast  02/03/2012  *RADIOLOGY REPORT*  Clinical Data: Seizure  CT HEAD WITHOUT CONTRAST  Technique:  Contiguous axial images were obtained from the base of the skull through the vertex without contrast.  Comparison: 08/06/2011  Findings: Minimal atrophy. Stable ventricular morphology. No midline shift or mass effect. Old infarcts right parietal lobe, left thalamus and inferior right cerebellum. No intracranial hemorrhage, mass lesion evidence of acute infarction. No extra-axial fluid collections. Streak artifacts at skull base. Chronic opacification of an expanded right ethmoid air cell question mucocele. Tiny mucosal retention cyst left sphenoid sinus. Bones unremarkable.  IMPRESSION: Old infarcts right parietal lobe, left thalamus and inferior right cerebellum. No acute intracranial abnormalities.  Findings called to Dr. Estell Harpin on 02/03/2012 at 0820 hours.   Original Report Authenticated By: Ulyses Southward, M.D.    Mr Laqueta Jean Wo Contrast  02/03/2012  *RADIOLOGY REPORT*  Clinical Data: Seizure  MRI HEAD WITHOUT AND WITH CONTRAST  Technique:  Multiplanar, multiecho pulse sequences of the brain and surrounding structures were obtained according to standard protocol without and with intravenous contrast  Contrast: 13mL MULTIHANCE GADOBENATE DIMEGLUMINE 529 MG/ML IV SOLN  Comparison: Head CT same day.  MRI  08/06/2011.  Findings: The brainstem is unremarkable.  There are old small vessel infarctions within the right cerebellum.  There is an old infarction in the right middle cerebral artery territory affecting the parietal region, which was acute in May of this year.  This has progressed atrophy, encephalomalacia and gliosis.  There is restricted diffusion along the deep margins of this old infarction. I think this probably relates to T2 shine through rather than acute extension.  Elsewhere, the brain shows old small vessel infarctions within the deep white matter. There are old thalamic lacunar infarctions.  No evidence of mass lesion, hemorrhage, hydrocephalus or extra-axial collection.  No mesial temporal lesion. No pituitary mass.  No inflammatory sinus disease.  No skull or skull base  lesion.  Images described as postcontrast show a paucity of evidence of Gadolinium.  IMPRESSION: No identifiably acute finding.  Old right parietal cortical and subcortical infarction which has progressed to atrophy, encephalomalacia and gliosis.  Chronic small vessel change of the hemispheric white matter in the right cerebellum.   Original Report Authenticated By: Paulina Fusi, M.D.     Microbiology: Recent Results (from the past 240 hour(s))  SURGICAL PCR SCREEN     Status: Normal   Collection Time   01/30/12 12:25 PM      Component Value Range Status Comment   MRSA, PCR NEGATIVE  NEGATIVE Final    Staphylococcus aureus NEGATIVE  NEGATIVE Final      Labs: Basic Metabolic Panel:  Lab 02/04/12 1610 02/03/12 0821 01/30/12 1100  NA 141 141 140  K 3.9 3.7 4.2  CL 107 101 104  CO2 27 18* 27  GLUCOSE 95 158* 97  BUN 18 17 19   CREATININE 0.89 0.92 0.95  CALCIUM 9.4 10.3 9.8  MG -- -- --  PHOS -- -- --   Liver Function Tests:  Lab 02/03/12 0821  AST 19  ALT 14  ALKPHOS 90  BILITOT 0.3  PROT 7.8  ALBUMIN 4.3     CBC:  Lab 02/03/12 0821 01/30/12 1130  WBC 5.6 4.7  NEUTROABS 1.6* 2.2  HGB 13.1 12.6    HCT 39.8 37.9  MCV 88.1 87.1  PLT 266 283   Cardiac Enzymes:  Lab 02/03/12 0821  CKTOTAL --  CKMB --  CKMBINDEX --  TROPONINI <0.30     CBG:  Lab 02/03/12 1945  GLUCAP 146*       Signed:  GOSRANI,NIMISH C  [facility] 02/05/2012, 8:14 AM

## 2012-02-05 NOTE — Plan of Care (Signed)
Problem: Discharge Progression Outcomes Goal: Discharge plan in place and appropriate Outcome: Completed/Met Date Met:  02/05/12 Pt to have out pt sleep study Goal: Pain controlled with appropriate interventions Outcome: Completed/Met Date Met:  02/05/12 denies Goal: Complications resolved/controlled Outcome: Completed/Met Date Met:  02/05/12 No seizure activity Goal: Other Discharge Outcomes/Goals Outcome: Completed/Met Date Met:  02/05/12 Discharged to home with daughter Pt to have walker delivered to home.  Daughter was instructed by Dr Doristine Section to call  Dr Gerilyn Pilgrim for follow up  I gave them Dr Harriett Sine office number

## 2012-02-05 NOTE — Progress Notes (Signed)
[facility] Tyla Burgner A. Gerilyn Pilgrim, MD     [email or web address]         Subjective: Interval History:  Objective: Vital signs in last 24 hours: Temp:  [97.4 F (36.3 C)-98.2 F (36.8 C)] 97.5 F (36.4 C) (11/06 0610) Pulse Rate:  [60-71] 60  (11/06 0610) Resp:  [15-20] 20  (11/06 0610) BP: (146-160)/(67-82) 148/82 mmHg (11/06 0610) SpO2:  [98 %-100 %] 99 % (11/06 0610)  Intake/Output from previous day: 11/05 0701 - 11/06 0700 In: 360 [P.O.:360] Out: -  Intake/Output this shift:   Nutritional status: Cardiac    Lab Results:  Basename 02/04/12 0446 02/03/12 0821  WBC -- 5.6  HGB -- 13.1  HCT -- 39.8  PLT -- 266  NA 141 141  K 3.9 3.7  CL 107 101  CO2 27 18*  GLUCOSE 95 158*  BUN 18 17  CREATININE 0.89 0.92  CALCIUM 9.4 10.3  LABA1C -- --   Lipid Panel No results found for this basename: CHOL,TRIG,HDL,CHOLHDL,VLDL,LDLCALC in the last 72 hours  Studies/Results: Mr Laqueta Jean Wo Contrast  02/03/2012  *RADIOLOGY REPORT*  Clinical Data: Seizure  MRI HEAD WITHOUT AND WITH CONTRAST  Technique:  Multiplanar, multiecho pulse sequences of the brain and surrounding structures were obtained according to standard protocol without and with intravenous contrast  Contrast: 13mL MULTIHANCE GADOBENATE DIMEGLUMINE 529 MG/ML IV SOLN  Comparison: Head CT same day.  MRI 08/06/2011.  Findings: The brainstem is unremarkable.  There are old small vessel infarctions within the right cerebellum.  There is an old infarction in the right middle cerebral artery territory affecting the parietal region, which was acute in May of this year.  This has progressed atrophy, encephalomalacia and gliosis.  There is restricted diffusion along the deep margins of this old infarction. I think this probably relates to T2 shine through rather than acute extension.  Elsewhere, the brain shows old small vessel infarctions within the deep white matter. There are old thalamic lacunar infarctions.  No evidence of  mass lesion, hemorrhage, hydrocephalus or extra-axial collection.  No mesial temporal lesion. No pituitary mass.  No inflammatory sinus disease.  No skull or skull base lesion.  Images described as postcontrast show a paucity of evidence of Gadolinium.  IMPRESSION: No identifiably acute finding.  Old right parietal cortical and subcortical infarction which has progressed to atrophy, encephalomalacia and gliosis.  Chronic small vessel change of the hemispheric white matter in the right cerebellum.   Original Report Authenticated By: Paulina Fusi, M.D.     Medications:  Scheduled Meds:   . aspirin  325 mg Oral Daily  . docusate sodium  100 mg Oral BID  . enalapril  5 mg Oral Daily  . hydrocortisone-pramoxine   Rectal BID  . levETIRAcetam  500 mg Oral BID  . pantoprazole  40 mg Oral Daily  . [COMPLETED] pneumococcal 23 valent vaccine  0.5 mL Intramuscular Tomorrow-1000  . simvastatin  20 mg Oral q1800  . sodium chloride  3 mL Intravenous Q12H   Continuous Infusions:  PRN Meds:.acetaminophen, HYDROcodone-acetaminophen, magnesium hydroxide, ondansetron (ZOFRAN) IV, ondansetron   Assessment/Plan: The patient is doing fairly well. There is no recurrent seizures. She has tolerated the Keppra. She is ambulating and there is no evidence of ataxia or dizziness. Extraocular movements are full and gait is fairly normal. She is continue with the Keppra and follow-up in the office in about 4 weeks. A sleep study to evaluate for sleep apnea is also being set up.   LOS:  2 days   Candis Kabel

## 2012-02-06 ENCOUNTER — Encounter: Payer: Self-pay | Admitting: Family Medicine

## 2012-02-06 ENCOUNTER — Ambulatory Visit (INDEPENDENT_AMBULATORY_CARE_PROVIDER_SITE_OTHER): Payer: Medicare Other | Admitting: Family Medicine

## 2012-02-06 ENCOUNTER — Telehealth: Payer: Self-pay | Admitting: Family Medicine

## 2012-02-06 VITALS — BP 166/94 | HR 67 | Resp 18

## 2012-02-06 DIAGNOSIS — R51 Headache: Secondary | ICD-10-CM

## 2012-02-06 DIAGNOSIS — I10 Essential (primary) hypertension: Secondary | ICD-10-CM | POA: Diagnosis not present

## 2012-02-06 DIAGNOSIS — R569 Unspecified convulsions: Secondary | ICD-10-CM

## 2012-02-06 MED ORDER — ENALAPRIL MALEATE 5 MG PO TABS: 5.0000 mg | ORAL_TABLET | Freq: Two times a day (BID) | ORAL | Status: DC

## 2012-02-06 NOTE — Telephone Encounter (Signed)
Patient in for a visit.

## 2012-02-06 NOTE — Telephone Encounter (Signed)
Please advise 

## 2012-02-06 NOTE — Patient Instructions (Addendum)
Get blood pressure cuff Increase enalapril to 1 tablet twice a day  F/U Monday for blood pressure

## 2012-02-06 NOTE — Telephone Encounter (Signed)
Please triage, she was discharged yesterday for seizure related to her old stroke No changes were made to her blood pressure medications See if she has taken medication already and have them recheck her BP

## 2012-02-07 DIAGNOSIS — B373 Candidiasis of vulva and vagina: Secondary | ICD-10-CM | POA: Diagnosis not present

## 2012-02-07 DIAGNOSIS — N841 Polyp of cervix uteri: Secondary | ICD-10-CM | POA: Diagnosis not present

## 2012-02-07 DIAGNOSIS — N898 Other specified noninflammatory disorders of vagina: Secondary | ICD-10-CM | POA: Diagnosis not present

## 2012-02-09 ENCOUNTER — Encounter: Payer: Self-pay | Admitting: Family Medicine

## 2012-02-09 NOTE — Assessment & Plan Note (Signed)
On keppra no seizure activity since discharge, has f/u with neurology

## 2012-02-09 NOTE — Assessment & Plan Note (Signed)
Now improved, tylenol as needed

## 2012-02-09 NOTE — Progress Notes (Signed)
  Subjective:    Patient ID: [name], female    DOB: [birth year], 66 y.o.   MRN: [id number]  HPI   Pt presents with elevated blood pressure. Discharged from hospital yesterday after found to have seizure activity related to her old CVA. Only new medication prescribed was Keppra. She was in surgeons office for f/u from hemorroid surgery and BP was elevated 190/ 90's. She has mild headache yesterday, today resolved. Pt and daughter was concerned as her BP was elevated along with headache a few days before her seizure. She has been taking enalapril 5mg  daily   Review of Systems   GEN- denies fatigue, fever, weight loss,weakness, recent illness HEENT- denies eye drainage, change in vision, nasal discharge, CVS- denies chest pain, palpitations RESP- denies SOB, cough, wheeze ABD- denies N/V, change in stools, abd pain Neuro- + headache, dizziness, syncope, seizure activity      Objective:   Physical Exam GEN- NAD, alert and oriented x3,  Repeat BP 186/80, 180/82 HEENT- PERRL, EOMI, non injected sclera, pink conjunctiva, MMM, oropharynx clear, fundoscopic exam benign CVS- RRR, no murmur RESP-CTAB EXT- No edema Pulses- Radial 2+ Neuro- CNII-XII in tact, normal tone, decreased strength on Left compared to right , no new focal deficits        Assessment & Plan:

## 2012-02-09 NOTE — Assessment & Plan Note (Signed)
Increase enalapril to 5 mg BID, she is to take BP at home, recheck on Monday, given red flags

## 2012-02-10 ENCOUNTER — Ambulatory Visit (INDEPENDENT_AMBULATORY_CARE_PROVIDER_SITE_OTHER): Payer: Medicare Other | Admitting: Family Medicine

## 2012-02-10 ENCOUNTER — Encounter: Payer: Self-pay | Admitting: Family Medicine

## 2012-02-10 ENCOUNTER — Encounter: Payer: Self-pay | Admitting: Gastroenterology

## 2012-02-10 VITALS — BP 150/74 | HR 74 | Resp 15 | Ht 62.5 in | Wt 147.0 lb

## 2012-02-10 DIAGNOSIS — I10 Essential (primary) hypertension: Secondary | ICD-10-CM

## 2012-02-10 NOTE — Patient Instructions (Signed)
Continue current medication Take 1 tablet in morning and 2nd tablet around 6pm - blood pressure medication Lab today  Keep previous f/u appointment

## 2012-02-11 ENCOUNTER — Encounter: Payer: Self-pay | Admitting: Family Medicine

## 2012-02-11 LAB — BASIC METABOLIC PANEL
Calcium: 9.7 mg/dL (ref 8.4–10.5)
Potassium: 4.3 mEq/L (ref 3.5–5.3)
Sodium: 139 mEq/L (ref 135–145)

## 2012-02-11 NOTE — Assessment & Plan Note (Signed)
BP improved today, continue meds, advised morning and dinner time for meds Metabolic panel rechecked today, WNL

## 2012-02-11 NOTE — Progress Notes (Signed)
  Subjective:    Patient ID: [name], female    DOB: 02-20-1946, 66 y.o.   MRN: [id number]  HPI  Patient here to followup blood pressure. She was seen last week after she began having headache and her blood pressure was very elevated. Her enalapril was increased to 5 mg twice a day. Her daughter has been given her enalapril at 8:00 AM as well as 2pm. She denies any headache today she feels much better.  Review of Systems - per above  GEN- denies fatigue, fever, weight loss,weakness, recent illness HEENT- denies eye drainage, change in vision, nasal discharge, CVS- denies chest pain, palpitations Neuro- denies headache, dizziness, syncope, seizure activity       Objective:   Physical Exam GEN- NAD, alert and oriented x3 CVS- RRR, no murmur RESP-CTAB Ext- no edema        Assessment & Plan:

## 2012-02-17 DIAGNOSIS — G4733 Obstructive sleep apnea (adult) (pediatric): Secondary | ICD-10-CM | POA: Diagnosis not present

## 2012-02-17 DIAGNOSIS — I10 Essential (primary) hypertension: Secondary | ICD-10-CM | POA: Diagnosis not present

## 2012-02-17 DIAGNOSIS — R569 Unspecified convulsions: Secondary | ICD-10-CM | POA: Diagnosis not present

## 2012-02-17 DIAGNOSIS — I6990 Unspecified sequelae of unspecified cerebrovascular disease: Secondary | ICD-10-CM | POA: Diagnosis not present

## 2012-05-07 ENCOUNTER — Ambulatory Visit: Payer: Medicare Other | Admitting: Family Medicine

## 2012-05-16 ENCOUNTER — Other Ambulatory Visit: Payer: Self-pay

## 2012-05-26 ENCOUNTER — Encounter: Payer: Self-pay | Admitting: Family Medicine

## 2012-06-26 ENCOUNTER — Other Ambulatory Visit (HOSPITAL_COMMUNITY)
Admission: RE | Admit: 2012-06-26 | Discharge: 2012-06-26 | Disposition: A | Discharge status: Home / Self Care | Payer: Medicare Other | Source: Ambulatory Visit | Attending: Family Medicine | Admitting: Family Medicine

## 2012-06-26 ENCOUNTER — Ambulatory Visit (INDEPENDENT_AMBULATORY_CARE_PROVIDER_SITE_OTHER): Payer: Medicare Other | Admitting: Family Medicine

## 2012-06-26 VITALS — BP 142/72 | HR 62 | Resp 18 | Ht 62.5 in | Wt 151.0 lb

## 2012-06-26 DIAGNOSIS — R569 Unspecified convulsions: Secondary | ICD-10-CM

## 2012-06-26 DIAGNOSIS — N760 Acute vaginitis: Secondary | ICD-10-CM | POA: Diagnosis not present

## 2012-06-26 DIAGNOSIS — K219 Gastro-esophageal reflux disease without esophagitis: Secondary | ICD-10-CM

## 2012-06-26 DIAGNOSIS — Z113 Encounter for screening for infections with a predominantly sexual mode of transmission: Secondary | ICD-10-CM | POA: Diagnosis not present

## 2012-06-26 DIAGNOSIS — E785 Hyperlipidemia, unspecified: Secondary | ICD-10-CM | POA: Diagnosis not present

## 2012-06-26 DIAGNOSIS — I10 Essential (primary) hypertension: Secondary | ICD-10-CM

## 2012-06-26 MED ORDER — METRONIDAZOLE 500 MG PO TABS: 500.0000 mg | ORAL_TABLET | Freq: Two times a day (BID) | ORAL | Status: AC

## 2012-06-26 MED ORDER — OMEPRAZOLE 20 MG PO CPDR: 20.0000 mg | DELAYED_RELEASE_CAPSULE | Freq: Two times a day (BID) | ORAL | Status: DC

## 2012-06-26 MED ORDER — FLUCONAZOLE 150 MG PO TABS: 150.0000 mg | ORAL_TABLET | Freq: Once | ORAL | Status: DC

## 2012-06-26 NOTE — Progress Notes (Signed)
  Subjective:    Patient ID: [name], female    DOB: 03-15-1946, 67 y.o.   MRN: [id number]  HPI  Vaginal discharge with fishy odor for past few days, has been using new soap and using shampoo and baking soda in her vaginal region. Feels very irritated on the outside, denies vaginal bleeding. Heartburn medication is wearing off by 6pm, does well in morning. Medications reviewed  Review of Systems GEN- denies fatigue, fever, weight loss,weakness, recent illness HEENT- denies eye drainage, change in vision, nasal discharge, CVS- denies chest pain, palpitations RESP- denies SOB, cough, wheeze ABD- denies N/V, change in stools, abd pain GU- denies dysuria, hematuria, dribbling, incontinence MSK- denies joint pain, muscle aches, injury Neuro- denies headache, dizziness, syncope, seizure activity        Objective:   Physical Exam  GEN- NAD, alert and oriented, Neck- supple, no thyromegaly Breast- normal symmetry, no nipple inversion,no nipple drainage, no nodules or lumps felt Nodes- no axillary nodes GU- normal external genitalia with mild erythema mons pubis and labia majora, vaginal mucosa pink and moist, cervix visualized no growth, no blood form os, +discharge, no CMT, no ovarian masses, uterus normal size       Assessment & Plan:

## 2012-06-26 NOTE — Patient Instructions (Signed)
Continue current medications Prilosec increased to twice a day Flagyl for BV and yeast pill Use a dab of hydrocortisone on irritated outer skin Labs fasting before next visit F/U 3 months

## 2012-06-28 ENCOUNTER — Encounter: Payer: Self-pay | Admitting: Family Medicine

## 2012-06-28 DIAGNOSIS — N760 Acute vaginitis: Secondary | ICD-10-CM | POA: Insufficient documentation

## 2012-06-28 NOTE — Assessment & Plan Note (Signed)
Will treat for BV, with flagyl also given diflucan, she is very symptomatic and our lab can not get results until next week. Cortisone 1% to outer skin for irritation Stop use of other skin products with exception of mild white bar soap

## 2012-06-28 NOTE — Assessment & Plan Note (Signed)
Well controlled 

## 2012-06-28 NOTE — Assessment & Plan Note (Signed)
Increase prilosec to BID dosing

## 2012-06-28 NOTE — Assessment & Plan Note (Signed)
Seizure free, doing well on keppra

## 2012-07-22 ENCOUNTER — Other Ambulatory Visit: Payer: Self-pay

## 2012-07-22 MED ORDER — OMEPRAZOLE 40 MG PO CPDR: 40.0000 mg | DELAYED_RELEASE_CAPSULE | Freq: Every day | ORAL | Status: DC

## 2012-07-28 ENCOUNTER — Encounter: Payer: Self-pay | Admitting: *Deleted

## 2012-08-05 DIAGNOSIS — R569 Unspecified convulsions: Secondary | ICD-10-CM | POA: Diagnosis not present

## 2012-08-05 DIAGNOSIS — I10 Essential (primary) hypertension: Secondary | ICD-10-CM | POA: Diagnosis not present

## 2012-08-05 DIAGNOSIS — G4733 Obstructive sleep apnea (adult) (pediatric): Secondary | ICD-10-CM | POA: Diagnosis not present

## 2012-08-05 DIAGNOSIS — Z79899 Other long term (current) drug therapy: Secondary | ICD-10-CM | POA: Diagnosis not present

## 2012-08-10 ENCOUNTER — Other Ambulatory Visit: Payer: Self-pay

## 2012-08-14 ENCOUNTER — Other Ambulatory Visit: Payer: Self-pay

## 2012-08-14 MED ORDER — ENALAPRIL MALEATE 5 MG PO TABS: 5.0000 mg | ORAL_TABLET | Freq: Two times a day (BID) | ORAL | Status: DC

## 2012-09-10 ENCOUNTER — Other Ambulatory Visit: Payer: Self-pay

## 2012-09-10 DIAGNOSIS — G4730 Sleep apnea, unspecified: Secondary | ICD-10-CM

## 2012-09-11 ENCOUNTER — Ambulatory Visit: Payer: Medicare Other | Attending: Neurology | Admitting: Sleep Medicine

## 2012-09-11 DIAGNOSIS — G4710 Hypersomnia, unspecified: Secondary | ICD-10-CM | POA: Insufficient documentation

## 2012-09-11 DIAGNOSIS — G4730 Sleep apnea, unspecified: Secondary | ICD-10-CM | POA: Insufficient documentation

## 2012-09-17 ENCOUNTER — Telehealth: Payer: Self-pay | Admitting: Family Medicine

## 2012-09-17 NOTE — Telephone Encounter (Signed)
?  ok to refill °

## 2012-09-18 MED ORDER — IBUPROFEN 800 MG PO TABS: 800.0000 mg | ORAL_TABLET | Freq: Three times a day (TID) | ORAL | Status: DC | PRN

## 2012-09-18 NOTE — Telephone Encounter (Signed)
Med refilled.

## 2012-09-18 NOTE — Telephone Encounter (Signed)
Okay to refill, with 3 refills

## 2012-09-25 ENCOUNTER — Ambulatory Visit (INDEPENDENT_AMBULATORY_CARE_PROVIDER_SITE_OTHER): Payer: Medicare Other | Admitting: Family Medicine

## 2012-09-25 ENCOUNTER — Ambulatory Visit: Payer: Medicare Other | Admitting: Family Medicine

## 2012-09-25 VITALS — BP 120/60 | HR 78 | Temp 97.0°F | Resp 18 | Wt 152.0 lb

## 2012-09-25 DIAGNOSIS — I6350 Cerebral infarction due to unspecified occlusion or stenosis of unspecified cerebral artery: Secondary | ICD-10-CM

## 2012-09-25 DIAGNOSIS — F0150 Vascular dementia without behavioral disturbance: Secondary | ICD-10-CM | POA: Diagnosis not present

## 2012-09-25 DIAGNOSIS — K219 Gastro-esophageal reflux disease without esophagitis: Secondary | ICD-10-CM | POA: Diagnosis not present

## 2012-09-25 DIAGNOSIS — I10 Essential (primary) hypertension: Secondary | ICD-10-CM

## 2012-09-25 DIAGNOSIS — I639 Cerebral infarction, unspecified: Secondary | ICD-10-CM

## 2012-09-25 DIAGNOSIS — Z1231 Encounter for screening mammogram for malignant neoplasm of breast: Secondary | ICD-10-CM

## 2012-09-25 DIAGNOSIS — Z01419 Encounter for gynecological examination (general) (routine) without abnormal findings: Secondary | ICD-10-CM

## 2012-09-25 MED ORDER — ESOMEPRAZOLE MAGNESIUM 40 MG PO CPDR: 40.0000 mg | DELAYED_RELEASE_CAPSULE | Freq: Every day | ORAL | Status: DC

## 2012-09-25 MED ORDER — SIMVASTATIN 20 MG PO TABS: 20.0000 mg | ORAL_TABLET | Freq: Every day | ORAL | Status: DC

## 2012-09-25 MED ORDER — ENALAPRIL MALEATE 5 MG PO TABS: 5.0000 mg | ORAL_TABLET | Freq: Two times a day (BID) | ORAL | Status: DC

## 2012-09-25 MED ORDER — LEVETIRACETAM 500 MG PO TABS: 500.0000 mg | ORAL_TABLET | Freq: Two times a day (BID) | ORAL | Status: DC

## 2012-09-25 NOTE — Procedures (Signed)
[facility] [name] A. Gerilyn Pilgrim, MD     [email or web address]        [name], [name]                  ACCOUNT NO.:  [id number]  MEDICAL RECORD NO.:  [id number]          PATIENT TYPE:  OUT  LOCATION:  SLEEP LAB                     FACILITY:  [facility]  PHYSICIAN:  [name], M.D. DATE OF BIRTH:  [birth year]  DATE OF STUDY:  09/11/2012                           NOCTURNAL POLYSOMNOGRAM  REFERRING PHYSICIAN:  Molley Houser A. Gerilyn [name], M.D.  INDICATIONS:  A 67 year old, who presents with hypersomnia, snoring, and fatigue.   MEDICATIONS:  Aspirin, Zocor, enalapril, Prilosec, Keppra.  EPWORTH SLEEPINESS SCALE:  17.  BMI:  30.  ARCHITECTURAL SUMMARY:  The total recording time is 426 minutes.  Sleep efficiency 69%, sleep latency 10 minutes, REM latency 99 minutes.  Stage N1 12%, N2 74%, N3 0%, and REM sleep 14%.  RESPIRATORY SUMMARY:  Baseline oxygen saturation is 98, lowest saturation 92 during REM sleep.  Diagnostic AHI is 2 and RDI 5.  LIMB MOVEMENT SUMMARY:  PLM index 0.  ELECTROCARDIOGRAM SUMMARY:  Average heart rate is 61, with no significant dysrhythmias observed.  IMPRESSION:  Unremarkable nocturnal polysomnography.    Saraiyah Hemminger A. Gerilyn [name], M.D.    KAD/MEDQ  D:  09/25/2012 08:50:06  T:  09/25/2012 09:17:22  Job:  [id number]

## 2012-09-25 NOTE — Patient Instructions (Addendum)
Continue current medications Nexium sent Referral for GYN exam Get the labs done fasting  Mammogram to be done in July  F/U 3 months

## 2012-09-25 NOTE — Progress Notes (Signed)
  Subjective:    Patient ID: Nimrit Kehres, female    DOB: 03-28-1946, 67 y.o.   MRN: [id number]  HPI  Pt here to f/u chronic medical problems. Has had difficulty with acid reflux, using nexium as she had samples. This has worked better than prilosec or the prevacid. Sleep study done by neurology was unremarkable, no recent seizure activity. Due for fasting labs Daughter inquired about PCS services as she cares for her due to vascular dementia and CVA  Review of Systems    GEN- denies fatigue, fever, weight loss,weakness, recent illness HEENT- denies eye drainage, change in vision, nasal discharge, CVS- denies chest pain, palpitations RESP- denies SOB, cough, wheeze ABD- denies N/V, change in stools, abd pain GU- denies dysuria, hematuria, dribbling, incontinence MSK- denies joint pain, muscle aches, injury Neuro- denies headache, dizziness, syncope, seizure activity       Objective:   Physical Exam GEN- NAD, alert and oriented x3 HEENT- PERRL, EOMI, non injected sclera, pink conjunctiva, MMM, oropharynx clear Neck- Supple, no bruit CVS- RRR, no murmur RESP-CTAB ABD-NABS,soft,NT,ND EXT- No edema Pulses- Radial, DP- 2+        Assessment & Plan:

## 2012-09-27 ENCOUNTER — Encounter: Payer: Self-pay | Admitting: Family Medicine

## 2012-09-27 NOTE — Assessment & Plan Note (Signed)
Currently doing well no behavioral issues, daughter cares for, takes to appt, fixes medication boxes, helps prepare meals Will look into PCS services

## 2012-09-27 NOTE — Assessment & Plan Note (Signed)
Well controlled 

## 2012-09-27 NOTE — Assessment & Plan Note (Signed)
Trial of nexium, has tried prilosec, prevacid, zantac, tums

## 2012-09-27 NOTE — Assessment & Plan Note (Signed)
Doing well, on statin therapy and ASA Recheck Fasting labs

## 2012-09-30 ENCOUNTER — Telehealth: Payer: Self-pay | Admitting: Family Medicine

## 2012-09-30 NOTE — Telephone Encounter (Signed)
I called pt and daughter, they are requesting PCS services They need to contact the agency that she wants services from and have them fax [place] the proper form, we do not have these   We need the PCS form for pt with medicare/medicaid

## 2012-10-01 NOTE — Telephone Encounter (Signed)
Talk to Rob about PCS form Monday

## 2012-10-05 NOTE — Telephone Encounter (Signed)
PCS form in basket

## 2012-10-12 ENCOUNTER — Encounter: Payer: Self-pay | Admitting: Family Medicine

## 2012-10-22 ENCOUNTER — Ambulatory Visit: Payer: Medicare Other | Admitting: Obstetrics and Gynecology

## 2012-10-26 ENCOUNTER — Ambulatory Visit (HOSPITAL_COMMUNITY)
Admission: RE | Admit: 2012-10-26 | Discharge: 2012-10-26 | Disposition: A | Discharge status: Home / Self Care | Payer: Medicare Other | Source: Ambulatory Visit | Attending: Family Medicine | Admitting: Family Medicine

## 2012-10-26 DIAGNOSIS — Z1231 Encounter for screening mammogram for malignant neoplasm of breast: Secondary | ICD-10-CM | POA: Insufficient documentation

## 2012-11-11 ENCOUNTER — Ambulatory Visit (INDEPENDENT_AMBULATORY_CARE_PROVIDER_SITE_OTHER): Payer: Medicare Other | Admitting: Obstetrics and Gynecology

## 2012-11-11 ENCOUNTER — Encounter: Payer: Self-pay | Admitting: Obstetrics and Gynecology

## 2012-11-11 ENCOUNTER — Other Ambulatory Visit (HOSPITAL_COMMUNITY)
Admission: RE | Admit: 2012-11-11 | Discharge: 2012-11-11 | Disposition: A | Discharge status: Home / Self Care | Payer: Medicare Other | Source: Ambulatory Visit | Attending: Obstetrics and Gynecology | Admitting: Obstetrics and Gynecology

## 2012-11-11 VITALS — BP 122/88 | Ht 60.0 in | Wt 151.8 lb

## 2012-11-11 DIAGNOSIS — Z124 Encounter for screening for malignant neoplasm of cervix: Secondary | ICD-10-CM | POA: Diagnosis not present

## 2012-11-11 DIAGNOSIS — Z0142 Encounter for cervical smear to confirm findings of recent normal smear following initial abnormal smear: Secondary | ICD-10-CM

## 2012-11-11 DIAGNOSIS — R87619 Unspecified abnormal cytological findings in specimens from cervix uteri: Secondary | ICD-10-CM | POA: Insufficient documentation

## 2012-11-11 NOTE — Progress Notes (Signed)
Patient ID: [name], female   DOB: [birth year], 67 y.o.   MRN: [id number] Pt here today to follow up from biopsy. Pt to have a pap. Pt denies any problems or issues at this time. Pt is s/p removal of polyp during eval for AGUS pap, 12/2011 With eval including negative cervical biopsies, negative endometrial biopsy. Here today for first pap after removal of polyp.  Physical Examination: General appearance - alert, well appearing, and in no distress Abdomen - soft, nontender, nondistended, no masses or organomegaly Pelvic - normal external genitalia, vulva, vagina, cervix, uterus and adnexa Atrophic,cervix.  A: first of 2 paps q 6 mos s/p AGUS pap with removal of cervical polyp 10/13

## 2012-12-23 ENCOUNTER — Telehealth: Payer: Self-pay | Admitting: Family Medicine

## 2012-12-23 NOTE — Telephone Encounter (Signed)
I received a letter from patient's daughter [name]. In the letter she describes that her mother was treated for a psychotic breakdown and was institutionalized some years ago. She has noticed that she is now on the spleen the symptoms again were she does not trust anyone and states that she hears voices that are not present. She does  not want her mother institutionalized and does not want to give her medications but wanted me to speak to her mother directly. She states that her mother does not trust her now and thinks that she is feeling her money. I called [name] left a message on her cell phone per the instructions and then noted to have her call the office for an appointment. I think this needs to be discussed in person   Please call and make sure pt has appt - I need a 30 minute slot- adjust if needed

## 2012-12-25 NOTE — Telephone Encounter (Signed)
LMTRC with pt.

## 2012-12-25 NOTE — Telephone Encounter (Signed)
appt moved to 11:15 on Monday for 30 minutes

## 2012-12-28 ENCOUNTER — Ambulatory Visit (INDEPENDENT_AMBULATORY_CARE_PROVIDER_SITE_OTHER): Payer: Medicare Other | Admitting: Family Medicine

## 2012-12-28 VITALS — BP 120/80 | HR 70 | Temp 97.8°F | Resp 18 | Wt 150.0 lb

## 2012-12-28 DIAGNOSIS — E785 Hyperlipidemia, unspecified: Secondary | ICD-10-CM | POA: Diagnosis not present

## 2012-12-28 DIAGNOSIS — F0150 Vascular dementia without behavioral disturbance: Secondary | ICD-10-CM

## 2012-12-28 DIAGNOSIS — I10 Essential (primary) hypertension: Secondary | ICD-10-CM

## 2012-12-28 DIAGNOSIS — F919 Conduct disorder, unspecified: Secondary | ICD-10-CM

## 2012-12-28 DIAGNOSIS — R4189 Other symptoms and signs involving cognitive functions and awareness: Secondary | ICD-10-CM

## 2012-12-28 DIAGNOSIS — Z23 Encounter for immunization: Secondary | ICD-10-CM

## 2012-12-28 LAB — COMPREHENSIVE METABOLIC PANEL
AST: 16 U/L (ref 0–37)
Albumin: 4.6 g/dL (ref 3.5–5.2)
Alkaline Phosphatase: 80 U/L (ref 39–117)
BUN: 13 mg/dL (ref 6–23)
Calcium: 10 mg/dL (ref 8.4–10.5)
Chloride: 103 mEq/L (ref 96–112)
Creat: 0.91 mg/dL (ref 0.50–1.10)
Glucose, Bld: 102 mg/dL — ABNORMAL HIGH (ref 70–99)

## 2012-12-28 LAB — CBC
HCT: 36.5 % (ref 36.0–46.0)
Hemoglobin: 12.5 g/dL (ref 12.0–15.0)
MCHC: 34.2 g/dL (ref 30.0–36.0)
RBC: 4.42 MIL/uL (ref 3.87–5.11)

## 2012-12-28 LAB — LIPID PANEL
Cholesterol: 178 mg/dL (ref 0–200)
Total CHOL/HDL Ratio: 2.7 Ratio
Triglycerides: 193 mg/dL — ABNORMAL HIGH (ref <150)
VLDL: 39 mg/dL (ref 0–40)

## 2012-12-28 MED ORDER — DONEPEZIL HCL 5 MG PO TABS: 5.0000 mg | ORAL_TABLET | Freq: Every day | ORAL | Status: DC

## 2012-12-28 NOTE — Patient Instructions (Signed)
We will call with results Flu shot given  Continue current medications F/U 4 months

## 2012-12-29 ENCOUNTER — Encounter: Payer: Self-pay | Admitting: Family Medicine

## 2012-12-29 DIAGNOSIS — E785 Hyperlipidemia, unspecified: Secondary | ICD-10-CM | POA: Insufficient documentation

## 2012-12-29 DIAGNOSIS — R4689 Other symptoms and signs involving appearance and behavior: Secondary | ICD-10-CM | POA: Insufficient documentation

## 2012-12-29 DIAGNOSIS — R4189 Other symptoms and signs involving cognitive functions and awareness: Secondary | ICD-10-CM | POA: Insufficient documentation

## 2012-12-29 NOTE — Progress Notes (Signed)
  Subjective:    Patient ID: [name], female    DOB: [birth year], 67 y.o.   MRN: [id number]  HPI  Pt here to f/u chronic medical problems and medications. Due for fasting labs. Here with daughter who wrote me a letter about mothers behaviors. See chart for details. Mother has been acting out, yelling and cursing thinking daughter is stealing her money and not paying pills. [name] agrees she has cursed at her daughter about Monday, thinks she is not handeling money the right way, states her grandson also brought this to her attention. Daughter states she talks about people under the house and in the streets yelling when no one is there. She does not wander, is not physically abusive. In the past she was placed in inpatient psychiatric facility but when discharged had no meds or diagnosis??. [name] agrees her short term memory is poor but can remember things in the past, and she does not know why her daughter "locked her up".   Review of Systems  GEN- denies fatigue, fever, weight loss,weakness, recent illness HEENT- denies eye drainage, change in vision, nasal discharge, CVS- denies chest pain, palpitations RESP- denies SOB, cough, wheeze ABD- denies N/V, change in stools, abd pain GU- denies dysuria, hematuria, dribbling, incontinence MSK- denies joint pain, muscle aches, injury Neuro- denies headache, dizziness, syncope, seizure activity      Objective:   Physical Exam  GEN- NAD, alert and oriented x3 HEENT- PERRL, EOMI, non injected sclera, pink conjunctiva, MMM, oropharynx clear Neck- Supple, no bruit CVS- RRR, no murmur RESP-CTAB EXT- No edema Pulses- Radial, DP- 2+ Psych- normal affect and mood, speech normal, good eye contact, no hallucinations,     Assessment & Plan:

## 2012-12-29 NOTE — Assessment & Plan Note (Signed)
Per above, trial of aricpet Declines any therapy or other meds

## 2012-12-29 NOTE — Assessment & Plan Note (Signed)
Well controlled 

## 2012-12-29 NOTE — Assessment & Plan Note (Signed)
Now with some behavioral changes, though difficult as pt very lucid Discussed aricept, she is reluctant but hopefully will try

## 2012-12-29 NOTE — Assessment & Plan Note (Signed)
Check FLP on statin drug LFT

## 2013-01-01 ENCOUNTER — Telehealth: Payer: Self-pay | Admitting: *Deleted

## 2013-01-01 NOTE — Telephone Encounter (Signed)
Pt daughter aware of results  

## 2013-01-29 ENCOUNTER — Other Ambulatory Visit: Payer: Self-pay | Admitting: Family Medicine

## 2013-01-29 NOTE — Telephone Encounter (Signed)
Medication refilled per protocol. 

## 2013-02-23 ENCOUNTER — Other Ambulatory Visit: Payer: Self-pay | Admitting: Family Medicine

## 2013-02-23 NOTE — Telephone Encounter (Signed)
Medication refilled per protocol. 

## 2013-03-05 ENCOUNTER — Telehealth: Payer: Self-pay | Admitting: Family Medicine

## 2013-03-05 NOTE — Telephone Encounter (Signed)
req refill Omeprazole but pt now on nexium

## 2013-04-03 ENCOUNTER — Other Ambulatory Visit: Payer: Self-pay | Admitting: Family Medicine

## 2013-04-07 ENCOUNTER — Emergency Department (HOSPITAL_COMMUNITY): Payer: Medicare Other

## 2013-04-07 ENCOUNTER — Inpatient Hospital Stay (HOSPITAL_COMMUNITY)
Admission: EM | Admit: 2013-04-07 | Discharge: 2013-04-08 | DRG: 101 | Disposition: A | Discharge status: Home / Self Care | Payer: Medicare Other | Attending: Internal Medicine | Admitting: Internal Medicine

## 2013-04-07 ENCOUNTER — Inpatient Hospital Stay (HOSPITAL_COMMUNITY): Payer: Medicare Other

## 2013-04-07 ENCOUNTER — Encounter (HOSPITAL_COMMUNITY): Payer: Self-pay | Admitting: Internal Medicine

## 2013-04-07 ENCOUNTER — Other Ambulatory Visit: Payer: Self-pay | Admitting: *Deleted

## 2013-04-07 DIAGNOSIS — I6350 Cerebral infarction due to unspecified occlusion or stenosis of unspecified cerebral artery: Secondary | ICD-10-CM | POA: Diagnosis not present

## 2013-04-07 DIAGNOSIS — I672 Cerebral atherosclerosis: Secondary | ICD-10-CM | POA: Diagnosis present

## 2013-04-07 DIAGNOSIS — I639 Cerebral infarction, unspecified: Secondary | ICD-10-CM

## 2013-04-07 DIAGNOSIS — R42 Dizziness and giddiness: Secondary | ICD-10-CM | POA: Diagnosis present

## 2013-04-07 DIAGNOSIS — R51 Headache: Secondary | ICD-10-CM

## 2013-04-07 DIAGNOSIS — G9389 Other specified disorders of brain: Secondary | ICD-10-CM | POA: Diagnosis not present

## 2013-04-07 DIAGNOSIS — R200 Anesthesia of skin: Secondary | ICD-10-CM | POA: Diagnosis present

## 2013-04-07 DIAGNOSIS — F0150 Vascular dementia without behavioral disturbance: Secondary | ICD-10-CM | POA: Diagnosis not present

## 2013-04-07 DIAGNOSIS — Z8673 Personal history of transient ischemic attack (TIA), and cerebral infarction without residual deficits: Secondary | ICD-10-CM | POA: Diagnosis not present

## 2013-04-07 DIAGNOSIS — R569 Unspecified convulsions: Secondary | ICD-10-CM | POA: Diagnosis present

## 2013-04-07 DIAGNOSIS — E785 Hyperlipidemia, unspecified: Secondary | ICD-10-CM | POA: Diagnosis present

## 2013-04-07 DIAGNOSIS — Z91199 Patient's noncompliance with other medical treatment and regimen due to unspecified reason: Secondary | ICD-10-CM

## 2013-04-07 DIAGNOSIS — R918 Other nonspecific abnormal finding of lung field: Secondary | ICD-10-CM | POA: Diagnosis not present

## 2013-04-07 DIAGNOSIS — I10 Essential (primary) hypertension: Secondary | ICD-10-CM | POA: Diagnosis present

## 2013-04-07 DIAGNOSIS — G8194 Hemiplegia, unspecified affecting left nondominant side: Secondary | ICD-10-CM | POA: Diagnosis present

## 2013-04-07 DIAGNOSIS — Z9119 Patient's noncompliance with other medical treatment and regimen: Secondary | ICD-10-CM | POA: Diagnosis not present

## 2013-04-07 DIAGNOSIS — Z79899 Other long term (current) drug therapy: Secondary | ICD-10-CM

## 2013-04-07 DIAGNOSIS — R519 Headache, unspecified: Secondary | ICD-10-CM | POA: Diagnosis present

## 2013-04-07 DIAGNOSIS — Z7982 Long term (current) use of aspirin: Secondary | ICD-10-CM

## 2013-04-07 DIAGNOSIS — Z87891 Personal history of nicotine dependence: Secondary | ICD-10-CM | POA: Diagnosis not present

## 2013-04-07 DIAGNOSIS — R202 Paresthesia of skin: Secondary | ICD-10-CM | POA: Diagnosis present

## 2013-04-07 DIAGNOSIS — G40802 Other epilepsy, not intractable, without status epilepticus: Secondary | ICD-10-CM | POA: Diagnosis not present

## 2013-04-07 DIAGNOSIS — R29898 Other symptoms and signs involving the musculoskeletal system: Secondary | ICD-10-CM | POA: Diagnosis not present

## 2013-04-07 DIAGNOSIS — K219 Gastro-esophageal reflux disease without esophagitis: Secondary | ICD-10-CM | POA: Diagnosis present

## 2013-04-07 DIAGNOSIS — G40909 Epilepsy, unspecified, not intractable, without status epilepticus: Secondary | ICD-10-CM | POA: Diagnosis not present

## 2013-04-07 DIAGNOSIS — Z720 Tobacco use: Secondary | ICD-10-CM

## 2013-04-07 LAB — POCT I-STAT, CHEM 8
BUN: 18 mg/dL (ref 6–23)
CHLORIDE: 105 meq/L (ref 96–112)
CREATININE: 1 mg/dL (ref 0.50–1.10)
Calcium, Ion: 1.17 mmol/L (ref 1.13–1.30)
Glucose, Bld: 166 mg/dL — ABNORMAL HIGH (ref 70–99)
HCT: 42 % (ref 36.0–46.0)
Hemoglobin: 14.3 g/dL (ref 12.0–15.0)
Potassium: 3.9 mEq/L (ref 3.7–5.3)
Sodium: 140 mEq/L (ref 137–147)
TCO2: 25 mmol/L (ref 0–100)

## 2013-04-07 LAB — COMPREHENSIVE METABOLIC PANEL
ALT: 13 U/L (ref 0–35)
AST: 17 U/L (ref 0–37)
Albumin: 3.8 g/dL (ref 3.5–5.2)
Alkaline Phosphatase: 75 U/L (ref 39–117)
BUN: 15 mg/dL (ref 6–23)
CALCIUM: 9.4 mg/dL (ref 8.4–10.5)
CO2: 26 meq/L (ref 19–32)
CREATININE: 0.82 mg/dL (ref 0.50–1.10)
Chloride: 103 mEq/L (ref 96–112)
GFR, EST AFRICAN AMERICAN: 83 mL/min — AB (ref >90)
GFR, EST NON AFRICAN AMERICAN: 71 mL/min — AB (ref >90)
GLUCOSE: 163 mg/dL — AB (ref 70–99)
Potassium: 4.2 mEq/L (ref 3.7–5.3)
Sodium: 143 mEq/L (ref 137–147)
Total Bilirubin: <0.2 mg/dL — ABNORMAL LOW (ref 0.3–1.2)
Total Protein: 7.2 g/dL (ref 6.0–8.3)

## 2013-04-07 LAB — HEMOGLOBIN A1C
Hgb A1c MFr Bld: 6 % — ABNORMAL HIGH (ref <5.7)
MEAN PLASMA GLUCOSE: 126 mg/dL — AB (ref <117)

## 2013-04-07 LAB — CBC WITH DIFFERENTIAL/PLATELET
Basophils Absolute: 0 10*3/uL (ref 0.0–0.1)
Basophils Relative: 0 % (ref 0–1)
EOS ABS: 0 10*3/uL (ref 0.0–0.7)
EOS PCT: 1 % (ref 0–5)
HEMATOCRIT: 36 % (ref 36.0–46.0)
Hemoglobin: 12.2 g/dL (ref 12.0–15.0)
LYMPHS ABS: 1.8 10*3/uL (ref 0.7–4.0)
Lymphocytes Relative: 35 % (ref 12–46)
MCH: 28.9 pg (ref 26.0–34.0)
MCHC: 33.9 g/dL (ref 30.0–36.0)
MCV: 85.3 fL (ref 78.0–100.0)
MONO ABS: 0.3 10*3/uL (ref 0.1–1.0)
MONOS PCT: 6 % (ref 3–12)
Neutro Abs: 3 10*3/uL (ref 1.7–7.7)
Neutrophils Relative %: 58 % (ref 43–77)
PLATELETS: 244 10*3/uL (ref 150–400)
RBC: 4.22 MIL/uL (ref 3.87–5.11)
RDW: 14 % (ref 11.5–15.5)
WBC: 5.1 10*3/uL (ref 4.0–10.5)

## 2013-04-07 LAB — URINALYSIS, ROUTINE W REFLEX MICROSCOPIC
BILIRUBIN URINE: NEGATIVE
Glucose, UA: NEGATIVE mg/dL
KETONES UR: NEGATIVE mg/dL
Leukocytes, UA: NEGATIVE
Nitrite: NEGATIVE
PH: 5.5 (ref 5.0–8.0)
PROTEIN: NEGATIVE mg/dL
Specific Gravity, Urine: 1.011 (ref 1.005–1.030)
Urobilinogen, UA: 0.2 mg/dL (ref 0.0–1.0)

## 2013-04-07 LAB — PHOSPHORUS: Phosphorus: 3.7 mg/dL (ref 2.3–4.6)

## 2013-04-07 LAB — POCT I-STAT TROPONIN I: TROPONIN I, POC: 0.01 ng/mL (ref 0.00–0.08)

## 2013-04-07 LAB — TROPONIN I
Troponin I: <0.3 ng/mL (ref <0.30)
Troponin I: <0.3 ng/mL (ref <0.30)

## 2013-04-07 LAB — GLUCOSE, CAPILLARY: Glucose-Capillary: 147 mg/dL — ABNORMAL HIGH (ref 70–99)

## 2013-04-07 LAB — URINE MICROSCOPIC-ADD ON

## 2013-04-07 LAB — MAGNESIUM: Magnesium: 1.9 mg/dL (ref 1.5–2.5)

## 2013-04-07 MED ORDER — SODIUM CHLORIDE 0.9 % IV SOLN
750.0000 mg | Freq: Two times a day (BID) | INTRAVENOUS | Status: DC
  Administered 2013-04-07 – 2013-04-08 (×2): 750 mg via INTRAVENOUS
  Filled 2013-04-07 (×5): qty 7.5

## 2013-04-07 MED ORDER — SODIUM CHLORIDE 0.9 % IJ SOLN
3.0000 mL | Freq: Two times a day (BID) | INTRAMUSCULAR | Status: DC
  Administered 2013-04-07 – 2013-04-08 (×3): 3 mL via INTRAVENOUS

## 2013-04-07 MED ORDER — PANTOPRAZOLE SODIUM 40 MG PO TBEC: 40.0000 mg | DELAYED_RELEASE_TABLET | Freq: Every day | ORAL | Status: DC

## 2013-04-07 MED ORDER — SODIUM CHLORIDE 0.9 % IV SOLN: 250.0000 mL | INTRAVENOUS | Status: DC | PRN

## 2013-04-07 MED ORDER — PANTOPRAZOLE SODIUM 40 MG PO TBEC
40.0000 mg | DELAYED_RELEASE_TABLET | Freq: Every day | ORAL | Status: DC
  Administered 2013-04-07 – 2013-04-08 (×2): 40 mg via ORAL
  Filled 2013-04-07 (×2): qty 1

## 2013-04-07 MED ORDER — SODIUM CHLORIDE 0.9 % IJ SOLN: 3.0000 mL | INTRAMUSCULAR | Status: DC | PRN

## 2013-04-07 MED ORDER — LORAZEPAM 2 MG/ML IJ SOLN: 1.0000 mg | INTRAMUSCULAR | Status: DC | PRN

## 2013-04-07 MED ORDER — ENOXAPARIN SODIUM 40 MG/0.4ML ~~LOC~~ SOLN
40.0000 mg | SUBCUTANEOUS | Status: DC
  Filled 2013-04-07 (×3): qty 0.4

## 2013-04-07 MED ORDER — ASPIRIN EC 325 MG PO TBEC
325.0000 mg | DELAYED_RELEASE_TABLET | Freq: Every day | ORAL | Status: DC
  Administered 2013-04-07 – 2013-04-08 (×2): 325 mg via ORAL
  Filled 2013-04-07 (×2): qty 1

## 2013-04-07 MED ORDER — LORAZEPAM 2 MG/ML IJ SOLN
1.0000 mg | Freq: Once | INTRAMUSCULAR | Status: AC
  Administered 2013-04-07: 1 mg via INTRAVENOUS
  Filled 2013-04-07: qty 1

## 2013-04-07 MED ORDER — SIMVASTATIN 20 MG PO TABS
20.0000 mg | ORAL_TABLET | Freq: Every day | ORAL | Status: DC
  Filled 2013-04-07: qty 1

## 2013-04-07 MED ORDER — ENALAPRIL MALEATE 5 MG PO TABS
5.0000 mg | ORAL_TABLET | Freq: Two times a day (BID) | ORAL | Status: DC
  Administered 2013-04-07 – 2013-04-08 (×2): 5 mg via ORAL
  Filled 2013-04-07 (×4): qty 1

## 2013-04-07 MED ORDER — ACETAMINOPHEN 500 MG PO TABS: 1000.0000 mg | ORAL_TABLET | Freq: Four times a day (QID) | ORAL | Status: DC | PRN

## 2013-04-07 NOTE — Code Documentation (Signed)
68yo female arriving to [facility] via [place].  EMS reports left sided weakness and hypertension.  Patient with a history of stroke, hypertension and seizures on Keppra.  Patient reports that she had an episode much like her previous seizures and fell. She called for help and her neighbor responded and notified EMS.  She reports a tingling sensation on the left side.  Patient taken to CT where she was noticed to have focal seizures in her left leg.  NIHSS 4 on arrival, see documentation for details.  Patient given Ativan 1mg  IVP by ED RN per MD for seizures.  Her tingling improved per patient after Ativan was administered.  No acute stroke treatment at this time per Dr. Nicole Kindred.  Code stroke canceled at 1222.  Bedside handoff with ED RN [place].

## 2013-04-07 NOTE — ED Notes (Signed)
Code stroke cancelled 

## 2013-04-07 NOTE — ED Notes (Signed)
Called pharmacy x 2 about Keppra

## 2013-04-07 NOTE — H&P (Signed)
Date: 04/07/2013               Patient Name:  [name] MRN: [id number]  DOB: [birth year] Age / Sex: 68 y.o., female   PCP: Shannon Rossetti, MD         Medical Service: Internal Medicine Teaching Service         Attending Physician: Dr. Dominic Pea, DO    First Contact: Dr. Lesly Dukes Pager: [phone]  Second Contact: Dr. Karlyn Agee Pager: [phone]       After Hours (After 5p/  First Contact Pager: [phone]  weekends / holidays): Second Contact Pager: [phone]   Chief Complaint: Seizure  History of Present Illness:  [name] is a 68 y.o. female with PMH right cerebral infarction (May 2013), seizure disorder on Keppra, hypertension, hyperlipidemia and dementia, who presents with focal motor seizure activity as well as weakness involving left lower extremity. History is provided by the patient and her daughter at the bedside, [name].  Patient reports she had a seizure at home this morning. She thinks it lasted 10-15 minutes, but this may be unreliable as seizure was un-witnessed. She fell to the floor and says she not hit her head. She denies losing consciousness, but also says she remember much about the event. After the stroke she was weak and "tingly" in her bilateral lower extremities. She could not walk, so she began screaming until her neighbors found her. They called EMS. Since that time her symptoms have steadily resolved. She had no loss of bowel or bladder control, denies tongue biting, but does endorse post-ictal confusion which has resolved.   Daughter says patient was complaining of chest pain the night before the event. She does endorse some chest pain with the seizure and neighbors reportedly noted her clutching her chest when they found her. Denies current chest pain, shortness of breath, nausea, vomiting, dysuria, urinary frequency, weakness, numbness.  She says she has had a seizure disorder since her stroke in May 2013. She follows with a neurologist in [place],  but she cannot recall his name. She is supposed to take Keppra 500 mg twice a day, but had missed two doses prior to this morning's event. Her daughter says her last seizure was about 6 months ago, again after she ran out of her medicine. She had a similar transient episode of left-sided weakness at that time. She has some very mild baseline left-sided weakness residual from her stroke. Her PCP is Dr. Buelah Manis with [facility].  When she arrived in the emergency room, she was labelled a code stroke due to her complaint of weakness involving her legs. She was noted to have focal motor seizure activity involving left lower extremity intermittently. She was given 1 mg of Ativan, which stopped focal seizure activity. She was subsequently given a loading dose of Keppra 1000 mg IV. Admission to IMTS was recommended for seizure management as well as to rule out recurrent CVA.   Meds: Current Facility-Administered Medications  Medication Dose Route Frequency Provider Last Rate Last Dose  . aspirin EC tablet 325 mg  325 mg Oral Daily Cresenciano Genre, MD      . levETIRAcetam (KEPPRA) 750 mg in sodium chloride 0.9 % 100 mL IVPB  750 mg Intravenous Q12H Cresenciano Genre, MD       Current Outpatient Prescriptions  Medication Sig Dispense Refill  . acetaminophen (TYLENOL) 500 MG tablet Take 1,000 mg by mouth every 6 (six) hours as needed. Pain.      Marland Kitchen  donepezil (ARICEPT) 5 MG tablet Take 1 tablet (5 mg total) by mouth at bedtime.  30 tablet  3  . enalapril (VASOTEC) 5 MG tablet TAKE ONE TABLET BY MOUTH TWICE DAILY  180 tablet  0  . ENALAPRIL MALEATE PO Take 1 tablet by mouth 2 (two) times daily.      Marland Kitchen ibuprofen (ADVIL,MOTRIN) 800 MG tablet Take 1 tablet (800 mg total) by mouth every 8 (eight) hours as needed for pain.  60 tablet  3  . levETIRAcetam (KEPPRA) 500 MG tablet TAKE ONE TABLET BY MOUTH TWICE DAILY  180 tablet  0  . NEXIUM 40 MG capsule TAKE ONE CAPSULE BY MOUTH ONCE DAILY  30 capsule  5  .  simvastatin (ZOCOR) 20 MG tablet Take 1 tablet (20 mg total) by mouth daily at 6 PM.  90 tablet  1    Allergies: Allergies as of 04/07/2013  . (No Known Allergies)   Past Medical History  Diagnosis Date  . Hypertension   . GERD (gastroesophageal reflux disease)   . Stroke May 2013  . Hyperlipidemia   . Dementia, vascular   . Seizure disorder    Past Surgical History  Procedure Laterality Date  . Bunionectomy    . Colonoscopy  10/31/2011     LARGE Internal hemorrhoids, S/P BANDING/ ADENOMATOUS APPEARING Polyp in the ascending colon/ multiple polyps in the  descending, sigmoid   colon and rectumSIMPLE ADENOMAS, SURVEILLANCE 2023  . Esophagogastroduodenoscopy  10/31/2011    LARGE Hiatal hernia/Mild gastritis  . Hemorrhoid surgery  01/31/2012    Procedure: HEMORRHOIDECTOMY;  Surgeon: Jamesetta So, MD;  Location: [facility];  Service: General;  Laterality: N/A;   Family History  Problem Relation Age of Onset  . Stroke Mother   . Diabetes Mother   . Hypertension Mother   . Hyperlipidemia Mother   . Hypertension Father   . Hyperlipidemia Father   . Colon cancer Neg Hx    History   Social History  . Marital Status: Divorced    Spouse Name: N/A    Number of Children: N/A  . Years of Education: N/A   Occupational History  . Not on file.   Social History Main Topics  . Smoking status: Former Smoker -- 1.00 packs/day for 50 years    Types: Cigarettes  . Smokeless tobacco: Never Used     Comment: May of 2013  . Alcohol Use: No  . Drug Use: No  . Sexual Activity: Not Currently    Birth Control/ Protection: Post-menopausal   Other Topics Concern  . Not on file   Social History Narrative   Former smoker          Review of Systems: Pertinent items are noted in HPI.  Physical Exam: Blood pressure 154/58, pulse 75, temperature 97.5 F (36.4 C), temperature source Oral, resp. rate 18, height 5\' 2"  (1.575 m), weight 145 lb 3.2 oz (65.862 kg), SpO2 100.00%. Physical  Exam  Constitutional: She is oriented to person, place, and time and well-developed, well-nourished, and in no distress.  A&Ox3 for me; on admission was oriented to person and place, but not time.  HENT:  Head: Normocephalic and atraumatic.  Eyes: Conjunctivae and EOM are normal. Pupils are equal, round, and reactive to light.  Neck: Normal range of motion. Neck supple.  Cardiovascular: Normal rate, regular rhythm and normal heart sounds.  Exam reveals no gallop and no friction rub.   No murmur heard. Pulmonary/Chest: Effort normal and breath sounds normal.  No respiratory distress. She has no wheezes. She has no rales. She exhibits no tenderness.  Abdominal: Soft. She exhibits no distension. There is no tenderness.  Musculoskeletal: Normal range of motion. She exhibits no edema and no tenderness.  Neurological: She is alert and oriented to person, place, and time. She displays no weakness (Strength 5/5 and symmetrical bilaterally.), facial symmetry, normal speech and normal reflexes. No cranial nerve deficit or sensory deficit. She exhibits normal muscle tone. She has an abnormal Cerebellar Exam (Some fine motor finger coordination difficulties bilaterally.) and an abnormal Romberg Test (Unsteady during Romberg testing). She has a normal Finger-Nose-Finger Test. She shows no pronator drift. Gait normal. GCS score is 15.  Reflex Scores:      Bicep reflexes are 2+ on the right side and 2+ on the left side.      Brachioradialis reflexes are 2+ on the right side and 2+ on the left side.      Achilles reflexes are 2+ on the right side and 2+ on the left side. Prior exam in ED by neuro during seizure activity: Motor: Moderate weakness of left lower extremity proximally with intermittent seizure activity involving entire leg and foot; normal strength and muscle tone of left upper extremity and right upper and lower extremities. Sensory: Reduced perception of tactile sensation involving left lower  extremity.  Skin: She is not diaphoretic.    Lab results: Basic Metabolic Panel:  Recent Labs  04/07/13 1214 04/07/13 1233 04/07/13 1712  NA 140  --  143  K 3.9  --  4.2  CL 105  --  103  CO2  --   --  26  GLUCOSE 166*  --  163*  BUN 18  --  15  CREATININE 1.00  --  0.82  CALCIUM  --   --  9.4  MG  --  1.9  --   PHOS  --  3.7  --    Liver Function Tests:  Recent Labs  04/07/13 1712  AST 17  ALT 13  ALKPHOS 75  BILITOT PENDING  PROT 7.2  ALBUMIN 3.8   No results found for this basename: LIPASE, AMYLASE,  in the last 72 hours No results found for this basename: AMMONIA,  in the last 72 hours CBC:  Recent Labs  04/07/13 1214 04/07/13 1233  WBC  --  5.1  NEUTROABS  --  3.0  HGB 14.3 12.2  HCT 42.0 36.0  MCV  --  85.3  PLT  --  244   Cardiac Enzymes:  Recent Labs  04/07/13 1233 04/07/13 1712  TROPONINI <0.30 <0.30   BNP: No results found for this basename: PROBNP,  in the last 72 hours D-Dimer: No results found for this basename: DDIMER,  in the last 72 hours CBG:  Recent Labs  04/07/13 1226  GLUCAP 147*   Hemoglobin A1C: No results found for this basename: HGBA1C,  in the last 72 hours Fasting Lipid Panel: No results found for this basename: CHOL, HDL, LDLCALC, TRIG, CHOLHDL, LDLDIRECT,  in the last 72 hours Thyroid Function Tests: No results found for this basename: TSH, T4TOTAL, FREET4, T3FREE, THYROIDAB,  in the last 72 hours Anemia Panel: No results found for this basename: VITAMINB12, FOLATE, FERRITIN, TIBC, IRON, RETICCTPCT,  in the last 72 hours Coagulation: No results found for this basename: LABPROT, INR,  in the last 72 hours Urine Drug Screen: Drugs of Abuse  No results found for this basename: labopia,  cocainscrnur,  labbenz,  amphetmu,  thcu,  labbarb    Alcohol Level: No results found for this basename: ETH,  in the last 72 hours Urinalysis:  Recent Labs  04/07/13 1530  COLORURINE YELLOW  LABSPEC 1.011  PHURINE 5.5   [place]  UROBILINOGEN 0.2  NITRITE NEGATIVE  LEUKOCYTESUR NEGATIVE     Imaging results:  Dg Chest 2 View  04/07/2013   CLINICAL DATA:  STROKE SYMPTOMS  EXAM: CHEST  2 VIEW  COMPARISON:  08/06/2011  FINDINGS: NORMAL HEART SIZE AND VASCULARITY. LUNGS CLEAR. NO FOCAL PNEUMONIA, COLLAPSE OR CONSOLIDATION. NO EFFUSION OR PNEUMOTHORAX. MARKED SCOLIOSIS OF THE SPINE AS BEFORE. NO SIGNIFICANT INTERVAL CHANGE.  IMPRESSION: STABLE CHEST EXAM.  NO ACUTE CHEST DISEASE   Electronically Signed   By: Daryll Brod M.D.   On: 04/07/2013 14:51   Ct Head (brain) Wo Contrast  04/07/2013   CLINICAL DATA:  Code stroke.  Left-sided weakness and numbness.  EXAM: CT HEAD WITHOUT CONTRAST  TECHNIQUE: Contiguous axial images were obtained from the base of the skull through the vertex without intravenous contrast.  COMPARISON:  MRI 02/03/2012.  FINDINGS: Right parietal encephalomalacia is unchanged, compatible with a posterior division MCA infarct. Scattered small lacunar infarcts are present which appeared present also on the prior MRI. This includes a small right cerebellar lacunar infarct. There is no mass lesion, mass effect, midline shift, hydrocephalus, or hemorrhage. No acute infarct. Mucous retention cyst/ polyp in the posterior right ethmoid air cells. Similar findings in the right frontal sinus. Mastoid air cells are clear.  IMPRESSION: 1. No acute intracranial abnormality. 2. Scattered lacunar infarcts and old right parietal infarct/encephalomalacia. 3. Critical Value/emergent results were called by telephone at the time of interpretation on 04/07/2013 at 12:17 PM to Dr. Nicole Kindred, who verbally acknowledged these results.   Electronically Signed   By: Dereck Ligas M.D.   On: 04/07/2013 12:17   Mr Brain Wo Contrast  04/07/2013   CLINICAL DATA:  Seizure. Possible stroke. Weakness following the episode with paresthesia is in the  lower extremities bilaterally. The patient was unable to walk. Her symptoms have been resolving.  EXAM: MRI HEAD WITHOUT CONTRAST  TECHNIQUE: Multiplanar, multiecho pulse sequences of the brain and surrounding structures were obtained without intravenous contrast.  COMPARISON:  CT head without contrast from the same day. MRI brain 02/03/2012.  FINDINGS: The diffusion-weighted images demonstrate no evidence for acute or subacute infarction. No hemorrhage or mass lesion is present. Ventricles are of normal size. A remote posterior frontal and parietal lobe infarct demonstrates continued evolution with contraction of the area of encephalomalacia. Additional remote lacunar infarcts are again noted within the right corona radiata. There is no significant progression of white matter disease. Remote lacunar infarcts are noted within the thalami bilaterally. Remote lacunar infarcts of the cerebellum are stable, right greater than left.  Flow is present in the major intracranial arteries. Chronic opacification of a posterior right ethmoid air cell is stable. The paranasal sinuses are otherwise clear. Minimal fluid is present in the mastoid air cells. No obstructing nasopharyngeal lesion is evident.  IMPRESSION: 1. No acute intracranial abnormality. 2. Continued evolution of encephalomalacia in the posterior right frontal lobe and parietal lobe compatible with the infarct from 07/2011. 3. Additional remote lacunar infarcts are present within the right corona radiata an bilateral thalami.   Electronically Signed   By: Lawrence Santiago M.D.   On: 04/07/2013 16:46    Other results: EKG: Unchanged from  previous tracings, normal sinus rhythm. LVH. No ST/T wave changes.  Assessment & Plan by Problem: Taleyah Hillman is a 68 y.o. female with PMH right cerebral infarction (May 2013), seizure disorder on Keppra, hypertension, hyperlipidemia and dementia, who presents with recurrent breakthrough focal motor seizure activity as well as  weakness involving left lower extremity.  #Left lower extremity weakness - Neuro saw the patient as a code stroke in the ED. At that time she exhibited moderate weakness of left lower extremity proximally with intermittent seizure activity involving entire left leg and foot. On my exam, s/p Ativan and Keppra loading, she had no focal neurologic abnormalities. Only pertinent findings were bilateral fine motor dis-coordination and Rombergism. Her transient weakness was likely a manifestation of lack of control of the extremity during the seizure, or possibly post-ictal Todd's paralysis. Recurrent stroke less likely, but we wanted to rule it out. CT with no acute abnormality. MRI of the brain without contrast was performed to rule out acute right cerebral infarction, and showed no acute intracranial abnormality, continued evolution of encephalomalacia in the posterior right frontal lobe and parietal lobe compatible with the infarct from 07/2011. No further neurodiagnostic studies are indicated, per neurology. - Admit to IMTS, telemetry - Appreciate neuro recs, Dr. Nicole Kindred - Urinalysis - Hemoglobin A1c - Lipid panel - Aspirin 325 mg per day - Ativan 1mg  q4h PRN seizure activity - Orthostatic vital signs - NPO pending RN bedside swallow exam - SLP eval and treat - NIH stroke scale each shift - Neuro checks q2 hours x12 hours, then every 4 hours - Fall precautions - PT/OT eval and treat - CMP,CBC in am  #Recurrent breakthrough focal motor seizure activity - In the context of Keppra non-compliance (takes Keppra 500mg  twice daily at home, missed last 2 doses). In the ED she was noted to have focal motor seizure activity as described above, involving left lower extremity. She remained conscious and was able to continually communicate during this, although she was slightly confused. Seizure activity was controlled after 1 mg of Ativan and a loading dose of Keppra 1000 mg IV. By the time we evaluated her, no  seizure activity was noted. She has a history of breakthrough seizures and in the setting of Keppra noncompliance per daughter. - Keppra 750 mg IV twice a day - Checking a Keppra level - Seizure precautions  #Chest pain, resolved - Patient reported some chest pain prior to and during the seizure. Point of care troponin negative x1. EKG unchanged from previous tracings, normal sinus rhythm. LVH. No ST/T wave changes. Not currently having any chest pain. - Cycle troponins to rule out MI  #HLD - Continue statin.  #HTN - Well controlled currently.  - Re-start home enalapril 5mg  BID  #Dementia - Continue home donepezil 5mg  at bedtime.  #GERD - Continue PPI.  #DVT PPX - Lovenox subcutaneous and SCDs.   Dispo: Disposition is deferred at this time, awaiting improvement of current medical problems. Anticipated discharge in approximately 1-3 day(s).   The patient does have a current PCP (Shannon Rossetti, MD) and does need an [facility] hospital follow-up appointment after discharge.  The patient does not have transportation limitations that hinder transportation to clinic appointments.  Signed: Lesly Dukes, MD 04/07/2013, 6:17 PM

## 2013-04-07 NOTE — Consult Note (Signed)
Reason for Consult: Left lower extremity weakness with recurrent focal seizure.  HPI:                                                                                                                                          [name] is an 68 y.o. female with a history of right cerebral infarction, seizure disorder, hypertension, hyperlipidemia and dementia, presenting with focal motor seizure activity as well as weakness involving left lower extremity. Patient reportedly fell at home. Stroke occurred in 2013. She's been taking Keppra 500 mg twice a day. She arrived in the emergency room and code stroke status because of the complaint of weakness involving her left leg. She was noted to have focal motor seizure activity involving left lower extremity intermittently. Patient remained conscious and was able to continually communicate, although she was slightly confused. She reportedly was last seen normal at 10:40 AM today. However, she was alone history was somewhat unreliable. She was given 1 mg of Ativan which stopped focal seizure activity. She was subsequently given a loading dose of Keppra 1000 mg IV. Admission was recommended for seizure management as well as to rule out recurrent right CVA.  Past Medical History  Diagnosis Date  . Hypertension   . GERD (gastroesophageal reflux disease)   . Stroke May 2013  . Hyperlipidemia   . Dementia, vascular   . Seizure disorder     Past Surgical History  Procedure Laterality Date  . Bunionectomy    . Colonoscopy  10/31/2011     LARGE Internal hemorrhoids, S/P BANDING/ ADENOMATOUS APPEARING Polyp in the ascending colon/ multiple polyps in the  descending, sigmoid   colon and rectumSIMPLE ADENOMAS, SURVEILLANCE 2023  . Esophagogastroduodenoscopy  10/31/2011    LARGE Hiatal hernia/Mild gastritis  . Hemorrhoid surgery  01/31/2012    Procedure: HEMORRHOIDECTOMY;  Surgeon: Jamesetta So, MD;  Location: [facility];  Service: General;  Laterality: N/A;     Family History  Problem Relation Age of Onset  . Stroke Mother   . Diabetes Mother   . Hypertension Mother   . Hyperlipidemia Mother   . Hypertension Father   . Hyperlipidemia Father   . Colon cancer Neg Hx     Social History:  reports that she has quit smoking. Her smoking use included Cigarettes. She has a 50 pack-year smoking history. She has never used smokeless tobacco. She reports that she does not drink alcohol or use illicit drugs.  No Known Allergies  MEDICATIONS:  I have reviewed the patient's current medications.   ROS:                                                                                                                                       History obtained from Granddaughter and the patient  General ROS: negative for - chills, fatigue, fever, night sweats, weight gain or weight loss Psychological ROS: negative for - behavioral disorder, hallucinations, memory difficulties, mood swings or suicidal ideation Ophthalmic ROS: negative for - blurry vision, double vision, eye pain or loss of vision ENT ROS: negative for - epistaxis, nasal discharge, oral lesions, sore throat, tinnitus or vertigo Allergy and Immunology ROS: negative for - hives or itchy/watery eyes Hematological and Lymphatic ROS: negative for - bleeding problems, bruising or swollen lymph nodes Endocrine ROS: negative for - galactorrhea, hair pattern changes, polydipsia/polyuria or temperature intolerance Respiratory ROS: negative for - cough, hemoptysis, shortness of breath or wheezing Cardiovascular ROS: negative for - chest pain, dyspnea on exertion, edema or irregular heartbeat Gastrointestinal ROS: negative for - abdominal pain, diarrhea, hematemesis, nausea/vomiting or stool incontinence Genito-Urinary ROS: negative for - dysuria, hematuria, incontinence or urinary  frequency/urgency Musculoskeletal ROS: negative for - joint swelling or muscular weakness Neurological ROS: as noted in HPI Dermatological ROS: negative for rash and skin lesion changes   Blood pressure 168/81, pulse 80, height 5\' 2"  (1.575 m), weight 63.504 kg (140 lb), SpO2 100.00%.   Neurologic Examination:                                                                                                      Mental Status: Alert, oriented to correct age, but not correct month. Short-term memory was only slightly impaired. Speech fluent without evidence of aphasia. Able to follow commands without difficulty. Cranial Nerves: II-Visual fields were normal. III/IV/VI-Pupils were equal and reacted. Extraocular movements were full and conjugate.    V/VII-no facial numbness and no facial weakness. VIII-normal. X-normal speech. Motor: Moderate weakness of left lower extremity proximally with intermittent seizure activity involving entire leg and foot; normal strength and muscle tone of left upper extremity and right upper and lower extremities. Sensory: Reduced perception of tactile sensation involving left lower extremity. Deep Tendon Reflexes: 2+ and symmetric. Plantars: Flexor bilaterally Cerebellar: Normal finger-to-nose testing.  Results for orders placed during the hospital encounter of 04/07/13 (from the past 48 hour(s))  POCT I-STAT TROPONIN I     Status: None   Collection Time    04/07/13 12:12  PM      Result Value Range   Troponin i, poc 0.01  0.00 - 0.08 ng/mL   Comment 3            Comment: Due to the release kinetics of cTnI,     a negative result within the first hours     of the onset of symptoms does not rule out     myocardial infarction with certainty.     If myocardial infarction is still suspected,     repeat the test at appropriate intervals.  POCT I-STAT, CHEM 8     Status: Abnormal   Collection Time    04/07/13 12:14 PM      Result Value Range   Sodium 140  137  - 147 mEq/L   Potassium 3.9  3.7 - 5.3 mEq/L   Chloride 105  96 - 112 mEq/L   BUN 18  6 - 23 mg/dL   Creatinine, Ser 1.00  0.50 - 1.10 mg/dL   Glucose, Bld 166 (*) 70 - 99 mg/dL   Calcium, Ion 1.17  1.13 - 1.30 mmol/L   TCO2 25  0 - 100 mmol/L   Hemoglobin 14.3  12.0 - 15.0 g/dL   HCT 42.0  36.0 - 46.0 %  GLUCOSE, CAPILLARY     Status: Abnormal   Collection Time    04/07/13 12:26 PM      Result Value Range   Glucose-Capillary 147 (*) 70 - 99 mg/dL   Comment 1 Notify RN      Ct Head (brain) Wo Contrast  04/07/2013   CLINICAL DATA:  Code stroke.  Left-sided weakness and numbness.  EXAM: CT HEAD WITHOUT CONTRAST  TECHNIQUE: Contiguous axial images were obtained from the base of the skull through the vertex without intravenous contrast.  COMPARISON:  MRI 02/03/2012.  FINDINGS: Right parietal encephalomalacia is unchanged, compatible with a posterior division MCA infarct. Scattered small lacunar infarcts are present which appeared present also on the prior MRI. This includes a small right cerebellar lacunar infarct. There is no mass lesion, mass effect, midline shift, hydrocephalus, or hemorrhage. No acute infarct. Mucous retention cyst/ polyp in the posterior right ethmoid air cells. Similar findings in the right frontal sinus. Mastoid air cells are clear.  IMPRESSION: 1. No acute intracranial abnormality. 2. Scattered lacunar infarcts and old right parietal infarct/encephalomalacia. 3. Critical Value/emergent results were called by telephone at the time of interpretation on 04/07/2013 at 12:17 PM to Dr. Nicole Kindred, who verbally acknowledged these results.   Electronically Signed   By: Dereck Ligas M.D.   On: 04/07/2013 12:17   Assessment/Plan: 68 year old lady with history of previous stroke and seizure disorder as well as hypertension hyperlipidemia presenting with recurrent breakthrough focal motor seizure activity. Weakness involving left lower extremity is likely manifestation of lack of control  of the extremity or possibly Todd's paralysis. Recurrent stroke is less likely but cannot be completely ruled out.  Recommendations: 1. Increase Keppra to 750 mg twice a day. 2. MRI of the brain without contrast rule out recurrent acute right cerebral infarction. 3. Stroke workup and risk assessment if MRI shows acute stroke. Otherwise, no further neurodiagnostic studies would be indicated. 4. Aspirin 325 mg per day.  We will continue to follow this patient with you.  C.R. Nicole Kindred, MD [facility] [phone]  04/07/2013, 12:36 PM

## 2013-04-07 NOTE — ED Notes (Signed)
Called Pharmacy x 5 concerning Keppra. Explained to Pharmacist that I have been told that the RX would be tubed or walked to me, but still have yet to receive the medication.

## 2013-04-07 NOTE — ED Notes (Signed)
NOTIFIED PHYSICIANS IN POD  B15 OF PATIENTS CODE STROKE LAB RESULTS @12 :24 PM ,04/07/2013.

## 2013-04-07 NOTE — ED Notes (Signed)
Called pharmacy x 3 concerning Keppra

## 2013-04-07 NOTE — ED Notes (Addendum)
Per [facility]: Pt from home, found by neighbor after pt reports falling. PT LSN by mother in law at 0750 this AM. Pt now AO x 3, presenting with left sided weakness, dizziness, HA, confusion.. Initial BP 200/160, BG 180. Hx stroke with residual left side deficits. Hx seizures, currently taking Keppra. Last took this AM per pt.

## 2013-04-07 NOTE — ED Notes (Signed)
Pt returned from MRI °

## 2013-04-07 NOTE — ED Notes (Signed)
On pt's arrival left leg noted to be twitching. Pt reports "its been twitching for awhile and I can't stop."

## 2013-04-07 NOTE — ED Notes (Signed)
Transport here to take pt to MRI. Dt pt's recent seizure activity, RN doesn't feel comfortable sending to MRI. Pharmacy called x 6 concerning Keppra. Pharmacists states that [place] will be delivered within 10 minutes.

## 2013-04-07 NOTE — ED Provider Notes (Signed)
CSN: [id number]     Arrival date & time 04/07/13  1158 History   First MD Initiated Contact with Patient 04/07/13 1226     Chief Complaint  Patient presents with  . Code Stroke   (Consider location/radiation/quality/duration/timing/severity/associated sxs/prior Treatment) HPI Comments: 68 y.o. female with PMH right cerebral infarction (May 2013), seizure disorder on Keppra, hypertension, hyperlipidemia and dementia, who presents with focal motor seizure activity as well as weakness involving left lower extremity. Patient reports she had a seizure (unwitnessed) at home this morning. She noted soon after that her left side was weak, she was unable to walk - so EMS was called. Since that time her symptoms have steadily resolved.   The history is provided by the patient.    Past Medical History  Diagnosis Date  . Hypertension   . GERD (gastroesophageal reflux disease)   . Stroke May 2013  . Hyperlipidemia   . Dementia, vascular   . Seizure disorder    Past Surgical History  Procedure Laterality Date  . Bunionectomy    . Colonoscopy  10/31/2011     LARGE Internal hemorrhoids, S/P BANDING/ ADENOMATOUS APPEARING Polyp in the ascending colon/ multiple polyps in the  descending, sigmoid   colon and rectumSIMPLE ADENOMAS, SURVEILLANCE 2023  . Esophagogastroduodenoscopy  10/31/2011    LARGE Hiatal hernia/Mild gastritis  . Hemorrhoid surgery  01/31/2012    Procedure: HEMORRHOIDECTOMY;  Surgeon: Jamesetta So, MD;  Location: [facility];  Service: General;  Laterality: N/A;   Family History  Problem Relation Age of Onset  . Stroke Mother   . Diabetes Mother   . Hypertension Mother   . Hyperlipidemia Mother   . Hypertension Father   . Hyperlipidemia Father   . Colon cancer Neg Hx    History  Substance Use Topics  . Smoking status: Former Smoker -- 1.00 packs/day for 50 years    Types: Cigarettes  . Smokeless tobacco: Never Used     Comment: May of 2013  . Alcohol Use: No   OB History    Grav Para Term Preterm Abortions TAB SAB Ect Mult Living                 Review of Systems  Constitutional: Positive for activity change.  Respiratory: Negative for shortness of breath.   Cardiovascular: Negative for chest pain.  Gastrointestinal: Negative for nausea, vomiting and abdominal pain.  Genitourinary: Negative for dysuria.  Musculoskeletal: Negative for neck pain.  Neurological: Positive for seizures and weakness. Negative for headaches.    Allergies  Review of patient's allergies indicates no known allergies.  Home Medications   Current Outpatient Rx  Name  Route  Sig  Dispense  Refill  . acetaminophen (TYLENOL) 500 MG tablet   Oral   Take 1,000 mg by mouth every 6 (six) hours as needed. Pain.         Marland Kitchen aspirin 325 MG EC tablet   Oral   Take 325 mg by mouth daily.         . calcium carbonate (TUMS) 500 MG chewable tablet   Oral   Chew 1 tablet by mouth 3 (three) times daily as needed for indigestion or heartburn.         . enalapril (VASOTEC) 5 MG tablet   Oral   Take 5 mg by mouth 2 (two) times daily.         Marland Kitchen esomeprazole (NEXIUM) 40 MG capsule   Oral   Take 40 mg by mouth daily  at 12 noon.         Marland Kitchen omeprazole (PRILOSEC) 20 MG capsule   Oral   Take 20 mg by mouth daily.         . simvastatin (ZOCOR) 20 MG tablet   Oral   Take 1 tablet (20 mg total) by mouth daily at 6 PM.   90 tablet   1    BP 145/77  Pulse 71  Temp(Src) 99 F (37.2 C) (Oral)  Resp 20  Ht 5\' 2"  (1.575 m)  Wt 140 lb (63.504 kg)  BMI 25.60 kg/m2  SpO2 100% Physical Exam  Nursing note and vitals reviewed. Constitutional: She is oriented to person, place, and time. She appears well-developed and well-nourished.  HENT:  Head: Normocephalic and atraumatic.  Eyes: EOM are normal. Pupils are equal, round, and reactive to light.  Neck: Neck supple.  Cardiovascular: Normal rate, regular rhythm and normal heart sounds.   No murmur heard. Pulmonary/Chest: Effort  normal. No respiratory distress.  Abdominal: Soft. She exhibits no distension. There is no tenderness. There is no rebound and no guarding.  Neurological: She is alert and oriented to person, place, and time. No cranial nerve deficit.  Left sided - mild weakness with grip.  Skin: Skin is warm and dry.    ED Course  Procedures (including critical care time) Labs Review Labs Reviewed  URINALYSIS, ROUTINE W REFLEX MICROSCOPIC - Abnormal; Notable for the following:    Hgb urine dipstick MODERATE (*)    All other components within normal limits  GLUCOSE, CAPILLARY - Abnormal; Notable for the following:    Glucose-Capillary 147 (*)    All other components within normal limits  POCT I-STAT, CHEM 8 - Abnormal; Notable for the following:    Glucose, Bld 166 (*)    All other components within normal limits  CBC WITH DIFFERENTIAL  TROPONIN I  MAGNESIUM  PHOSPHORUS  URINE MICROSCOPIC-ADD ON  LEVETIRACETAM LEVEL  TROPONIN I  TROPONIN I  COMPREHENSIVE METABOLIC PANEL  HEMOGLOBIN A1C  POCT I-STAT TROPONIN I   Imaging Review Dg Chest 2 View  04/07/2013   CLINICAL DATA:  STROKE SYMPTOMS  EXAM: CHEST  2 VIEW  COMPARISON:  08/06/2011  FINDINGS: NORMAL HEART SIZE AND VASCULARITY. LUNGS CLEAR. NO FOCAL PNEUMONIA, COLLAPSE OR CONSOLIDATION. NO EFFUSION OR PNEUMOTHORAX. MARKED SCOLIOSIS OF THE SPINE AS BEFORE. NO SIGNIFICANT INTERVAL CHANGE.  IMPRESSION: STABLE CHEST EXAM.  NO ACUTE CHEST DISEASE   Electronically Signed   By: Daryll Brod M.D.   On: 04/07/2013 14:51   Ct Head (brain) Wo Contrast  04/07/2013   CLINICAL DATA:  Code stroke.  Left-sided weakness and numbness.  EXAM: CT HEAD WITHOUT CONTRAST  TECHNIQUE: Contiguous axial images were obtained from the base of the skull through the vertex without intravenous contrast.  COMPARISON:  MRI 02/03/2012.  FINDINGS: Right parietal encephalomalacia is unchanged, compatible with a posterior division MCA infarct. Scattered small lacunar infarcts are  present which appeared present also on the prior MRI. This includes a small right cerebellar lacunar infarct. There is no mass lesion, mass effect, midline shift, hydrocephalus, or hemorrhage. No acute infarct. Mucous retention cyst/ polyp in the posterior right ethmoid air cells. Similar findings in the right frontal sinus. Mastoid air cells are clear.  IMPRESSION: 1. No acute intracranial abnormality. 2. Scattered lacunar infarcts and old right parietal infarct/encephalomalacia. 3. Critical Value/emergent results were called by telephone at the time of interpretation on 04/07/2013 at 12:17 PM to Dr. Nicole Kindred, who verbally acknowledged  these results.   Electronically Signed   By: Dereck Ligas M.D.   On: 04/07/2013 12:17    EKG Interpretation    Date/Time:  Wednesday April 07 2013 12:15:59 EST Ventricular Rate:  90 PR Interval:  143 QRS Duration: 86 QT Interval:  372 QTC Calculation: 455 R Axis:   64 Text Interpretation:  Sinus rhythm Probable LVH with secondary repol abnrm Confirmed by Kathrynn Humble, MD, Ada Woodbury ([id number]) on 04/07/2013 12:28:59 PM Also confirmed by Kathrynn Humble, MD, Darica Goren ([id number])  on 04/07/2013 4:33:44 PM            MDM   1. Acute embolic stroke   2. Seizure   3. HTN (hypertension)   4. Vascular dementia    DDx includes:  Stroke - ischemic vs. hemorrhagic TIA Neuropathy Myelitis Electrolyte abnormality Neuropathy Muscular disease  Pt with hx of stroke and seizures comes in with left sided weakness. EMS activated code stroke per their protocol - and Neuro was at bedside. Code stroke was eventually cancelled. Pt's sx getting better - not a tpa candidate per Neurology. Will admit per their request.    Varney Biles, MD 04/07/13 [phone]

## 2013-04-07 NOTE — ED Notes (Signed)
Pt ambulated to restroom with standby assist. AO x4. Family at bedside.

## 2013-04-07 NOTE — Telephone Encounter (Signed)
Changed med to protonix due to [employer] formulary change

## 2013-04-07 NOTE — ED Notes (Signed)
Notified RN of CBG [id number]

## 2013-04-08 DIAGNOSIS — F0390 Unspecified dementia without behavioral disturbance: Secondary | ICD-10-CM

## 2013-04-08 DIAGNOSIS — R569 Unspecified convulsions: Secondary | ICD-10-CM | POA: Diagnosis not present

## 2013-04-08 DIAGNOSIS — G40802 Other epilepsy, not intractable, without status epilepticus: Secondary | ICD-10-CM

## 2013-04-08 DIAGNOSIS — I10 Essential (primary) hypertension: Secondary | ICD-10-CM | POA: Diagnosis not present

## 2013-04-08 DIAGNOSIS — K219 Gastro-esophageal reflux disease without esophagitis: Secondary | ICD-10-CM

## 2013-04-08 DIAGNOSIS — Z8673 Personal history of transient ischemic attack (TIA), and cerebral infarction without residual deficits: Secondary | ICD-10-CM

## 2013-04-08 DIAGNOSIS — F0150 Vascular dementia without behavioral disturbance: Secondary | ICD-10-CM | POA: Diagnosis not present

## 2013-04-08 DIAGNOSIS — E785 Hyperlipidemia, unspecified: Secondary | ICD-10-CM

## 2013-04-08 LAB — LIPID PANEL
Cholesterol: 146 mg/dL (ref 0–200)
HDL: 68 mg/dL (ref >39)
LDL Cholesterol: 56 mg/dL (ref 0–99)
Total CHOL/HDL Ratio: 2.1 RATIO
Triglycerides: 109 mg/dL (ref <150)
VLDL: 22 mg/dL (ref 0–40)

## 2013-04-08 LAB — TROPONIN I

## 2013-04-08 LAB — LEVETIRACETAM LEVEL: Levetiracetam Lvl: 36.8 ug/mL — ABNORMAL HIGH (ref 5.0–30.0)

## 2013-04-08 MED ORDER — LEVETIRACETAM 750 MG PO TABS: 750.0000 mg | ORAL_TABLET | Freq: Two times a day (BID) | ORAL | Status: DC

## 2013-04-08 MED ORDER — LEVETIRACETAM 750 MG PO TABS
750.0000 mg | ORAL_TABLET | Freq: Two times a day (BID) | ORAL | Status: DC
  Administered 2013-04-08: 750 mg via ORAL
  Filled 2013-04-08 (×4): qty 1

## 2013-04-08 NOTE — Progress Notes (Addendum)
Subjective: Patient seen and examined at the bedside this morning. She feels well. She denies chest pain, SOB, numbness, weakness. PT already saw the patient and recommended No PT follow up;Supervision - Intermittent. She lives with her daughter [name] and would like me to call her with an update and discharge plan.  Objective: Vital signs in last 24 hours: Filed Vitals:   04/08/13 0800 04/08/13 0953 04/08/13 0954 04/08/13 0956  BP: 148/77 155/67 151/82 175/77  Pulse:  66    Temp:  98 F (36.7 C)    TempSrc:  Oral    Resp:  18    Height:      Weight:      SpO2:  100% 100%    Weight change:   Intake/Output Summary (Last 24 hours) at 04/08/13 1020 Last data filed at 04/07/13 2000  Gross per 24 hour  Intake    240 ml  Output      0 ml  Net    240 ml   Physical Exam  Constitutional: She is oriented to person, place, and time and well-developed, well-nourished, and in no distress.  A&Ox3. HENT:  Head: Normocephalic and atraumatic.  Eyes: Conjunctivae and EOM are normal. Pupils are equal, round, and reactive to light.  Neck: Normal range of motion. Neck supple.  Cardiovascular: Normal rate, regular rhythm and normal heart sounds. Exam reveals no gallop and no friction rub.  No murmur heard.  Pulmonary/Chest: Effort normal and breath sounds normal. No respiratory distress. She has no wheezes. She has no rales. She exhibits no tenderness.  Abdominal: Soft. She exhibits no distension. There is no tenderness.  Musculoskeletal: Normal range of motion. She exhibits no edema and no tenderness.  Neurological: She is alert and oriented to person, place, and time. She displays no weakness (Strength 5/5 and symmetrical bilaterally.), facial symmetry, normal speech and normal reflexes. No cranial nerve deficit or sensory deficit. She exhibits normal muscle tone.  Skin: She is not diaphoretic.   Lab Results: Basic Metabolic Panel:  Recent Labs Lab 04/07/13 1214 04/07/13 1233  04/07/13 1712  NA 140  --  143  K 3.9  --  4.2  CL 105  --  103  CO2  --   --  26  GLUCOSE 166*  --  163*  BUN 18  --  15  CREATININE 1.00  --  0.82  CALCIUM  --   --  9.4  MG  --  1.9  --   PHOS  --  3.7  --    Liver Function Tests:  Recent Labs Lab 04/07/13 1712  AST 17  ALT 13  ALKPHOS 75  BILITOT <0.2*  PROT 7.2  ALBUMIN 3.8   No results found for this basename: LIPASE, AMYLASE,  in the last 168 hours No results found for this basename: AMMONIA,  in the last 168 hours CBC:  Recent Labs Lab 04/07/13 1214 04/07/13 1233  WBC  --  5.1  NEUTROABS  --  3.0  HGB 14.3 12.2  HCT 42.0 36.0  MCV  --  85.3  PLT  --  244   Cardiac Enzymes:  Recent Labs Lab 04/07/13 1233 04/07/13 1712 04/08/13 0024  TROPONINI <0.30 <0.30 <0.30   BNP: No results found for this basename: PROBNP,  in the last 168 hours D-Dimer: No results found for this basename: DDIMER,  in the last 168 hours CBG:  Recent Labs Lab 04/07/13 1226  GLUCAP 147*   Hemoglobin A1C:  Recent Labs  Lab 04/07/13 1712  HGBA1C 6.0*   Fasting Lipid Panel:  Recent Labs Lab 04/08/13 0347  CHOL 146  HDL 68  LDLCALC 56  TRIG 109  CHOLHDL 2.1   Thyroid Function Tests: No results found for this basename: TSH, T4TOTAL, FREET4, T3FREE, THYROIDAB,  in the last 168 hours Coagulation: No results found for this basename: LABPROT, INR,  in the last 168 hours Anemia Panel: No results found for this basename: VITAMINB12, FOLATE, FERRITIN, TIBC, IRON, RETICCTPCT,  in the last 168 hours Urine Drug Screen: Drugs of Abuse  No results found for this basename: labopia,  cocainscrnur,  labbenz,  amphetmu,  thcu,  labbarb    Alcohol Level: No results found for this basename: ETH,  in the last 168 hours Urinalysis:  Recent Labs Lab 04/07/13 1530  COLORURINE YELLOW  LABSPEC 1.011  PHURINE 5.5  GLUCOSEU NEGATIVE  HGBUR MODERATE*  BILIRUBINUR NEGATIVE  KETONESUR NEGATIVE  PROTEINUR NEGATIVE   UROBILINOGEN 0.2  NITRITE NEGATIVE  LEUKOCYTESUR NEGATIVE    Micro Results: No results found for this or any previous visit (from the past 240 hour(s)). Studies/Results: Dg Chest 2 View  04/07/2013   CLINICAL DATA:  STROKE SYMPTOMS  EXAM: CHEST  2 VIEW  COMPARISON:  08/06/2011  FINDINGS: NORMAL HEART SIZE AND VASCULARITY. LUNGS CLEAR. NO FOCAL PNEUMONIA, COLLAPSE OR CONSOLIDATION. NO EFFUSION OR PNEUMOTHORAX. MARKED SCOLIOSIS OF THE SPINE AS BEFORE. NO SIGNIFICANT INTERVAL CHANGE.  IMPRESSION: STABLE CHEST EXAM.  NO ACUTE CHEST DISEASE   Electronically Signed   By: Daryll Brod M.D.   On: 04/07/2013 14:51   Ct Head (brain) Wo Contrast  04/07/2013   CLINICAL DATA:  Code stroke.  Left-sided weakness and numbness.  EXAM: CT HEAD WITHOUT CONTRAST  TECHNIQUE: Contiguous axial images were obtained from the base of the skull through the vertex without intravenous contrast.  COMPARISON:  MRI 02/03/2012.  FINDINGS: Right parietal encephalomalacia is unchanged, compatible with a posterior division MCA infarct. Scattered small lacunar infarcts are present which appeared present also on the prior MRI. This includes a small right cerebellar lacunar infarct. There is no mass lesion, mass effect, midline shift, hydrocephalus, or hemorrhage. No acute infarct. Mucous retention cyst/ polyp in the posterior right ethmoid air cells. Similar findings in the right frontal sinus. Mastoid air cells are clear.  IMPRESSION: 1. No acute intracranial abnormality. 2. Scattered lacunar infarcts and old right parietal infarct/encephalomalacia. 3. Critical Value/emergent results were called by telephone at the time of interpretation on 04/07/2013 at 12:17 PM to Dr. Nicole Kindred, who verbally acknowledged these results.   Electronically Signed   By: Dereck Ligas M.D.   On: 04/07/2013 12:17   Mr Brain Wo Contrast  04/07/2013   CLINICAL DATA:  Seizure. Possible stroke. Weakness following the episode with paresthesia is in the lower  extremities bilaterally. The patient was unable to walk. Her symptoms have been resolving.  EXAM: MRI HEAD WITHOUT CONTRAST  TECHNIQUE: Multiplanar, multiecho pulse sequences of the brain and surrounding structures were obtained without intravenous contrast.  COMPARISON:  CT head without contrast from the same day. MRI brain 02/03/2012.  FINDINGS: The diffusion-weighted images demonstrate no evidence for acute or subacute infarction. No hemorrhage or mass lesion is present. Ventricles are of normal size. A remote posterior frontal and parietal lobe infarct demonstrates continued evolution with contraction of the area of encephalomalacia. Additional remote lacunar infarcts are again noted within the right corona radiata. There is no significant progression of white matter disease. Remote lacunar infarcts are noted  within the thalami bilaterally. Remote lacunar infarcts of the cerebellum are stable, right greater than left.  Flow is present in the major intracranial arteries. Chronic opacification of a posterior right ethmoid air cell is stable. The paranasal sinuses are otherwise clear. Minimal fluid is present in the mastoid air cells. No obstructing nasopharyngeal lesion is evident.  IMPRESSION: 1. No acute intracranial abnormality. 2. Continued evolution of encephalomalacia in the posterior right frontal lobe and parietal lobe compatible with the infarct from 07/2011. 3. Additional remote lacunar infarcts are present within the right corona radiata an bilateral thalami.   Electronically Signed   By: Lawrence Santiago M.D.   On: 04/07/2013 16:46   Medications: I have reviewed the patient's current medications. Scheduled Meds: . aspirin EC  325 mg Oral Daily  . enalapril  5 mg Oral BID  . enoxaparin (LOVENOX) injection  40 mg Subcutaneous Q24H  . levETIRAcetam  750 mg Oral BID  . pantoprazole  40 mg Oral Daily  . simvastatin  20 mg Oral q1800  . sodium chloride  3 mL Intravenous Q12H   Continuous Infusions:   PRN Meds:.sodium chloride, acetaminophen, LORazepam, sodium chloride Assessment/Plan: [name] is a 68 y.o. female with PMH right cerebral infarction (May 2013), seizure disorder on Keppra, hypertension, hyperlipidemia and dementia, who presents with recurrent breakthrough focal motor seizure activity as well as weakness involving left lower extremity.   #Left lower extremity weakness, resolved - Her transient weakness was likely a manifestation of lack of control of the extremity during the seizure, vs. post-ictal Todd's paralysis. We have ruled out recurrent stroke on MRI. No further neurodiagnostic studies indicated, per neurology. A1C > 6.0. Lipid panel > TC 146, LDL 56. UA unremarkable. She is not orthostatic on vital signs. PT recommends no follow up and intermittent supervision. - Appreciate neuro recs, Dr. Nicole Kindred  - Aspirin 325 mg per day  - Ativan 1mg  q4h PRN seizure activity  - NIH stroke scale each shift  - Neuro checks q2 hours x12 hours, then every 4 hours  - Heart healthy diet - Fall precautions  - Medically stable for discharge today with close PCP and neurology follow up, which I will arrange  #Recurrent breakthrough focal motor seizure activity - In the context of Keppra non-compliance. No seizure activity noted overnight s/p Keppra loading. Keppra level 36.8 but drawn after loading in the ED. Spoke with Dr. Nicole Kindred and he agrees with discharge plan. He would like to keep her on the higher dose of Keppra going forward. - Continue Keppra 750 mg po BID per neuro - Seizure precautions  - Will arrange for close neurology follow up, Dr. Trey Sailors A. [place] at [facility], [phone]  #Chest pain, resolved - Patient reported some chest pain prior to and during the seizure. None currently. EKG unchanged from previous tracings, normal sinus rhythm. LVH. No ST/T wave changes. We ruled out MI with serial troponins.  #HLD - Continue statin.   #HTN - Continue home enalapril 5mg   BID.  #Dementia - Continue home donepezil 5mg  at bedtime.   #GERD - Continue PPI.   #DVT PPX - Lovenox subcutaneous and SCDs.   Dispo: Disposition is deferred at this time, awaiting improvement of current medical problems.  Anticipated discharge in approximately 0-1 day(s).   The patient does have a current PCP (Alycia Rossetti, MD) and does need an [facility] hospital follow-up appointment after discharge.  The patient does not have transportation limitations that hinder transportation to clinic appointments.  .Services  Needed at time of discharge: Y = Yes, Blank = No PT:   OT:   RN:   Equipment:   Other:     LOS: 1 day   Lesly Dukes, MD 04/08/2013, 10:20 AM

## 2013-04-08 NOTE — Progress Notes (Signed)
UR complete.  Valora Norell RN, MSN 

## 2013-04-08 NOTE — Discharge Summary (Signed)
Name: [name] MRN: [id number] DOB: Feb 12, 1946 68 y.o. PCP: Shannon Rossetti, MD  Date of Admission: 04/07/2013 12:00 PM Date of Discharge: 04/08/2013 Attending Physician: Shannon Pea, DO  Discharge Diagnosis: 1. Left lower extremity weakness in the setting of seizure, resolved 2. Recurrent breakthrough focal motor seizure activity 3. Chest pain, resolved 4. HLD 5. HTN 6. Dementia  Discharge Medications:   Medication List         acetaminophen 500 MG tablet  Commonly known as:  TYLENOL  Take 1,000 mg by mouth every 6 (six) hours as needed. Pain.     aspirin 325 MG EC tablet  Take 325 mg by mouth daily.     enalapril 5 MG tablet  Commonly known as:  VASOTEC  Take 5 mg by mouth 2 (two) times daily.     esomeprazole 40 MG capsule  Commonly known as:  NEXIUM  Take 40 mg by mouth daily at 12 noon.     levETIRAcetam 750 MG tablet  Commonly known as:  KEPPRA  Take 1 tablet (750 mg total) by mouth 2 (two) times daily.     omeprazole 20 MG capsule  Commonly known as:  PRILOSEC  Take 20 mg by mouth daily.     pantoprazole 40 MG tablet  Commonly known as:  PROTONIX  Take 1 tablet (40 mg total) by mouth daily.     simvastatin 20 MG tablet  Commonly known as:  ZOCOR  Take 1 tablet (20 mg total) by mouth daily at 6 PM.     TUMS 500 MG chewable tablet  Generic drug:  calcium carbonate  Chew 1 tablet by mouth 3 (three) times daily as needed for indigestion or heartburn.        Disposition and follow-up:   [name] was discharged from [name] in Good condition.  At the hospital follow up visit please address:  1.  Keppra compliance. Recurrent seizures? Recurrent weakness?  2.  Labs / imaging needed at time of follow-up: None  3.  Pending labs/ test needing follow-up: None  Follow-up Appointments:     Follow-up Information   Follow up with Shannon Odor, MD On 04/15/2013. (@11 :45am. This is your neurologist.)    Specialty:  Neurology    Contact information:   [address] [place] [id number] [phone]       Follow up with Shannon Blackbird, MD On 04/14/2013. (@2 :45pm. This is your primary care doctor.)    Specialty:  Family Medicine   Contact information:   [address] [place] [address] [phone]       Discharge Instructions: Discharge Orders   Future Appointments Provider Department Dept Phone   04/14/2013 2:45 PM Shannon Rossetti, MD [facility] [phone]   04/30/2013 10:00 AM Shannon Rossetti, MD [facility] [phone]   Future Orders Complete By Expires   Diet - low sodium heart healthy  As directed    Increase activity slowly  As directed       Consultations: Treatment Team:  Shannon Hartshorn, MD  Procedures Performed:  Dg Chest 2 View  04/07/2013   CLINICAL DATA:  STROKE SYMPTOMS  EXAM: CHEST  2 VIEW  COMPARISON:  08/06/2011  FINDINGS: NORMAL HEART SIZE AND VASCULARITY. LUNGS CLEAR. NO FOCAL PNEUMONIA, COLLAPSE OR CONSOLIDATION. NO EFFUSION OR PNEUMOTHORAX. MARKED SCOLIOSIS OF THE SPINE AS BEFORE. NO SIGNIFICANT INTERVAL CHANGE.  IMPRESSION: STABLE CHEST EXAM.  NO ACUTE CHEST DISEASE  Electronically Signed   By: [name] M.D.   On: 04/07/2013 14:51   Ct Head (brain) Wo Contrast  04/07/2013   CLINICAL DATA:  Code stroke.  Left-sided weakness and numbness.  EXAM: CT HEAD WITHOUT CONTRAST  TECHNIQUE: Contiguous axial images were obtained from the base of the skull through the vertex without intravenous contrast.  COMPARISON:  MRI 02/03/2012.  FINDINGS: Right parietal encephalomalacia is unchanged, compatible with a posterior division MCA infarct. Scattered small lacunar infarcts are present which appeared present also on the prior MRI. This includes a small right cerebellar lacunar infarct. There is no mass lesion, mass effect, midline shift, hydrocephalus, or hemorrhage. No acute infarct. Mucous retention cyst/ polyp in the posterior right  ethmoid air cells. Similar findings in the right frontal sinus. Mastoid air cells are clear.  IMPRESSION: 1. No acute intracranial abnormality. 2. Scattered lacunar infarcts and old right parietal infarct/encephalomalacia. 3. Critical Value/emergent results were called by telephone at the time of interpretation on 04/07/2013 at 12:17 PM to Dr. Nicole [name], who verbally acknowledged these results.   Electronically Signed   By: [name] M.D.   On: 04/07/2013 12:17   Mr Brain Wo Contrast  04/07/2013   CLINICAL DATA:  Seizure. Possible stroke. Weakness following the episode with paresthesia is in the lower extremities bilaterally. The patient was unable to walk. Her symptoms have been resolving.  EXAM: MRI HEAD WITHOUT CONTRAST  TECHNIQUE: Multiplanar, multiecho pulse sequences of the brain and surrounding structures were obtained without intravenous contrast.  COMPARISON:  CT head without contrast from the same day. MRI brain 02/03/2012.  FINDINGS: The diffusion-weighted images demonstrate no evidence for acute or subacute infarction. No hemorrhage or mass lesion is present. Ventricles are of normal size. A remote posterior frontal and parietal lobe infarct demonstrates continued evolution with contraction of the area of encephalomalacia. Additional remote lacunar infarcts are again noted within the right corona radiata. There is no significant progression of white matter disease. Remote lacunar infarcts are noted within the thalami bilaterally. Remote lacunar infarcts of the cerebellum are stable, right greater than left.  Flow is present in the major intracranial arteries. Chronic opacification of a posterior right ethmoid air cell is stable. The paranasal sinuses are otherwise clear. Minimal fluid is present in the mastoid air cells. No obstructing nasopharyngeal lesion is evident.  IMPRESSION: 1. No acute intracranial abnormality. 2. Continued evolution of encephalomalacia in the posterior right frontal lobe and  parietal lobe compatible with the infarct from 07/2011. 3. Additional remote lacunar infarcts are present within the right corona radiata an bilateral thalami.   Electronically Signed   By: [name] M.D.   On: 04/07/2013 16:46    Admission HPI:  [name] is a 68 y.o. female with PMH right cerebral infarction (May 2013), seizure disorder on Keppra, hypertension, hyperlipidemia and dementia, who presents with focal motor seizure activity as well as weakness involving left lower extremity. History is provided by the patient and her daughter at the bedside, [name].   Patient reports she had a seizure at home this morning. She thinks it lasted 10-15 minutes, but this may be unreliable as seizure was un-witnessed. She fell to the floor and says she not hit her head. She denies losing consciousness, but also says she remember much about the event. After the stroke she was weak and "tingly" in her bilateral lower extremities. She could not walk, so she began screaming until her neighbors found her. They called EMS. Since that time her  symptoms have steadily resolved. She had no loss of bowel or bladder control, denies tongue biting, but does endorse post-ictal confusion which has resolved.   Daughter says patient was complaining of chest pain the night before the event. She does endorse some chest pain with the seizure and neighbors reportedly noted her clutching her chest when they found her. Denies current chest pain, shortness of breath, nausea, vomiting, dysuria, urinary frequency, weakness, numbness.   She says she has had a seizure disorder since her stroke in May 2013. She follows with a neurologist in [place], but she cannot recall his name. She is supposed to take Keppra 500 mg twice a day, but had missed two doses prior to this morning's event. Her daughter says her last seizure was about 6 months ago, again after she ran out of her medicine. She had a similar transient episode of left-sided  weakness at that time. She has some very mild baseline left-sided weakness residual from her stroke. Her PCP is Dr. Buelah Manis with [facility].   When she arrived in the emergency room, she was labelled a code stroke due to her complaint of weakness involving her legs. She was noted to have focal motor seizure activity involving left lower extremity intermittently. She was given 1 mg of Ativan, which stopped focal seizure activity. She was subsequently given a loading dose of Keppra 1000 mg IV. Admission to IMTS was recommended for seizure management as well as to rule out recurrent CVA.  Physical Exam:  Blood pressure 154/58, pulse 75, temperature 97.5 F (36.4 C), temperature source Oral, resp. rate 18, height 5\' 2"  (1.575 m), weight 145 lb 3.2 oz (65.862 kg), SpO2 100.00%.  Physical Exam  Constitutional: She is oriented to person, place, and time and well-developed, well-nourished, and in no distress.  A&Ox3 for me; on admission was oriented to person and place, but not time.  HENT:  Head: Normocephalic and atraumatic.  Eyes: Conjunctivae and EOM are normal. Pupils are equal, round, and reactive to light.  Neck: Normal range of motion. Neck supple.  Cardiovascular: Normal rate, regular rhythm and normal heart sounds. Exam reveals no gallop and no friction rub.  No murmur heard.  Pulmonary/Chest: Effort normal and breath sounds normal. No respiratory distress. She has no wheezes. She has no rales. She exhibits no tenderness.  Abdominal: Soft. She exhibits no distension. There is no tenderness.  Musculoskeletal: Normal range of motion. She exhibits no edema and no tenderness.  Neurological: She is alert and oriented to person, place, and time. She displays no weakness (Strength 5/5 and symmetrical bilaterally.), facial symmetry, normal speech and normal reflexes. No cranial nerve deficit or sensory deficit. She exhibits normal muscle tone. She has an abnormal Cerebellar Exam (Some  fine motor finger coordination difficulties bilaterally.) and an abnormal Romberg Test (Unsteady during Romberg testing). She has a normal Finger-Nose-Finger Test. She shows no pronator drift. Gait normal. GCS score is 15.  Reflex Scores:  Bicep reflexes are 2+ on the right side and 2+ on the left side.  Brachioradialis reflexes are 2+ on the right side and 2+ on the left side.  Achilles reflexes are 2+ on the right side and 2+ on the left side. Prior exam in ED by neuro during seizure activity: Motor: Moderate weakness of left lower extremity proximally with intermittent seizure activity involving entire leg and foot; normal strength and muscle tone of left upper extremity and right upper and lower extremities. Sensory: Reduced perception of tactile sensation involving left  lower extremity.  Skin: She is not diaphoretic.    Hospital Course by problem list: [name] is a 68 y.o. female with PMH right cerebral infarction (May 2013), seizure disorder on Keppra, hypertension, hyperlipidemia and dementia, who presents with recurrent breakthrough focal motor seizure activity as well as weakness involving left lower extremity.   1. Left lower extremity weakness in the setting of seizure, resolved - Neuro saw the patient as a code stroke in the ED. At that time she exhibited moderate weakness of left lower extremity proximally with intermittent seizure activity involving entire left leg and foot. On my exam, s/p Ativan and Keppra loading, she had no focal neurologic abnormalities. Only pertinent findings were bilateral fine motor dis-coordination and Rombergism. Her transient weakness was likely a manifestation of lack of control of the extremity during the seizure, or possibly post-ictal Todd's paralysis. CT showed no acute abnormality. MRI of the brain without contrast was performed to rule out acute right cerebral infarction, and showed no acute intracranial abnormality, continued evolution of  encephalomalacia in the posterior right frontal lobe and parietal lobe compatible with per known prior infarct from 07/2011. No further neurodiagnostic studies were indicated, per neurology. A1C > 6.0. Lipid panel > TC 146, LDL 56. UA unremarkable. She was not orthostatic on vital signs. PT/OT evaluated the patient and recommended no follow up and intermittent supervision. Keppra dose was increased as below. We arranged for close outpatient follow up with her personal neurologist and PCP.  2. Recurrent breakthrough focal motor seizure activity - In the ED she was noted to have focal motor seizure activity as described above, involving the left lower extremity. She remained conscious and was able to continually communicate during this, although she was slightly confused. Seizure activity was controlled after 1 mg of Ativan and a loading dose of Keppra 1000 mg IV. By the time we evaluated her, no seizure activity was noted. She has a history of breakthrough seizures in the setting of Keppra noncompliance per daughter. Keppra level was checked and elevated at 36.8, but this was drawn after loading in the ED. Neurology recommended increasing Keppra to 750 mg po BID. We did this and arranged for close outpatient follow up with her personal neurologist and PCP.  3. Chest pain, resolved - Patient reported some chest pain prior to and during the seizure. None at our evaluation. EKG was unchanged from previous tracings, normal sinus rhythm. LVH. No ST/T wave changes. We ruled out MI with serial troponins, which were negative x3.   4. HLD - We continued statin.   5. HTN - We continued her home enalapril 5mg  BID.   6. Dementia - We continued her home donepezil 5mg  at bedtime.    Discharge Vitals:   BP 175/77  Pulse 66  Temp(Src) 98 F (36.7 C) (Oral)  Resp 18  Ht 5\' 2"  (1.575 m)  Wt 145 lb 3.2 oz (65.862 kg)  BMI 26.55 kg/m2  SpO2 100%  Discharge Labs:  Results for orders placed during the hospital  encounter of 04/07/13 (from the past 24 hour(s))  POCT I-STAT TROPONIN I     Status: None   Collection Time    04/07/13 12:12 PM      Result Value Range   Troponin i, poc 0.01  0.00 - 0.08 ng/mL   Comment 3           POCT I-STAT, CHEM 8     Status: Abnormal   Collection Time    04/07/13 12:14  PM      Result Value Range   Sodium 140  137 - 147 mEq/L   Potassium 3.9  3.7 - 5.3 mEq/L   Chloride 105  96 - 112 mEq/L   BUN 18  6 - 23 mg/dL   Creatinine, Ser 1.00  0.50 - 1.10 mg/dL   Glucose, Bld 166 (*) 70 - 99 mg/dL   Calcium, Ion 1.17  1.13 - 1.30 mmol/L   TCO2 25  0 - 100 mmol/L   Hemoglobin 14.3  12.0 - 15.0 g/dL   HCT 42.0  36.0 - 46.0 %  GLUCOSE, CAPILLARY     Status: Abnormal   Collection Time    04/07/13 12:26 PM      Result Value Range   Glucose-Capillary 147 (*) 70 - 99 mg/dL   Comment 1 Notify RN    CBC WITH DIFFERENTIAL     Status: None   Collection Time    04/07/13 12:33 PM      Result Value Range   WBC 5.1  4.0 - 10.5 K/uL   RBC 4.22  3.87 - 5.11 MIL/uL   Hemoglobin 12.2  12.0 - 15.0 g/dL   HCT 36.0  36.0 - 46.0 %   MCV 85.3  78.0 - 100.0 fL   MCH 28.9  26.0 - 34.0 pg   MCHC 33.9  30.0 - 36.0 g/dL   RDW 14.0  11.5 - 15.5 %   Platelets 244  150 - 400 K/uL   Neutrophils Relative % 58  43 - 77 %   Neutro Abs 3.0  1.7 - 7.7 K/uL   Lymphocytes Relative 35  12 - 46 %   Lymphs Abs 1.8  0.7 - 4.0 K/uL   Monocytes Relative 6  3 - 12 %   Monocytes Absolute 0.3  0.1 - 1.0 K/uL   Eosinophils Relative 1  0 - 5 %   Eosinophils Absolute 0.0  0.0 - 0.7 K/uL   Basophils Relative 0  0 - 1 %   Basophils Absolute 0.0  0.0 - 0.1 K/uL  TROPONIN I     Status: None   Collection Time    04/07/13 12:33 PM      Result Value Range   Troponin I <0.30  <0.30 ng/mL  MAGNESIUM     Status: None   Collection Time    04/07/13 12:33 PM      Result Value Range   Magnesium 1.9  1.5 - 2.5 mg/dL  PHOSPHORUS     Status: None   Collection Time    04/07/13 12:33 PM      Result Value Range    Phosphorus 3.7  2.3 - 4.6 mg/dL  URINALYSIS, ROUTINE W REFLEX MICROSCOPIC     Status: Abnormal   Collection Time    04/07/13  3:30 PM      Result Value Range   Color, Urine YELLOW  YELLOW   APPearance CLEAR  CLEAR   Specific Gravity, Urine 1.011  1.005 - 1.030   pH 5.5  5.0 - 8.0   Glucose, UA NEGATIVE  NEGATIVE mg/dL   Hgb urine dipstick MODERATE (*) NEGATIVE   Bilirubin Urine NEGATIVE  NEGATIVE   Ketones, ur NEGATIVE  NEGATIVE mg/dL   Protein, ur NEGATIVE  NEGATIVE mg/dL   Urobilinogen, UA 0.2  0.0 - 1.0 mg/dL   Nitrite NEGATIVE  NEGATIVE   Leukocytes, UA NEGATIVE  NEGATIVE  URINE MICROSCOPIC-ADD ON     Status: None   Collection Time  04/07/13  3:30 PM      Result Value Range   Squamous Epithelial / LPF RARE  RARE   RBC / HPF 7-10  <3 RBC/hpf   Bacteria, UA RARE  RARE  LEVETIRACETAM LEVEL     Status: Abnormal   Collection Time    04/07/13  5:12 PM      Result Value Range   Levetiracetam Lvl 36.8 (*) 5.0 - 30.0 ug/mL  TROPONIN I     Status: None   Collection Time    04/07/13  5:12 PM      Result Value Range   Troponin I <0.30  <0.30 ng/mL  COMPREHENSIVE METABOLIC PANEL     Status: Abnormal   Collection Time    04/07/13  5:12 PM      Result Value Range   Sodium 143  137 - 147 mEq/L   Potassium 4.2  3.7 - 5.3 mEq/L   Chloride 103  96 - 112 mEq/L   CO2 26  19 - 32 mEq/L   Glucose, Bld 163 (*) 70 - 99 mg/dL   BUN 15  6 - 23 mg/dL   Creatinine, Ser 0.82  0.50 - 1.10 mg/dL   Calcium 9.4  8.4 - 10.5 mg/dL   Total Protein 7.2  6.0 - 8.3 g/dL   Albumin 3.8  3.5 - 5.2 g/dL   AST 17  0 - 37 U/L   ALT 13  0 - 35 U/L   Alkaline Phosphatase 75  39 - 117 U/L   Total Bilirubin <0.2 (*) 0.3 - 1.2 mg/dL   GFR calc non Af Amer 71 (*) >90 mL/min   GFR calc Af Amer 83 (*) >90 mL/min  HEMOGLOBIN A1C     Status: Abnormal   Collection Time    04/07/13  5:12 PM      Result Value Range   Hemoglobin A1C 6.0 (*) <5.7 %   Mean Plasma Glucose 126 (*) <117 mg/dL  TROPONIN I      Status: None   Collection Time    04/08/13 12:24 AM      Result Value Range   Troponin I <0.30  <0.30 ng/mL  LIPID PANEL     Status: None   Collection Time    04/08/13  3:47 AM      Result Value Range   Cholesterol 146  0 - 200 mg/dL   Triglycerides 109  <150 mg/dL   HDL 68  >39 mg/dL   Total CHOL/HDL Ratio 2.1     VLDL 22  0 - 40 mg/dL   LDL Cholesterol 56  0 - 99 mg/dL    Signed: Lesly Dukes, MD 04/08/2013, 10:43 AM   Time Spent on Discharge: 35 minutes Services Ordered on Discharge: None Equipment Ordered on Discharge: None

## 2013-04-08 NOTE — Evaluation (Signed)
Physical Therapy Evaluation Patient Details Name: [name] MRN: [id number] DOB: 1945/05/18 Today's Date: 04/08/2013 Time: 8315-1761 PT Time Calculation (min): 17 min  PT Assessment / Plan / Recommendation History of Present Illness  pt presents post seizure with L sided weakness, which has now resolved.    Clinical Impression  Pt moving well and per pt is back to baseline.  No LOB or deficits noticed.  No further PT needs at this time, will sign off.      PT Assessment  Patent does not need any further PT services    Follow Up Recommendations  No PT follow up;Supervision - Intermittent    Does the patient have the potential to tolerate intense rehabilitation      Barriers to Discharge        Equipment Recommendations  None recommended by PT    Recommendations for Other Services     Frequency      Precautions / Restrictions Precautions Precautions: None Restrictions Weight Bearing Restrictions: No   Pertinent Vitals/Pain Denied pain.        Mobility  Bed Mobility Overal bed mobility: Independent Transfers Overall transfer level: Modified independent General transfer comment: pt needs UE A to complete transfers.   Ambulation/Gait Ambulation/Gait assistance: Modified independent (Device/Increase time) Ambulation Distance (Feet): 200 Feet Assistive device: None Gait Pattern/deviations: Step-through pattern;Decreased stride length;Trunk flexed General Gait Details: pt moves slowly and indicates feeling tired after being in ER yesterday and not much sleep last night.      Exercises     PT Diagnosis:    PT Problem List:   PT Treatment Interventions:       PT Goals(Current goals can be found in the care plan section)    Visit Information  Last PT Received On: 04/08/13 Assistance Needed: +1 History of Present Illness: pt presents post seizure with L sided weakness, which has now resolved.         Prior [place] expects to be  discharged to:: Private residence Living Arrangements: Children Available Help at Discharge: Family;Available PRN/intermittently Type of Home: House Home Access: Stairs to enter Entrance Stairs-Number of Steps: 1 Home Layout: One level Home Equipment: Walker - 2 wheels;Shower seat Additional Comments: Daughter works 2nd shift, but other family members can check in on pt while daughter is at work.   Prior Function Level of Independence: Needs assistance ADL's / Homemaking Assistance Needed: Daughter performs homemaking tasks and drives.   Communication Communication: No difficulties    Cognition  Cognition Arousal/Alertness: Awake/alert Behavior During Therapy: WFL for tasks assessed/performed Overall Cognitive Status: History of cognitive impairments - at baseline Memory: Decreased short-term memory    Extremity/Trunk Assessment Upper Extremity Assessment Upper Extremity Assessment: Overall WFL for tasks assessed Lower Extremity Assessment Lower Extremity Assessment: Overall WFL for tasks assessed   Balance Balance Overall balance assessment: Modified Independent  End of Session PT - End of Session Equipment Utilized During Treatment: Gait belt Activity Tolerance: Patient tolerated treatment well Patient left: in chair;with call bell/phone within reach Nurse Communication: Mobility status  GP     Catarina Hartshorn, [place] 04/08/2013, 8:29 AM

## 2013-04-08 NOTE — Progress Notes (Signed)
Discharge orders received, pt for discharge home today. IV and telemetry D/C,  D/C instructions and Rx given with verbalized understanding.  Family at bedside to assist pt with discharge. Staff brought pt downstairs via wheelchair.  

## 2013-04-08 NOTE — Discharge Instructions (Signed)
It was a pleasure taking care of you. - You weakness was likely a side effect of your seizure, which occurred after not taking your Keppra exactly as scheduled. - Please follow up with your neurologist, Dr. Merlene Laughter, as scheduled above. - Please also follow up with your primary care doctor, Dr. Buelah Manis, as scheduled above. - Going forward, we want you to take 750mg  of Keppra, your seizure medicine, twice a day. This is an increase from your prior dose. - If you develop recurrent seizure, weakness, numbness, chest pain, please return to the ED.

## 2013-04-08 NOTE — Discharge Summary (Signed)
  Date: 04/08/2013  Patient name: [name]  Medical record number: [id number]  Date of birth: [birth year]   This patient has been seen and the plan of care was discussed with the house staff. Please see their note for complete details. I concur with their findings and plan.  Dominic Pea, DO, [facility] 04/08/2013, 2:43 PM

## 2013-04-08 NOTE — Evaluation (Signed)
Occupational Therapy Evaluation Patient Details Name: [name] MRN: [id number] DOB: 02-01-1946 Today's Date: 04/08/2013 Time: 6010-9323 OT Time Calculation (min): 23 min  OT Assessment / Plan / Recommendation History of present illness Pt presents post seizure with L sided weakness, which has now resolved.     Clinical Impression   Pt presents w/ dx as above w/ resolution of symptoms. Pt appears at baseline level for ADL's, self care tasks and functional transfers at this time. She plans to d/c home later today w/ intermittent family assist, lives w/ daughter whom previously assisted w/ homemaking, meal prep and supervision for showers. No further acute OT needs at this time, will sign off.    OT Assessment  Patient does not need any further OT services    Follow Up Recommendations  No OT follow up;Supervision - Intermittent    Barriers to Discharge      Equipment Recommendations  None recommended by OT    Recommendations for Other Services    Frequency       Precautions / Restrictions Precautions Precautions: None Restrictions Weight Bearing Restrictions: No   Pertinent Vitals/Pain No c/o/denies pain.    ADL  Grooming: Performed;Wash/dry hands;Wash/dry face;Teeth care;Brushing hair;Modified independent Where Assessed - Grooming: Unsupported sitting;Unsupported standing Upper Body Bathing: Performed;Chest;Right arm;Left arm;Abdomen;Modified independent Where Assessed - Upper Body Bathing: Unsupported standing;Unsupported sit to stand Lower Body Bathing: Performed;Modified independent;Supervision/safety Where Assessed - Lower Body Bathing: Unsupported standing Upper Body Dressing: Performed;Modified independent Where Assessed - Upper Body Dressing: Unsupported standing Lower Body Dressing: Performed;Modified independent Where Assessed - Lower Body Dressing: Supported sit to Lobbyist: Modified independent;Performed Armed forces technical officer Method: Sit to Naval architect: Comfort height toilet Toileting - Clothing Manipulation and Hygiene: Performed;Modified independent Where Assessed - Toileting Clothing Manipulation and Hygiene: Sit to stand from 3-in-1 or toilet;Sit on 3-in-1 or toilet Tub/Shower Transfer Method: Not assessed Transfers/Ambulation Related to ADLs: Pt overall Mod I transfers & appears to be at baseline level. ADL Comments: Pt was seen for OT assessment & ADL re-training session. Pt is overall Mod I grooming, bathing and dressing & toilet transfers. Occasional supervision during LB dressing in standing, however pt states that she usually sits for LB dressing at home. Discussed homemaking, meal prep and ADL's. Pt has supportive family whom assist pt PRN. Discussed home safety and techniques w/ pt. Pt appears at baseline level & does not exhibit any further acute OT needs at this time.    OT Diagnosis:    OT Problem List:   OT Treatment Interventions:     OT Goals(Current goals can be found in the care plan section) Acute Rehab OT Goals Patient Stated Goal: D/c home w/ family/daughter assist later today.  Visit Information  Last OT Received On: 04/08/13 Assistance Needed: +1 History of Present Illness: pt presents post seizure with L sided weakness, which has now resolved.         Prior [place] expects to be discharged to:: Private residence Living Arrangements: Children Available Help at Discharge: Family;Available PRN/intermittently Type of Home: House Home Access: Stairs to enter Entrance Stairs-Number of Steps: 1 Home Layout: One level Home Equipment: Walker - 2 wheels;Shower seat Additional Comments: Daughter works 2nd shift, but other family members can check in on pt while daughter is at work.   Prior Function Level of Independence: Needs assistance ADL's / Homemaking Assistance Needed: Daughter performs homemaking tasks and drives.   Communication Communication: No  difficulties Dominant Hand:  Right    Vision/Perception Vision - Assessment Vision Assessment: Vision not tested   Cognition  Cognition Arousal/Alertness: Awake/alert Behavior During Therapy: WFL for tasks assessed/performed Overall Cognitive Status: History of cognitive impairments - at baseline Memory: Decreased short-term memory    Extremity/Trunk Assessment Upper Extremity Assessment Upper Extremity Assessment: Overall WFL for tasks assessed Lower Extremity Assessment Lower Extremity Assessment: Overall WFL for tasks assessed    Mobility Bed Mobility Overal bed mobility: Independent Transfers Overall transfer level: Modified independent        Balance Balance Overall balance assessment: Modified Independent   End of Session OT - End of Session Activity Tolerance: Patient tolerated treatment well Patient left: in chair;with call bell/phone within reach Nurse Communication: Mobility status;Other (comment) (ADL's completed)  GO     Almyra Deforest 04/08/2013, 12:07 PM

## 2013-04-08 NOTE — Progress Notes (Signed)
Subjective: No recurrence of seizure activity reported. Patient had no complaints.  Objective: Current vital signs: BP 175/77  Pulse 66  Temp(Src) 98 F (36.7 C) (Oral)  Resp 18  Ht 5\' 2"  (1.575 m)  Wt 65.862 kg (145 lb 3.2 oz)  BMI 26.55 kg/m2  SpO2 100%  Neurologic Exam: Alert and in no acute distress. Mental status was normal. Extraocular movements were full and conjugate. Visual fields were intact and normal. No facial weakness noted. Strength of extremities was normal and symmetrical except for minimal proximal left lower extremity weakness (likely residual from old stroke). Coordination was normal.  MRI of the brain on 04/07/2013 showed encephalomalacia from previous right cerebral infarction. There was no evidence of recurrent acute stroke.  Medications: I have reviewed the patient's current medications.  Assessment/Plan: Breakthrough partial seizure activity with confusion and focal motor activity, resolved. Patient is tolerating them increasing Keppra to 750 mg twice a day with no apparent side effects. MRI showed no signs of recurrent acute stroke.  Recommend continuing Keppra at current dose and followup with out patient neurology in the next 2-3 weeks. No further neurological intervention is indicated on an inpatient basis.  C.R. Nicole Kindred, MD [facility] [phone]  04/08/2013  10:41 AM

## 2013-04-08 NOTE — H&P (Signed)
INTERNAL MEDICINE TEACHING SERVICE Attending Admission Note  Date: 04/08/2013  Patient name: [name]  Medical record number: [id number]  Date of birth: 1946-03-28    I have seen and evaluated [name] and discussed their care with the Residency Team.  68 yr old female with hx CVA, seizure disorder, HTN, dementia, HL, presented with seizure activity. She admits to feeling numb/tingling in her LE that has resolved. She admits to being confused after the event.  This has since resolved. She admits to perhaps forgetting to take her Hyampom recently. She denies any hx of CP or SOB. In the ED, she was given Ativan due to concern for continued seizure activity. She was loaded with IV Keppra and admitted to r/o CVA. She was evaluated by Neurology who recommended increasing Keppra to 750 mg po bid and obtaining MRI brain. MRI subsequently found no evidence of new CVA.  At this time, she feels back at baseline. She is ambulating in the room and has no neurological complaints. Her exam shows evidence of slight proximal LLE weakness, unchanged from past CVA. She is medically stable for D/C.  Dominic Pea, DO, [place] [facility] 04/08/2013, 1:39 PM

## 2013-04-14 ENCOUNTER — Encounter: Payer: Self-pay | Admitting: Family Medicine

## 2013-04-14 ENCOUNTER — Ambulatory Visit (INDEPENDENT_AMBULATORY_CARE_PROVIDER_SITE_OTHER): Payer: Medicare Other | Admitting: Family Medicine

## 2013-04-14 VITALS — BP 130/90 | HR 68 | Temp 97.9°F | Resp 18 | Ht 61.0 in | Wt 151.0 lb

## 2013-04-14 DIAGNOSIS — F0150 Vascular dementia without behavioral disturbance: Secondary | ICD-10-CM | POA: Diagnosis not present

## 2013-04-14 DIAGNOSIS — Z23 Encounter for immunization: Secondary | ICD-10-CM | POA: Diagnosis not present

## 2013-04-14 DIAGNOSIS — R569 Unspecified convulsions: Secondary | ICD-10-CM | POA: Diagnosis not present

## 2013-04-14 DIAGNOSIS — I10 Essential (primary) hypertension: Secondary | ICD-10-CM | POA: Diagnosis not present

## 2013-04-14 DIAGNOSIS — E785 Hyperlipidemia, unspecified: Secondary | ICD-10-CM

## 2013-04-14 DIAGNOSIS — R4189 Other symptoms and signs involving cognitive functions and awareness: Secondary | ICD-10-CM

## 2013-04-14 DIAGNOSIS — F919 Conduct disorder, unspecified: Secondary | ICD-10-CM

## 2013-04-14 DIAGNOSIS — R4689 Other symptoms and signs involving appearance and behavior: Secondary | ICD-10-CM

## 2013-04-14 MED ORDER — DONEPEZIL HCL 5 MG PO TABS: 5.0000 mg | ORAL_TABLET | Freq: Every day | ORAL | Status: DC

## 2013-04-14 NOTE — Assessment & Plan Note (Signed)
LDL is at goal no change in medication

## 2013-04-14 NOTE — Patient Instructions (Signed)
Start the aricept 1 tablet at bedtime, this is for your memory Continue all other medications Pneumonia vaccine given Neurology apt- Dr. Merlene Laughter is tomorrow at 11:45 Change f/u to 6 weeks

## 2013-04-14 NOTE — Progress Notes (Signed)
   Subjective:    Patient ID: [name], female    DOB: Dec 27, 1945, 68 y.o.   MRN: [id number]  HPI  Pt here for hospital follow up. She was admitted secondary to an episode of breakthrough seizure. Her Keppra was increased to 750 mg twice a day and she's been taking this as directed. She states that she only meds one dose of her seizure medications before it started. She has no specific concerns today and states that she is feeling well.  Her daughter however has written me a note as she cannot get the visit concerned about her mother's behavior. We discussed some of this at her last visit where she had been blaming her daughter for selling money from her bank account. She continues to blame her daughter as well as her daughter's current facility money from her account even though the note states that her mother has a bank account and a completely different bank. She also notes that her mother hears people walking beneath the house and thought that the floor was shaking when she will walk up on it. She denies this. The note also suggested that her mother thought there was a pain in her head that was coming from the floors as well. She did not take the Aricept as we discussed at the last visit although she was given this in the hospital per the records. She denies feeling depressed or anxious.  Review of Systems   GEN- denies fatigue, fever, weight loss,weakness, recent illness HEENT- denies eye drainage, change in vision, nasal discharge, CVS- denies chest pain, palpitations RESP- denies SOB, cough, wheeze ABD- denies N/V, change in stools, abd pain GU- denies dysuria, hematuria, dribbling, incontinence MSK- denies joint pain, muscle aches, injury Neuro- denies headache, dizziness, syncope, seizure activity      Objective:   Physical Exam  GEN- NAD, alert and oriented x3 HEENT- PERRL, EOMI, non injected sclera, pink conjunctiva, MMM, oropharynx clear Neck- Supple, no bruit CVS- RRR, no  murmur RESP-CTAB EXT- No edema Pulses- Radial, DP- 2+ Psych- normal affect and mood, speech normal, good eye contact, no hallucinations, normal responses to questions, very polite         Assessment & Plan:

## 2013-04-14 NOTE — Assessment & Plan Note (Signed)
She will followup with neurology tomorrow I called and confirmed the appointment she will continue the [facility] discharge was reviewed

## 2013-04-14 NOTE — Assessment & Plan Note (Signed)
I find this to be a very interesting situation as during the examination in my communication with route she is very lucid and her thought process appears to be intact. However the notes that I received from her daughter contraindicate this. I think should she would benefit from Aricept. Unfortunately there is contrast issues between route and her daughter now regarding the finances which makes this very difficult. I will set her up with [facility] to have another set of eyes in the home

## 2013-04-14 NOTE — Assessment & Plan Note (Signed)
Well-controlled no change in medication

## 2013-04-14 NOTE — Assessment & Plan Note (Signed)
I discussed the importance of the medication and how it can help improve her memory and keep her from progressing. She agrees to start the Aricept 5 mg at bedtime.

## 2013-04-22 ENCOUNTER — Telehealth: Payer: Self-pay | Admitting: *Deleted

## 2013-04-22 NOTE — Telephone Encounter (Signed)
Sent in PA for pt to receive her Nexium 40mg , waiting on PA for approval

## 2013-04-24 ENCOUNTER — Other Ambulatory Visit: Payer: Self-pay | Admitting: Family Medicine

## 2013-04-30 ENCOUNTER — Ambulatory Visit: Payer: Medicare Other | Admitting: Family Medicine

## 2013-05-17 NOTE — Telephone Encounter (Signed)
Patient is aware 

## 2013-05-26 ENCOUNTER — Ambulatory Visit: Payer: Medicare Other | Admitting: Family Medicine

## 2013-08-08 ENCOUNTER — Other Ambulatory Visit: Payer: Self-pay | Admitting: Family Medicine

## 2013-08-09 NOTE — Telephone Encounter (Signed)
Refill appropriate and filled per protocol. 

## 2013-08-10 ENCOUNTER — Ambulatory Visit: Payer: Self-pay | Admitting: Family Medicine

## 2013-08-11 ENCOUNTER — Other Ambulatory Visit (HOSPITAL_COMMUNITY): Payer: Self-pay | Admitting: Internal Medicine

## 2013-08-11 ENCOUNTER — Other Ambulatory Visit: Payer: Self-pay | Admitting: *Deleted

## 2013-08-11 MED ORDER — LEVETIRACETAM 750 MG PO TABS: 750.0000 mg | ORAL_TABLET | Freq: Two times a day (BID) | ORAL | Status: DC

## 2013-08-11 NOTE — Telephone Encounter (Signed)
Refill appropriate and filled per protocol. 

## 2013-11-06 ENCOUNTER — Other Ambulatory Visit: Payer: Self-pay | Admitting: Family Medicine

## 2013-11-08 NOTE — Telephone Encounter (Signed)
Medication filled x1 with no refills.   Requires office visit before any further refills can be given.   Letter sent.  

## 2013-12-11 ENCOUNTER — Other Ambulatory Visit: Payer: Self-pay | Admitting: Family Medicine

## 2013-12-13 ENCOUNTER — Telehealth: Payer: Self-pay | Admitting: Family Medicine

## 2013-12-13 NOTE — Telephone Encounter (Signed)
Patient requires F/U visit.   Call placed to patient. [name].

## 2013-12-13 NOTE — Telephone Encounter (Signed)
Constance poindexter calling to trying to get rx refill for Uva's seizure medication please call her back at [phone]   She has been out of this for two days [facility]

## 2013-12-14 MED ORDER — LEVETIRACETAM 750 MG PO TABS: 750.0000 mg | ORAL_TABLET | Freq: Two times a day (BID) | ORAL | Status: DC

## 2013-12-14 NOTE — Telephone Encounter (Signed)
Received fax requesting refill on Keppra.   Refill sent in to pharmacy.   Call placed to patient daughter, Roland Rack.   Reports that patient is trying to change doctors to someone she does not know.   Advised that if she chooses to change providers, then the new provider can prescribe further refills. If patient chooses to stay with MD as PCP, then OV will be needed.   Verbalized understanding.

## 2014-01-11 ENCOUNTER — Telehealth: Payer: Self-pay | Admitting: Family Medicine

## 2014-01-14 NOTE — Telephone Encounter (Signed)
Patient aware we are into January for new patient appointments. Appointment given for 1/21 with [facility]

## 2014-02-17 ENCOUNTER — Other Ambulatory Visit: Payer: Self-pay | Admitting: Family Medicine

## 2014-02-17 NOTE — Telephone Encounter (Signed)
Refill denied.   Requires office visit before any further refills can be given.   Letter sent.

## 2014-02-21 ENCOUNTER — Other Ambulatory Visit: Payer: Self-pay | Admitting: *Deleted

## 2014-02-21 MED ORDER — ENALAPRIL MALEATE 5 MG PO TABS: 5.0000 mg | ORAL_TABLET | Freq: Two times a day (BID) | ORAL | Status: DC

## 2014-02-22 DIAGNOSIS — I10 Essential (primary) hypertension: Secondary | ICD-10-CM | POA: Diagnosis not present

## 2014-03-01 ENCOUNTER — Encounter (HOSPITAL_COMMUNITY): Payer: Self-pay | Admitting: *Deleted

## 2014-03-01 ENCOUNTER — Inpatient Hospital Stay (HOSPITAL_COMMUNITY)
Admission: EM | Admit: 2014-03-01 | Discharge: 2014-03-01 | DRG: 101 | Disposition: A | Discharge status: Home / Self Care | Payer: Medicare Other | Attending: Internal Medicine | Admitting: Internal Medicine

## 2014-03-01 ENCOUNTER — Emergency Department (HOSPITAL_COMMUNITY): Payer: Medicare Other

## 2014-03-01 DIAGNOSIS — Z79899 Other long term (current) drug therapy: Secondary | ICD-10-CM | POA: Diagnosis not present

## 2014-03-01 DIAGNOSIS — G40909 Epilepsy, unspecified, not intractable, without status epilepticus: Principal | ICD-10-CM | POA: Diagnosis present

## 2014-03-01 DIAGNOSIS — R569 Unspecified convulsions: Secondary | ICD-10-CM | POA: Diagnosis not present

## 2014-03-01 DIAGNOSIS — K219 Gastro-esophageal reflux disease without esophagitis: Secondary | ICD-10-CM | POA: Diagnosis present

## 2014-03-01 DIAGNOSIS — I10 Essential (primary) hypertension: Secondary | ICD-10-CM | POA: Diagnosis present

## 2014-03-01 DIAGNOSIS — Z7982 Long term (current) use of aspirin: Secondary | ICD-10-CM | POA: Diagnosis not present

## 2014-03-01 DIAGNOSIS — Z8673 Personal history of transient ischemic attack (TIA), and cerebral infarction without residual deficits: Secondary | ICD-10-CM

## 2014-03-01 DIAGNOSIS — I639 Cerebral infarction, unspecified: Secondary | ICD-10-CM | POA: Diagnosis not present

## 2014-03-01 DIAGNOSIS — F0150 Vascular dementia without behavioral disturbance: Secondary | ICD-10-CM | POA: Diagnosis present

## 2014-03-01 DIAGNOSIS — E785 Hyperlipidemia, unspecified: Secondary | ICD-10-CM | POA: Diagnosis present

## 2014-03-01 DIAGNOSIS — R55 Syncope and collapse: Secondary | ICD-10-CM | POA: Diagnosis not present

## 2014-03-01 DIAGNOSIS — Z87891 Personal history of nicotine dependence: Secondary | ICD-10-CM | POA: Diagnosis not present

## 2014-03-01 LAB — COMPREHENSIVE METABOLIC PANEL
ALK PHOS: 86 U/L (ref 39–117)
ALT: 12 U/L (ref 0–35)
AST: 16 U/L (ref 0–37)
Albumin: 3.9 g/dL (ref 3.5–5.2)
Anion gap: 17 — ABNORMAL HIGH (ref 5–15)
BUN: 14 mg/dL (ref 6–23)
CALCIUM: 9.4 mg/dL (ref 8.4–10.5)
CO2: 23 mEq/L (ref 19–32)
Chloride: 103 mEq/L (ref 96–112)
Creatinine, Ser: 0.93 mg/dL (ref 0.50–1.10)
GLUCOSE: 151 mg/dL — AB (ref 70–99)
Potassium: 3.6 mEq/L — ABNORMAL LOW (ref 3.7–5.3)
Sodium: 143 mEq/L (ref 137–147)
TOTAL PROTEIN: 7.2 g/dL (ref 6.0–8.3)
Total Bilirubin: 0.2 mg/dL — ABNORMAL LOW (ref 0.3–1.2)

## 2014-03-01 LAB — CBC WITH DIFFERENTIAL/PLATELET
Basophils Absolute: 0 10*3/uL (ref 0.0–0.1)
Basophils Relative: 0 % (ref 0–1)
EOS ABS: 0 10*3/uL (ref 0.0–0.7)
EOS PCT: 0 % (ref 0–5)
HEMATOCRIT: 35.9 % — AB (ref 36.0–46.0)
Hemoglobin: 12 g/dL (ref 12.0–15.0)
Lymphocytes Relative: 17 % (ref 12–46)
Lymphs Abs: 1.3 10*3/uL (ref 0.7–4.0)
MCH: 28.7 pg (ref 26.0–34.0)
MCHC: 33.4 g/dL (ref 30.0–36.0)
MCV: 85.9 fL (ref 78.0–100.0)
Monocytes Absolute: 0.4 10*3/uL (ref 0.1–1.0)
Monocytes Relative: 5 % (ref 3–12)
Neutro Abs: 6.1 10*3/uL (ref 1.7–7.7)
Neutrophils Relative %: 78 % — ABNORMAL HIGH (ref 43–77)
Platelets: 270 10*3/uL (ref 150–400)
RBC: 4.18 MIL/uL (ref 3.87–5.11)
RDW: 13.4 % (ref 11.5–15.5)
WBC: 7.9 10*3/uL (ref 4.0–10.5)

## 2014-03-01 LAB — TROPONIN I: Troponin I: <0.3 ng/mL (ref <0.30)

## 2014-03-01 LAB — URINALYSIS, ROUTINE W REFLEX MICROSCOPIC
BILIRUBIN URINE: NEGATIVE
Glucose, UA: NEGATIVE mg/dL
Ketones, ur: NEGATIVE mg/dL
Leukocytes, UA: NEGATIVE
NITRITE: NEGATIVE
PROTEIN: NEGATIVE mg/dL
Specific Gravity, Urine: 1.02 (ref 1.005–1.030)
UROBILINOGEN UA: 0.2 mg/dL (ref 0.0–1.0)
pH: 5.5 (ref 5.0–8.0)

## 2014-03-01 LAB — URINE MICROSCOPIC-ADD ON

## 2014-03-01 MED ORDER — LORAZEPAM 2 MG/ML IJ SOLN
1.0000 mg | Freq: Once | INTRAMUSCULAR | Status: AC
  Administered 2014-03-01: 1 mg via INTRAVENOUS
  Filled 2014-03-01: qty 1

## 2014-03-01 MED ORDER — LEVETIRACETAM IN NACL 1000 MG/100ML IV SOLN
1000.0000 mg | Freq: Once | INTRAVENOUS | Status: AC
  Administered 2014-03-01: 1000 mg via INTRAVENOUS
  Filled 2014-03-01: qty 100

## 2014-03-01 MED ORDER — SODIUM CHLORIDE 0.9 % IV BOLUS (SEPSIS)
500.0000 mL | Freq: Once | INTRAVENOUS | Status: AC
  Administered 2014-03-01: 500 mL via INTRAVENOUS

## 2014-03-01 NOTE — ED Notes (Signed)
Pt transported to CT upon arrival

## 2014-03-01 NOTE — ED Notes (Signed)
Patients daughter at desk to inform me that mother has been weaning herself off of Keppra because she does not feel she needs it anymore. EDP informed of this information also by daughter.

## 2014-03-01 NOTE — ED Notes (Signed)
SOC assessment finished.

## 2014-03-01 NOTE — Discharge Instructions (Signed)
Follow-up with your primary Dr. in the next few days for a recheck, and return to the ER if you experience any new or concerning symptoms.   Syncope Syncope is a medical term for fainting or passing out. This means you lose consciousness and drop to the ground. People are generally unconscious for less than 5 minutes. You may have some muscle twitches for up to 15 seconds before waking up and returning to normal. Syncope occurs more often in older adults, but it can happen to anyone. While most causes of syncope are not dangerous, syncope can be a sign of a serious medical problem. It is important to seek medical care.  CAUSES  Syncope is caused by a sudden drop in blood flow to the brain. The specific cause is often not determined. Factors that can bring on syncope include:  Taking medicines that lower blood pressure.  Sudden changes in posture, such as standing up quickly.  Taking more medicine than prescribed.  Standing in one place for too long.  Seizure disorders.  Dehydration and excessive exposure to heat.  Low blood sugar (hypoglycemia).  Straining to have a bowel movement.  Heart disease, irregular heartbeat, or other circulatory problems.  Fear, emotional distress, seeing blood, or severe pain. SYMPTOMS  Right before fainting, you may:  Feel dizzy or light-headed.  Feel nauseous.  See all white or all black in your field of vision.  Have cold, clammy skin. DIAGNOSIS  Your health care provider will ask about your symptoms, perform a physical exam, and perform an electrocardiogram (ECG) to record the electrical activity of your heart. Your health care provider may also perform other heart or blood tests to determine the cause of your syncope which may include:  Transthoracic echocardiogram (TTE). During echocardiography, sound waves are used to evaluate how blood flows through your heart.  Transesophageal echocardiogram (TEE).  Cardiac monitoring. This allows your  health care provider to monitor your heart rate and rhythm in real time.  Holter monitor. This is a portable device that records your heartbeat and can help diagnose heart arrhythmias. It allows your health care provider to track your heart activity for several days, if needed.  Stress tests by exercise or by giving medicine that makes the heart beat faster. TREATMENT  In most cases, no treatment is needed. Depending on the cause of your syncope, your health care provider may recommend changing or stopping some of your medicines. HOME CARE INSTRUCTIONS  Have someone stay with you until you feel stable.  Do not drive, use machinery, or play sports until your health care provider says it is okay.  Keep all follow-up appointments as directed by your health care provider.  Lie down right away if you start feeling like you might faint. Breathe deeply and steadily. Wait until all the symptoms have passed.  Drink enough fluids to keep your urine clear or pale yellow.  If you are taking blood pressure or heart medicine, get up slowly and take several minutes to sit and then stand. This can reduce dizziness. SEEK IMMEDIATE MEDICAL CARE IF:   You have a severe headache.  You have unusual pain in the chest, abdomen, or back.  You are bleeding from your mouth or rectum, or you have black or tarry stool.  You have an irregular or very fast heartbeat.  You have pain with breathing.  You have repeated fainting or seizure-like jerking during an episode.  You faint when sitting or lying down.  You have confusion.  You have trouble walking.  You have severe weakness.  You have vision problems. If you fainted, call your local emergency services (911 in [place] Do not drive yourself to the hospital.  MAKE SURE YOU:  Understand these instructions.  Will watch your condition.  Will get help right away if you are not doing well or get worse. Document Released: 03/18/2005 Document Revised:  03/23/2013 Document Reviewed: 05/17/2011 [facility] Patient Information 2015 [place], [place]. This information is not intended to replace advice given to you by your health care provider. Make sure you discuss any questions you have with your health care provider.

## 2014-03-01 NOTE — ED Provider Notes (Signed)
CSN: [id number]     Arrival date & time 03/01/14  1224 History  This chart was scribed for Maudry Diego, MD by Rayfield Citizen, ED Scribe. This patient was seen in room APA01/APA01 and the patient's care was started at 12:38 PM.    Chief Complaint  Patient presents with  . ?stroke/seizure    Patient is a 68 y.o. female presenting with seizures. The history is provided by the patient and the EMS personnel (pt complains of stroke/seizure). No language interpreter was used.  Seizures Seizure activity on arrival: yes   Seizure type:  Unable to specify Initial focality:  Unable to specify Episode characteristics comment:  Unsure/unwitnessed Postictal symptoms: confusion and memory loss   Severity:  Unable to specify Timing:  Unable to specify Progression:  Unable to specify History of seizures: yes      HPI Comments: [name] is a 68 y.o. female with past medical history of seizures and stroke who presents to the Emergency Department complaining of a potential stroke/seizure. Patient reports that she woke this morning at 0600 to make coffee and remembers having two cups; she is unsure of what may have transpired between that time and when she found herself on the floor next to the couch later this morning. She believes she may have fallen off of the couch approximately an hour before EMS arrived; she is unsure of any LOC.   Patient currently takes Jarratt for seizures. She has only taken one dose of Keppra today; she normally takes one dose in the morning, one in the afternoon.   Past Medical History  Diagnosis Date  . Hypertension   . GERD (gastroesophageal reflux disease)   . Stroke May 2013  . Hyperlipidemia   . Dementia, vascular   . Seizure disorder    Past Surgical History  Procedure Laterality Date  . Bunionectomy    . Colonoscopy  10/31/2011     LARGE Internal hemorrhoids, S/P BANDING/ ADENOMATOUS APPEARING Polyp in the ascending colon/ multiple polyps in the  descending,  sigmoid   colon and rectumSIMPLE ADENOMAS, SURVEILLANCE 2023  . Esophagogastroduodenoscopy  10/31/2011    LARGE Hiatal hernia/Mild gastritis  . Hemorrhoid surgery  01/31/2012    Procedure: HEMORRHOIDECTOMY;  Surgeon: Jamesetta So, MD;  Location: [facility];  Service: General;  Laterality: N/A;   Family History  Problem Relation Age of Onset  . Stroke Mother   . Diabetes Mother   . Hypertension Mother   . Hyperlipidemia Mother   . Hypertension Father   . Hyperlipidemia Father   . Colon cancer Neg Hx    History  Substance Use Topics  . Smoking status: Former Smoker -- 1.00 packs/day for 50 years    Types: Cigarettes  . Smokeless tobacco: Never Used     Comment: May of 2013  . Alcohol Use: No   OB History    No data available     Review of Systems  Constitutional: Negative for appetite change and fatigue.  HENT: Negative for congestion, ear discharge and sinus pressure.   Eyes: Negative for discharge.  Respiratory: Negative for cough.   Cardiovascular: Negative for chest pain.  Gastrointestinal: Negative for abdominal pain and diarrhea.  Genitourinary: Negative for frequency and hematuria.  Musculoskeletal: Negative for back pain.  Skin: Negative for rash.  Neurological: Positive for tremors and weakness. Negative for seizures and headaches.  Psychiatric/Behavioral: Negative for hallucinations.    Allergies  Review of patient's allergies indicates no known allergies.  Home Medications  Prior to Admission medications   Medication Sig Start Date End Date Taking? Authorizing Provider  acetaminophen (TYLENOL) 500 MG tablet Take 1,000 mg by mouth every 6 (six) hours as needed. Pain.    Historical Provider, MD  aspirin 325 MG EC tablet Take 325 mg by mouth daily.    Historical Provider, MD  calcium carbonate (TUMS) 500 MG chewable tablet Chew 1 tablet by mouth 3 (three) times daily as needed for indigestion or heartburn.    Historical Provider, MD  donepezil (ARICEPT) 5 MG  tablet Take 1 tablet (5 mg total) by mouth at bedtime. 04/14/13   Alycia Rossetti, MD  enalapril (VASOTEC) 5 MG tablet Take 1 tablet (5 mg total) by mouth 2 (two) times daily. 02/21/14   Sharion Balloon, FNP  esomeprazole (NEXIUM) 40 MG capsule Take 40 mg by mouth daily at 12 noon.    Historical Provider, MD  levETIRAcetam (KEPPRA) 750 MG tablet Take 1 tablet (750 mg total) by mouth 2 (two) times daily. 12/14/13   Alycia Rossetti, MD  omeprazole (PRILOSEC) 20 MG capsule Take 20 mg by mouth daily.    Historical Provider, MD  pantoprazole (PROTONIX) 40 MG tablet Take 1 tablet (40 mg total) by mouth daily. 04/07/13   Alycia Rossetti, MD  simvastatin (ZOCOR) 20 MG tablet TAKE ONE TABLET BY MOUTH ONCE DAILY 6:00  PM.    Alycia Rossetti, MD   There were no vitals taken for this visit. Physical Exam  Constitutional: She is oriented to person, place, and time. She appears well-developed.  HENT:  Head: Normocephalic.  Eyes: Conjunctivae and EOM are normal. No scleral icterus.  Neck: Neck supple. No tracheal deviation present. No thyromegaly present.  Cardiovascular: Normal rate and regular rhythm.  Exam reveals no gallop and no friction rub.   No murmur heard. Pulmonary/Chest: No stridor. She has no wheezes. She has no rales. She exhibits no tenderness.  Abdominal: She exhibits no distension. There is no tenderness. There is no rebound.  Musculoskeletal: Normal range of motion. She exhibits no edema.  Lymphadenopathy:    She has no cervical adenopathy.  Neurological: She is oriented to person, place, and time. She exhibits normal muscle tone. Coordination normal.  Seizure activity in left leg; weakness in left arm with some seizure activity there as well  Skin: Skin is warm. No rash noted. No erythema.  Psychiatric: She has a normal mood and affect. Her behavior is normal.  Nursing note and vitals reviewed.   ED Course  Procedures   DIAGNOSTIC STUDIES:   COORDINATION OF CARE: 12:42 PM  Discussed treatment plan with pt at bedside and pt agreed to plan.   Labs Review Labs Reviewed  CBC WITH DIFFERENTIAL  COMPREHENSIVE METABOLIC PANEL  URINALYSIS, ROUTINE W REFLEX MICROSCOPIC  TROPONIN I    Imaging Review No results found.   EKG Interpretation None      MDM   Final diagnoses:  Stroke   The chart was scribed for me under my direct supervision.  I personally performed the history, physical, and medical decision making and all procedures in the evaluation of this patient.Maudry Diego, MD 03/08/14 2052

## 2014-03-01 NOTE — ED Notes (Addendum)
Lab called to report creatinine is 0.93 not <0.20 EDP informed.

## 2014-03-01 NOTE — ED Notes (Signed)
SOC talking with patient now.

## 2014-03-01 NOTE — ED Notes (Signed)
Labs being drawn 

## 2014-03-01 NOTE — ED Notes (Signed)
Patient called daughter to report she slid off couch, EMS called out by neighbor; EMS arrived to find patient alert to self and having left sided tremors, patient does not know what time she fell or how long she had been on floor before calling daughter, patient remembers getting up at 0600 and having two cups of coffee but that is all she remembers. Patient alert and talking to EDP at this time.

## 2014-03-01 NOTE — ED Notes (Signed)
Monitor set up for [facility].

## 2014-03-01 NOTE — ED Notes (Signed)
Family at bedside. 

## 2014-03-01 NOTE — ED Notes (Signed)
Patient has been kept NPO.

## 2014-03-01 NOTE — Discharge Summary (Signed)
Physician Discharge Summary  [name] XTG:[id number] DOB: 1945/12/02 DOA: 03/01/2014  PCP: Vic Blackbird, MD  Admit date: 03/01/2014 Discharge date: 03/01/2014  Time spent: 40 minutes minutes  Recommendations for Outpatient Follow-up:  1. Follow with primary care physician. Recommend Keppra levels be checked.   Discharge Diagnoses:  1. Seizure  Discharge Condition: Stable.  Diet recommendation: Regular.  There were no vitals filed for this visit.  History of present illness:  This is a very pleasant 68 year old lady who has a history of seizure disorder. Her last seizure was one year ago until this morning at 7 AM when she had another seizure. The features of the seizure were very similar to what she has had before. She felt unwell and apparently lost consciousness for a few minutes. She was postictal and remained postictal for a few hours. When she came to the emergency room here at around noon, within 20 minutes, she was back to her baseline. She is very keen to go home now and feels that she will do well. She denies any triggering factors such as fever, any infection or stressors, that might have triggered her seizure. She has been compliant with all medications that she takes.  Hospital Course:  As above. She is going to be discharged home with strong recommendation to follow-up with her primary care physician within the next few days. I would recommend that her Keppra level be checked and if it is extremely low, then this will need to be adjusted.  Procedures:  None. (i.e. Studies not automatically included, echos, thoracentesis, etc; not x-rays)  Consultations:  None.  Discharge Exam: Filed Vitals:   03/01/14 1630  BP: 141/91  Pulse: 77  Temp:   Resp: 21    General: She looks systemically well. She is alert and oriented. There are no focal neurological signs whatsoever. Cardiovascular: Heart sounds are present without murmurs or added sounds. Respiratory: Lung  fields are clear.  Discharge Instructions You were cared for by a hospitalist during your hospital stay. If you have any questions about your discharge medications or the care you received while you were in the hospital after you are discharged, you can call the unit and asked to speak with the hospitalist on call if the hospitalist that took care of you is not available. Once you are discharged, your primary care physician will handle any further medical issues. Please note that NO REFILLS for any discharge medications will be authorized once you are discharged, as it is imperative that you return to your primary care physician (or establish a relationship with a primary care physician if you do not have one) for your aftercare needs so that they can reassess your need for medications and monitor your lab values.  Discharge Instructions    Diet - low sodium heart healthy    Complete by:  As directed      Increase activity slowly    Complete by:  As directed           Current Discharge Medication List    CONTINUE these medications which have NOT CHANGED   Details  acetaminophen (TYLENOL) 500 MG tablet Take 1,000 mg by mouth every 6 (six) hours as needed. Pain.    aspirin 325 MG EC tablet Take 325 mg by mouth daily.    calcium carbonate (TUMS) 500 MG chewable tablet Chew 1 tablet by mouth 3 (three) times daily as needed for indigestion or heartburn.    enalapril (VASOTEC) 5 MG tablet Take 1 tablet (  5 mg total) by mouth 2 (two) times daily. Qty: 180 tablet, Refills: 0    esomeprazole (NEXIUM) 40 MG capsule Take 40 mg by mouth daily at 12 noon.    levETIRAcetam (KEPPRA) 750 MG tablet Take 1 tablet (750 mg total) by mouth 2 (two) times daily. Qty: 60 tablet, Refills: 3    pantoprazole (PROTONIX) 40 MG tablet Take 1 tablet (40 mg total) by mouth daily. Qty: 30 tablet, Refills: 3    simvastatin (ZOCOR) 20 MG tablet TAKE ONE TABLET BY MOUTH ONCE DAILY 6:00  PM. Qty: 90 tablet, Refills: 0        No Known Allergies    The results of significant diagnostics from this hospitalization (including imaging, microbiology, ancillary and laboratory) are listed below for reference.    Significant Diagnostic Studies: Ct Head Wo Contrast  03/01/2014   CLINICAL DATA:  Acute stroke.  EXAM: CT HEAD WITHOUT CONTRAST  TECHNIQUE: Contiguous axial images were obtained from the base of the skull through the vertex without intravenous contrast.  COMPARISON:  CT scan dated 04/07/2013  FINDINGS: There is extensive motion artifact although we repeated the study. There is no acute intracranial hemorrhage or infarction or mass lesion. There is extensive encephalomalacia in the right posterior temporal and parietal region from a remote infarct, unchanged. There are also small old right cerebellar hemisphere infarcts. There is no ventricular dilatation. No osseous abnormality.  IMPRESSION: No acute intracranial abnormalities. Old right parietal infarct. Old right cerebellar infarcts.   Electronically Signed   By: Rozetta Nunnery M.D.   On: 03/01/2014 12:46   Dg Chest Portable 1 View  03/01/2014   CLINICAL DATA:  Syncope, history of seizures  EXAM: PORTABLE CHEST - 1 VIEW  COMPARISON:  04/07/2013  FINDINGS: Cardiomediastinal silhouette is stable. No acute infiltrate or pleural effusion. No pulmonary edema. Mild degenerative changes bilateral shoulders.  IMPRESSION: No active disease.   Electronically Signed   By: Lahoma Crocker M.D.   On: 03/01/2014 13:19    Microbiology: No results found for this or any previous visit (from the past 240 hour(s)).   Labs: Basic Metabolic Panel:  Recent Labs Lab 03/01/14 1251  NA 143  K 3.6*  CL 103  CO2 23  GLUCOSE 151*  BUN 14  CREATININE 0.93  CALCIUM 9.4   Liver Function Tests:  Recent Labs Lab 03/01/14 1251  AST 16  ALT 12  ALKPHOS 86  BILITOT 0.2*  PROT 7.2  ALBUMIN 3.9   No results for input(s): LIPASE, AMYLASE in the last 168 hours. No results for  input(s): AMMONIA in the last 168 hours. CBC:  Recent Labs Lab 03/01/14 1251  WBC 7.9  NEUTROABS 6.1  HGB 12.0  HCT 35.9*  MCV 85.9  PLT 270   Cardiac Enzymes:  Recent Labs Lab 03/01/14 1251  TROPONINI <0.30   BNP: BNP (last 3 results) No results for input(s): PROBNP in the last 8760 hours. CBG: No results for input(s): GLUCAP in the last 168 hours.     SignedDoree Albee  [facility] 03/01/2014, 6:05 PM

## 2014-03-01 NOTE — ED Notes (Signed)
MD at bedside. Patient back from CT.

## 2014-03-29 ENCOUNTER — Telehealth: Payer: Self-pay | Admitting: Family Medicine

## 2014-03-29 NOTE — Telephone Encounter (Signed)
Pharmacy calling.  Pt switching Keppra to them.  They use a different manufacturer.  Need OK to use.  Approval given.

## 2014-03-29 NOTE — Telephone Encounter (Signed)
noted 

## 2014-04-21 ENCOUNTER — Ambulatory Visit: Payer: Medicare Other | Admitting: Family

## 2014-05-02 DIAGNOSIS — J40 Bronchitis, not specified as acute or chronic: Secondary | ICD-10-CM | POA: Diagnosis not present

## 2014-05-03 ENCOUNTER — Other Ambulatory Visit (HOSPITAL_COMMUNITY): Payer: Self-pay | Admitting: Preventative Medicine

## 2014-05-03 DIAGNOSIS — J984 Other disorders of lung: Secondary | ICD-10-CM

## 2014-05-09 ENCOUNTER — Ambulatory Visit (HOSPITAL_COMMUNITY)
Admission: RE | Admit: 2014-05-09 | Discharge: 2014-05-09 | Disposition: A | Discharge status: Home / Self Care | Payer: Medicare Other | Source: Ambulatory Visit | Attending: Preventative Medicine | Admitting: Preventative Medicine

## 2014-05-09 ENCOUNTER — Encounter (HOSPITAL_COMMUNITY): Payer: Self-pay

## 2014-05-09 DIAGNOSIS — R0989 Other specified symptoms and signs involving the circulatory and respiratory systems: Secondary | ICD-10-CM | POA: Insufficient documentation

## 2014-05-09 DIAGNOSIS — R05 Cough: Secondary | ICD-10-CM | POA: Insufficient documentation

## 2014-05-09 DIAGNOSIS — J90 Pleural effusion, not elsewhere classified: Secondary | ICD-10-CM | POA: Diagnosis not present

## 2014-05-09 DIAGNOSIS — J984 Other disorders of lung: Secondary | ICD-10-CM | POA: Diagnosis present

## 2014-05-09 LAB — POCT I-STAT CREATININE: Creatinine, Ser: 0.9 mg/dL (ref 0.50–1.10)

## 2014-05-09 MED ORDER — IOHEXOL 300 MG/ML  SOLN
80.0000 mL | Freq: Once | INTRAMUSCULAR | Status: AC | PRN
  Administered 2014-05-09: 80 mL via INTRAVENOUS

## 2014-05-10 DIAGNOSIS — M999 Biomechanical lesion, unspecified: Secondary | ICD-10-CM | POA: Diagnosis not present

## 2014-05-10 DIAGNOSIS — R918 Other nonspecific abnormal finding of lung field: Secondary | ICD-10-CM | POA: Diagnosis not present

## 2014-05-30 ENCOUNTER — Ambulatory Visit (INDEPENDENT_AMBULATORY_CARE_PROVIDER_SITE_OTHER): Payer: Medicare Other | Admitting: Family Medicine

## 2014-05-30 ENCOUNTER — Encounter: Payer: Self-pay | Admitting: Family Medicine

## 2014-05-30 VITALS — BP 198/91 | HR 84 | Temp 98.2°F | Ht 61.0 in | Wt 154.8 lb

## 2014-05-30 DIAGNOSIS — M6289 Other specified disorders of muscle: Secondary | ICD-10-CM | POA: Diagnosis not present

## 2014-05-30 DIAGNOSIS — K2100 Gastro-esophageal reflux disease with esophagitis, without bleeding: Secondary | ICD-10-CM

## 2014-05-30 DIAGNOSIS — I10 Essential (primary) hypertension: Secondary | ICD-10-CM | POA: Diagnosis not present

## 2014-05-30 DIAGNOSIS — Z8673 Personal history of transient ischemic attack (TIA), and cerebral infarction without residual deficits: Secondary | ICD-10-CM

## 2014-05-30 DIAGNOSIS — R569 Unspecified convulsions: Secondary | ICD-10-CM

## 2014-05-30 DIAGNOSIS — E785 Hyperlipidemia, unspecified: Secondary | ICD-10-CM

## 2014-05-30 DIAGNOSIS — R531 Weakness: Secondary | ICD-10-CM

## 2014-05-30 DIAGNOSIS — K279 Peptic ulcer, site unspecified, unspecified as acute or chronic, without hemorrhage or perforation: Secondary | ICD-10-CM | POA: Insufficient documentation

## 2014-05-30 MED ORDER — ESOMEPRAZOLE MAGNESIUM 40 MG PO CPDR: 40.0000 mg | DELAYED_RELEASE_CAPSULE | Freq: Every day | ORAL | Status: DC

## 2014-05-30 MED ORDER — LISINOPRIL-HYDROCHLOROTHIAZIDE 20-12.5 MG PO TABS: 1.0000 | ORAL_TABLET | Freq: Every day | ORAL | Status: DC

## 2014-05-30 MED ORDER — SIMVASTATIN 20 MG PO TABS: 20.0000 mg | ORAL_TABLET | Freq: Every day | ORAL | Status: DC

## 2014-05-30 MED ORDER — LEVETIRACETAM 750 MG PO TABS: 750.0000 mg | ORAL_TABLET | Freq: Two times a day (BID) | ORAL | Status: DC

## 2014-05-30 NOTE — Patient Instructions (Signed)
Resume your Keppra also known as levetiracetam as follows. You cannot start with the full dose since you have been off of it for 3 weeks. Therefore start with one half tablet every morning and one half every evening for 1 week. Then take one half every morning and one whole tablet every evening for 1 week. At the beginning of the following week, that would be the third week, you can resume the full dose of 1 tablet twice daily.

## 2014-05-30 NOTE — Progress Notes (Signed)
Subjective:  Patient ID: [name], female    DOB: March 20, 1946  Age: 69 y.o. MRN: [id number]  CC: Establish Care and Hypertension   HPI [name] presents forPatient in for follow-up of hypertension. Patient has no history of headache chest pain or shortness of breath or recent cough. Patient also denies recent symptoms of TIA such as numbness weakness lateralizing. She had a stroke several years ago related to elevated blood pressure etc. but has regained virtually all of her left-sided function with the exception of mild weakness of the left forearm. Patient checks  blood pressure at home and has not had any elevated readings recently. Patient denies side effects from his medication. States taking it regularly till it ran out approximately 3 weeks ago.  Patient in for follow-up of GERD. Currently asymptomatic taking  PPI daily. There is no chest pain or heartburn. No hematemesis and no melena. No dysphagia or choking. Onset is remote. Progression is stable. Complicating factors, none. Patient in for follow-up of elevated cholesterol. Doing well without complaints on current medication, Although she has not taken it for several weeks. Denies side effects of statin including myalgia and arthralgia and nausea. Also in today for liver function testing. Currently no chest pain, shortness of breath or other cardiovascular related symptoms noted.  She is also followed for seizure disorder for which she takes Keppra. She denies having had a seizure for approximately a year. She continues to take the medication tolerating well without weakness or somnolence.Marland Kitchen  History [name] has a past medical history of Hypertension; GERD (gastroesophageal reflux disease); Stroke (May 2013); Hyperlipidemia; Dementia, vascular; and Seizure disorder.   She has past surgical history that includes Bunionectomy; Colonoscopy (10/31/2011); Esophagogastroduodenoscopy (10/31/2011); and Hemorrhoid surgery (01/31/2012).   Her  family history includes Diabetes in her mother; Hyperlipidemia in her father and mother; Hypertension in her father and mother; Stroke in her mother. There is no history of Colon cancer.She reports that she has quit smoking. Her smoking use included Cigarettes. She has a 50 pack-year smoking history. She has never used smokeless tobacco. She reports that she does not drink alcohol or use illicit drugs.  Current Outpatient Prescriptions on File Prior to Visit  Medication Sig Dispense Refill  . acetaminophen (TYLENOL) 500 MG tablet Take 1,000 mg by mouth every 6 (six) hours as needed. Pain.    Marland Kitchen aspirin 325 MG EC tablet Take 325 mg by mouth daily.    . calcium carbonate (TUMS) 500 MG chewable tablet Chew 1 tablet by mouth 3 (three) times daily as needed for indigestion or heartburn.     No current facility-administered medications on file prior to visit.    ROS Review of Systems  Constitutional: Negative for fever, chills, diaphoresis, appetite change, fatigue and unexpected weight change.  HENT: Negative for congestion, ear pain, hearing loss, postnasal drip, rhinorrhea, sneezing, sore throat and trouble swallowing.   Eyes: Negative for pain.  Respiratory: Negative for cough, chest tightness and shortness of breath.   Cardiovascular: Negative for chest pain and palpitations.  Gastrointestinal: Positive for abdominal pain (heartburn). Negative for nausea, vomiting, diarrhea and constipation.  Genitourinary: Negative for dysuria, frequency and menstrual problem.  Musculoskeletal: Negative for joint swelling and arthralgias.  Skin: Negative for rash.  Neurological: Negative for dizziness, weakness, numbness and headaches.  Psychiatric/Behavioral: Negative for dysphoric mood and agitation.    Objective:  BP 198/91 mmHg  Pulse 84  Temp(Src) 98.2 F (36.8 C) (Oral)  Ht 5\' 1"  (1.549 m)  Wt 154  lb 12.8 oz (70.217 kg)  BMI 29.26 kg/m2  BP Readings from Last 3 Encounters:  05/30/14 198/91    03/01/14 173/82  04/14/13 130/90    Wt Readings from Last 3 Encounters:  05/30/14 154 lb 12.8 oz (70.217 kg)  04/14/13 151 lb (68.493 kg)  04/07/13 145 lb 3.2 oz (65.862 kg)     Physical Exam  Constitutional: She is oriented to person, place, and time. She appears well-developed and well-nourished. No distress.  HENT:  Head: Normocephalic and atraumatic.  Right Ear: External ear normal.  Left Ear: External ear normal.  Nose: Nose normal.  Mouth/Throat: Oropharynx is clear and moist.  Eyes: Conjunctivae and EOM are normal. Pupils are equal, round, and reactive to light.  Neck: Normal range of motion. Neck supple. No thyromegaly present.  Cardiovascular: Normal rate, regular rhythm and normal heart sounds.   No murmur heard. Pulmonary/Chest: Effort normal and breath sounds normal. No respiratory distress. She has no wheezes. She has no rales.  Abdominal: Soft. Bowel sounds are normal. She exhibits no distension. There is tenderness (epigastric).  Lymphadenopathy:    She has no cervical adenopathy.  Neurological: She is alert and oriented to person, place, and time. She has normal reflexes.  Skin: Skin is warm and dry.  Psychiatric: She has a normal mood and affect. Her behavior is normal. Judgment and thought content normal.    Lab Results  Component Value Date   HGBA1C 6.0* 04/07/2013   HGBA1C 5.9* 08/07/2011    Lab Results  Component Value Date   WBC 7.9 03/01/2014   HGB 12.0 03/01/2014   HCT 35.9* 03/01/2014   PLT 270 03/01/2014   GLUCOSE 151* 03/01/2014   CHOL 146 04/08/2013   TRIG 109 04/08/2013   HDL 68 04/08/2013   LDLCALC 56 04/08/2013   ALT 12 03/01/2014   AST 16 03/01/2014   NA 143 03/01/2014   K 3.6* 03/01/2014   CL 103 03/01/2014   CREATININE 0.90 05/09/2014   BUN 14 03/01/2014   CO2 23 03/01/2014   HGBA1C 6.0* 04/07/2013    Ct Chest W Contrast  05/09/2014   CLINICAL DATA:  Possible pulmonary lesion on recent chest x-ray, cough and congestion   EXAM: CT CHEST WITH CONTRAST  TECHNIQUE: Multidetector CT imaging of the chest was performed during intravenous contrast administration.  CONTRAST:  69mL OMNIPAQUE IOHEXOL 300 MG/ML  SOLN  COMPARISON:  05/02/2014  FINDINGS: The lungs are well aerated bilaterally. Minimal sub solid density is noted within the left upper lobe along the major fissure best seen on image number 17 of series 3. This is likely inflammatory in nature. It measures approximately 10 mm in greatest dimension. No definitive solid component is seen. No focal infiltrate or sizable effusion is noted. No other parenchymal nodules are seen. Minimal right basilar atelectasis is seen.  Hilar and mediastinal structures show no significant lymphadenopathy. The thoracic inlet is unremarkable. Mild calcific changes of the thoracic aorta are seen. Coronary calcifications are noted.  Scanning into the upper abdomen reveals renal cystic change on the left.  Significant scoliosis concave to the left is noted in the lumbar spine starting in the lower thoracic spine. Lateral osteophytes are identified which corresponds to the abnormality seen on the recent chest x-ray.  IMPRESSION: The abnormality on the recent chest x-ray represents lateral osteophytes in the thoracic spine.  Sub solid density in the left upper lobe as described. This is likely postinflammatory in nature. Initial follow-up by chest CT without contrast is  recommended in 3 months to confirm persistence. This recommendation follows the consensus statement: Recommendations for the Management of Subsolid Pulmonary Nodules Detected at CT: A Statement from the [employer] as published in Radiology 2013; 266:304-317.  Mild atelectasis in the right lung base.  These results will be called to the ordering clinician or representative by the Radiologist Assistant, and communication documented in the PACS or zVision Dashboard.   Electronically Signed   By: Inez Catalina M.D.   On: 05/09/2014 16:21     Assessment & Plan:   Royce was seen today for establish care and hypertension.  Diagnoses and all orders for this visit:  Essential hypertension Orders: -     lisinopril-hydrochlorothiazide (ZESTORETIC) 20-12.5 MG per tablet; Take 1 tablet by mouth daily.  Gastroesophageal reflux disease with esophagitis Orders: -     esomeprazole (NEXIUM) 40 MG capsule; Take 1 capsule (40 mg total) by mouth daily at 12 noon.  Peptic ulcer Orders: -     esomeprazole (NEXIUM) 40 MG capsule; Take 1 capsule (40 mg total) by mouth daily at 12 noon.  Left-sided weakness  Seizure Orders: -     levETIRAcetam (KEPPRA) 750 MG tablet; Take 1 tablet (750 mg total) by mouth 2 (two) times daily.  Hyperlipidemia LDL goal <100 Orders: -     simvastatin (ZOCOR) 20 MG tablet; Take 1 tablet (20 mg total) by mouth daily at 6 PM.  History of stroke   I have discontinued Ms. Lizak's pantoprazole and enalapril. I have also changed her simvastatin and esomeprazole. Additionally, I am having her start on lisinopril-hydrochlorothiazide. Lastly, I am having her maintain her acetaminophen, aspirin, calcium carbonate, and levETIRAcetam.  Meds ordered this encounter  Medications  . simvastatin (ZOCOR) 20 MG tablet    Sig: Take 1 tablet (20 mg total) by mouth daily at 6 PM.    Dispense:  90 tablet    Refill:  3  . levETIRAcetam (KEPPRA) 750 MG tablet    Sig: Take 1 tablet (750 mg total) by mouth 2 (two) times daily.    Dispense:  60 tablet    Refill:  3  . esomeprazole (NEXIUM) 40 MG capsule    Sig: Take 1 capsule (40 mg total) by mouth daily at 12 noon.    Dispense:  30 capsule    Refill:  5  . lisinopril-hydrochlorothiazide (ZESTORETIC) 20-12.5 MG per tablet    Sig: Take 1 tablet by mouth daily.    Dispense:  90 tablet    Refill:  3    Follow-up: Return in about 1 month (around 06/28/2014) for cholesterol.  Claretta Fraise, M.D.

## 2014-06-28 ENCOUNTER — Telehealth: Payer: Self-pay | Admitting: Family Medicine

## 2014-06-28 NOTE — Telephone Encounter (Signed)
Pt will wait until appt for labs

## 2014-07-01 ENCOUNTER — Encounter: Payer: Self-pay | Admitting: Family Medicine

## 2014-07-01 ENCOUNTER — Ambulatory Visit (INDEPENDENT_AMBULATORY_CARE_PROVIDER_SITE_OTHER): Payer: Medicare Other | Admitting: Family Medicine

## 2014-07-01 VITALS — BP 125/83 | HR 98 | Temp 97.1°F | Ht 61.0 in | Wt 151.8 lb

## 2014-07-01 DIAGNOSIS — Z780 Asymptomatic menopausal state: Secondary | ICD-10-CM

## 2014-07-01 DIAGNOSIS — R7303 Prediabetes: Secondary | ICD-10-CM

## 2014-07-01 DIAGNOSIS — E785 Hyperlipidemia, unspecified: Secondary | ICD-10-CM

## 2014-07-01 DIAGNOSIS — R7309 Other abnormal glucose: Secondary | ICD-10-CM | POA: Diagnosis not present

## 2014-07-01 DIAGNOSIS — I10 Essential (primary) hypertension: Secondary | ICD-10-CM | POA: Diagnosis not present

## 2014-07-01 LAB — POCT GLYCOSYLATED HEMOGLOBIN (HGB A1C): HEMOGLOBIN A1C: 6.3

## 2014-07-01 NOTE — Progress Notes (Signed)
Subjective:  Patient ID: [name], female    DOB: 06/23/1945  Age: 69 y.o. MRN: [id number]  CC: Hypertension and Hyperlipidemia   HPI [name] presents for follow-up of hypertension. Patient has no history of headache chest pain or shortness of breath or recent cough. Patient also denies symptoms of TIA such as numbness weakness lateralizing. Patient checks  blood pressure at home and has not had any elevated readings recently. Patient denies side effects from his medication. States taking it regularly.   Patient in for follow-up of elevated cholesterol. Doing well without complaints on current medication. Denies side effects of statin including myalgia and arthralgia and nausea. Also in today for liver function testing. Currently no chest pain, shortness of breath or other cardiovascular related symptoms noted.  History of prediabetic level hemoglobin A1c due to have that rechecked.  History [name] has a past medical history of Hypertension; GERD (gastroesophageal reflux disease); Stroke (May 2013); Hyperlipidemia; Dementia, vascular; and Seizure disorder.   She has past surgical history that includes Bunionectomy; Colonoscopy (10/31/2011); Esophagogastroduodenoscopy (10/31/2011); and Hemorrhoid surgery (01/31/2012).   Her family history includes Diabetes in her mother; Hyperlipidemia in her father and mother; Hypertension in her father and mother; Stroke in her mother. There is no history of Colon cancer.She reports that she has quit smoking. Her smoking use included Cigarettes. She has a 50 pack-year smoking history. She has never used smokeless tobacco. She reports that she does not drink alcohol or use illicit drugs.  Current Outpatient Prescriptions on File Prior to Visit  Medication Sig Dispense Refill  . acetaminophen (TYLENOL) 500 MG tablet Take 1,000 mg by mouth every 6 (six) hours as needed. Pain.    Marland Kitchen aspirin 325 MG EC tablet Take 325 mg by mouth daily.    . calcium carbonate  (TUMS) 500 MG chewable tablet Chew 1 tablet by mouth 3 (three) times daily as needed for indigestion or heartburn.    . esomeprazole (NEXIUM) 40 MG capsule Take 1 capsule (40 mg total) by mouth daily at 12 noon. 30 capsule 5  . levETIRAcetam (KEPPRA) 750 MG tablet Take 1 tablet (750 mg total) by mouth 2 (two) times daily. 60 tablet 3  . lisinopril-hydrochlorothiazide (ZESTORETIC) 20-12.5 MG per tablet Take 1 tablet by mouth daily. 90 tablet 3  . simvastatin (ZOCOR) 20 MG tablet Take 1 tablet (20 mg total) by mouth daily at 6 PM. 90 tablet 3   No current facility-administered medications on file prior to visit.    ROS Review of Systems  Constitutional: Negative for fever, chills, diaphoresis, appetite change, fatigue and unexpected weight change.  HENT: Negative for congestion, ear pain, hearing loss, postnasal drip, rhinorrhea, sneezing, sore throat and trouble swallowing.   Eyes: Negative for pain.  Respiratory: Negative for cough, chest tightness and shortness of breath.   Cardiovascular: Negative for chest pain and palpitations.  Gastrointestinal: Negative for nausea, vomiting, abdominal pain, diarrhea and constipation.  Genitourinary: Negative for dysuria, frequency and menstrual problem.  Musculoskeletal: Negative for joint swelling and arthralgias.  Skin: Negative for rash.  Neurological: Negative for dizziness, weakness, numbness and headaches.  Psychiatric/Behavioral: Negative for dysphoric mood and agitation.    Objective:  BP 125/83 mmHg  Pulse 98  Temp(Src) 97.1 F (36.2 C) (Oral)  Ht _0  (1.549 m)  Wt 151 lb 12.8 oz (68.856 kg)  BMI 28.70 kg/m2  BP Readings from Last 3 Encounters:  07/01/14 125/83  05/30/14 198/91  03/01/14 173/82    Wt Readings from Last  3 Encounters:  07/01/14 151 lb 12.8 oz (68.856 kg)  05/30/14 154 lb 12.8 oz (70.217 kg)  04/14/13 151 lb (68.493 kg)     Physical Exam  Constitutional: She is oriented to person, place, and time. She  appears well-developed and well-nourished. No distress.  HENT:  Head: Normocephalic and atraumatic.  Right Ear: External ear normal.  Left Ear: External ear normal.  Nose: Nose normal.  Mouth/Throat: Oropharynx is clear and moist.  Eyes: Conjunctivae and EOM are normal. Pupils are equal, round, and reactive to light.  Neck: Normal range of motion. Neck supple. No thyromegaly present.  Cardiovascular: Normal rate, regular rhythm and normal heart sounds.   No murmur heard. Pulmonary/Chest: Effort normal and breath sounds normal. No respiratory distress. She has no wheezes. She has no rales.  Abdominal: Soft. Bowel sounds are normal. She exhibits no distension. There is no tenderness.  Lymphadenopathy:    She has no cervical adenopathy.  Neurological: She is alert and oriented to person, place, and time. She has normal reflexes.  Skin: Skin is warm and dry.  Psychiatric: She has a normal mood and affect. Her behavior is normal. Judgment and thought content normal.    Lab Results  Component Value Date   HGBA1C 6.3 07/01/2014   HGBA1C 6.0* 04/07/2013   HGBA1C 5.9* 08/07/2011    Lab Results  Component Value Date   WBC 7.9 03/01/2014   HGB 12.0 03/01/2014   HCT 35.9* 03/01/2014   PLT 270 03/01/2014   GLUCOSE 151* 03/01/2014   CHOL 146 04/08/2013   TRIG 109 04/08/2013   HDL 68 04/08/2013   LDLCALC 56 04/08/2013   ALT 12 03/01/2014   AST 16 03/01/2014   NA 143 03/01/2014   K 3.6* 03/01/2014   CL 103 03/01/2014   CREATININE 0.90 05/09/2014   BUN 14 03/01/2014   CO2 23 03/01/2014   HGBA1C 6.3 07/01/2014    Ct Chest W Contrast  05/09/2014   CLINICAL DATA:  Possible pulmonary lesion on recent chest x-ray, cough and congestion  EXAM: CT CHEST WITH CONTRAST  TECHNIQUE: Multidetector CT imaging of the chest was performed during intravenous contrast administration.  CONTRAST:  7m OMNIPAQUE IOHEXOL 300 MG/ML  SOLN  COMPARISON:  05/02/2014  FINDINGS: The lungs are well aerated  bilaterally. Minimal sub solid density is noted within the left upper lobe along the major fissure best seen on image number 17 of series 3. This is likely inflammatory in nature. It measures approximately 10 mm in greatest dimension. No definitive solid component is seen. No focal infiltrate or sizable effusion is noted. No other parenchymal nodules are seen. Minimal right basilar atelectasis is seen.  Hilar and mediastinal structures show no significant lymphadenopathy. The thoracic inlet is unremarkable. Mild calcific changes of the thoracic aorta are seen. Coronary calcifications are noted.  Scanning into the upper abdomen reveals renal cystic change on the left.  Significant scoliosis concave to the left is noted in the lumbar spine starting in the lower thoracic spine. Lateral osteophytes are identified which corresponds to the abnormality seen on the recent chest x-ray.  IMPRESSION: The abnormality on the recent chest x-ray represents lateral osteophytes in the thoracic spine.  Sub solid density in the left upper lobe as described. This is likely postinflammatory in nature. Initial follow-up by chest CT without contrast is recommended in 3 months to confirm persistence. This recommendation follows the consensus statement: Recommendations for the Management of Subsolid Pulmonary Nodules Detected at CT: A Statement from the  [employer] as published in Radiology 2013; 266:304-317.  Mild atelectasis in the right lung base.  These results will be called to the ordering clinician or representative by the Radiologist Assistant, and communication documented in the PACS or zVision Dashboard.   Electronically Signed   By: Inez Catalina M.D.   On: 05/09/2014 16:21    Assessment & Plan:   Dijon was seen today for hypertension and hyperlipidemia.  Diagnoses and all orders for this visit:  Essential hypertension Orders: -     CBC with Differential/Platelet -     CMP14+EGFR -     NMR,  lipoprofile  Hyperlipidemia LDL goal <100 Orders: -     NMR, lipoprofile  Postmenopausal Orders: -     Thyroid Panel With TSH  Prediabetes Orders: -     POCT glycosylated hemoglobin (Hb A1C)   I am having Ms. Owens Shark maintain her acetaminophen, aspirin, calcium carbonate, simvastatin, levETIRAcetam, esomeprazole, lisinopril-hydrochlorothiazide, and enalapril.  Meds ordered this encounter  Medications  . enalapril (VASOTEC) 5 MG tablet    Sig: Take 1 tablet by mouth 2 (two) times daily.    Refill:  1     Follow-up: Return in about 3 months (around 09/30/2014) for hypertension, cholesterol.  Claretta Fraise, M.D.

## 2014-07-02 LAB — NMR, LIPOPROFILE
Cholesterol: 155 mg/dL (ref 100–199)
HDL CHOLESTEROL BY NMR: 67 mg/dL (ref >39)
HDL PARTICLE NUMBER: 39.5 umol/L (ref >=30.5)
LDL Particle Number: 649 nmol/L (ref <1000)
LDL SIZE: 21.1 nm (ref >20.5)
LDL-C: 44 mg/dL (ref 0–99)
LP-IR Score: 51 — ABNORMAL HIGH (ref <=45)
SMALL LDL PARTICLE NUMBER: 233 nmol/L (ref <=527)
TRIGLYCERIDES BY NMR: 221 mg/dL — AB (ref 0–149)

## 2014-07-02 LAB — CMP14+EGFR
A/G RATIO: 1.8 (ref 1.1–2.5)
ALBUMIN: 4.7 g/dL (ref 3.5–4.8)
ALK PHOS: 89 IU/L (ref 39–117)
ALT: 13 IU/L (ref 0–32)
AST: 13 IU/L (ref 0–40)
BUN / CREAT RATIO: 24 (ref 11–26)
BUN: 27 mg/dL (ref 8–27)
Bilirubin Total: 0.2 mg/dL (ref 0.0–1.2)
CHLORIDE: 101 mmol/L (ref 97–108)
CO2: 25 mmol/L (ref 18–29)
Calcium: 9.8 mg/dL (ref 8.7–10.3)
Creatinine, Ser: 1.13 mg/dL — ABNORMAL HIGH (ref 0.57–1.00)
GFR, EST AFRICAN AMERICAN: 57 mL/min/{1.73_m2} — AB (ref >59)
GFR, EST NON AFRICAN AMERICAN: 49 mL/min/{1.73_m2} — AB (ref >59)
GLOBULIN, TOTAL: 2.6 g/dL (ref 1.5–4.5)
GLUCOSE: 108 mg/dL — AB (ref 65–99)
Potassium: 4.3 mmol/L (ref 3.5–5.2)
Sodium: 142 mmol/L (ref 134–144)
TOTAL PROTEIN: 7.3 g/dL (ref 6.0–8.5)

## 2014-07-02 LAB — THYROID PANEL WITH TSH
Free Thyroxine Index: 2.2 (ref 1.2–4.9)
T3 UPTAKE RATIO: 25 % (ref 24–39)
T4 TOTAL: 8.8 ug/dL (ref 4.5–12.0)
TSH: 1.34 u[IU]/mL (ref 0.450–4.500)

## 2014-07-06 ENCOUNTER — Telehealth: Payer: Self-pay | Admitting: Family Medicine

## 2014-07-06 NOTE — Telephone Encounter (Signed)
-----   Message from Claretta Fraise, MD sent at 07/04/2014  5:43 PM EDT ----- Gardiner Barefoot,    Your lab result is normal.Some minor variations that are not significant are commonly marked abnormal, but do not represent any medical problem for you.  Best regards, Claretta Fraise, M.D.

## 2014-07-07 NOTE — Telephone Encounter (Signed)
Pt notified of results Verbalizes understanding 

## 2014-07-07 NOTE — Telephone Encounter (Signed)
-----   Message from Claretta Fraise, MD sent at 07/04/2014  5:43 PM EDT ----- Gardiner Barefoot,    Your lab result is normal.Some minor variations that are not significant are commonly marked abnormal, but do not represent any medical problem for you.  Best regards, Claretta Fraise, M.D.

## 2014-09-27 ENCOUNTER — Other Ambulatory Visit: Payer: Self-pay | Admitting: Family Medicine

## 2014-10-04 ENCOUNTER — Ambulatory Visit: Payer: Medicare Other | Admitting: Family Medicine

## 2014-10-13 ENCOUNTER — Encounter: Payer: Self-pay | Admitting: Family Medicine

## 2014-10-13 ENCOUNTER — Ambulatory Visit (INDEPENDENT_AMBULATORY_CARE_PROVIDER_SITE_OTHER): Payer: Medicare Other | Admitting: Family Medicine

## 2014-10-13 VITALS — BP 122/66 | HR 79 | Temp 97.0°F | Ht 61.0 in | Wt 153.6 lb

## 2014-10-13 DIAGNOSIS — I10 Essential (primary) hypertension: Secondary | ICD-10-CM

## 2014-10-13 DIAGNOSIS — R569 Unspecified convulsions: Secondary | ICD-10-CM | POA: Diagnosis not present

## 2014-10-13 DIAGNOSIS — R252 Cramp and spasm: Secondary | ICD-10-CM

## 2014-10-13 DIAGNOSIS — E785 Hyperlipidemia, unspecified: Secondary | ICD-10-CM

## 2014-10-13 NOTE — Progress Notes (Signed)
Subjective:  Patient ID: [name], female    DOB: 1945-07-26  Age: 69 y.o. MRN: [id number]  CC: Hypertension; Hyperlipidemia; and Seizures   HPI [name] presents for  follow-up of hypertension. Patient has no history of headache chest pain or shortness of breath or recent cough. Patient also denies symptoms of TIA such as numbness weakness lateralizing. Patient checks  blood pressure at home and has not had any elevated readings recently. Patient denies side effects from his medication. States taking it regularly.   Patient in for follow-up of elevated cholesterol. Doing well without complaints on current medication. Denies side effects of statin including myalgia and arthralgia and nausea. Also in today for liver function testing. Currently no chest pain, shortness of breath or other cardiovascular related symptoms noted.  Patient has a seizure disorder. There have been no seizures for over a year. Medications for seizures seem to be effective primarily levetiracetam. However she is having some leg cramps and wonders if medication may be the source. She also would like to have a potassium level checked due to leg cramps. The cramps are moderate in severity. Not directly related to activity position or time of day.  History [name] has a past medical history of Hypertension; GERD (gastroesophageal reflux disease); Stroke (May 2013); Hyperlipidemia; Dementia, vascular; and Seizure disorder.   She has past surgical history that includes Bunionectomy; Colonoscopy (10/31/2011); Esophagogastroduodenoscopy (10/31/2011); and Hemorrhoid surgery (01/31/2012).   Her family history includes Diabetes in her mother; Hyperlipidemia in her father and mother; Hypertension in her father and mother; Stroke in her mother. There is no history of Colon cancer.She reports that she has quit smoking. Her smoking use included Cigarettes. She has a 50 pack-year smoking history. She has never used smokeless tobacco. She  reports that she does not drink alcohol or use illicit drugs.  Outpatient Prescriptions Prior to Visit  Medication Sig Dispense Refill  . acetaminophen (TYLENOL) 500 MG tablet Take 1,000 mg by mouth every 6 (six) hours as needed. Pain.    Marland Kitchen aspirin 325 MG EC tablet Take 325 mg by mouth daily.    . calcium carbonate (TUMS) 500 MG chewable tablet Chew 1 tablet by mouth 3 (three) times daily as needed for indigestion or heartburn.    . esomeprazole (NEXIUM) 40 MG capsule Take 1 capsule (40 mg total) by mouth daily at 12 noon. 30 capsule 5  . levETIRAcetam (KEPPRA) 750 MG tablet TAKE ONE TABLET BY MOUTH TWICE DAILY 60 tablet 1  . lisinopril-hydrochlorothiazide (ZESTORETIC) 20-12.5 MG per tablet Take 1 tablet by mouth daily. 90 tablet 3  . simvastatin (ZOCOR) 20 MG tablet Take 1 tablet (20 mg total) by mouth daily at 6 PM. 90 tablet 3  . enalapril (VASOTEC) 5 MG tablet Take 1 tablet by mouth 2 (two) times daily.  1   No facility-administered medications prior to visit.    ROS Review of Systems  Constitutional: Negative for fever, chills, diaphoresis, appetite change, fatigue and unexpected weight change.  HENT: Negative for congestion, ear pain, hearing loss, postnasal drip, rhinorrhea, sneezing, sore throat and trouble swallowing.   Eyes: Negative for pain.  Respiratory: Negative for cough, chest tightness and shortness of breath.   Cardiovascular: Negative for chest pain and palpitations.  Gastrointestinal: Negative for nausea, vomiting, abdominal pain, diarrhea and constipation.  Genitourinary: Negative for dysuria, frequency and menstrual problem.  Musculoskeletal: Negative for joint swelling and arthralgias.  Skin: Negative for rash.  Neurological: Negative for dizziness, weakness, numbness and headaches.  Psychiatric/Behavioral: Negative for dysphoric mood and agitation.    Objective:  BP 122/66 mmHg  Pulse 79  Temp(Src) 97 F (36.1 C) (Oral)  Ht 5' 1"  (1.549 m)  Wt 153 lb 9.6  oz (69.673 kg)  BMI 29.04 kg/m2  BP Readings from Last 3 Encounters:  10/13/14 122/66  07/01/14 125/83  05/30/14 198/91    Wt Readings from Last 3 Encounters:  10/13/14 153 lb 9.6 oz (69.673 kg)  07/01/14 151 lb 12.8 oz (68.856 kg)  05/30/14 154 lb 12.8 oz (70.217 kg)     Physical Exam  Constitutional: She is oriented to person, place, and time. She appears well-developed and well-nourished. No distress.  HENT:  Head: Normocephalic and atraumatic.  Right Ear: External ear normal.  Left Ear: External ear normal.  Nose: Nose normal.  Mouth/Throat: Oropharynx is clear and moist.  Eyes: Conjunctivae and EOM are normal. Pupils are equal, round, and reactive to light.  Neck: Normal range of motion. Neck supple. No thyromegaly present.  Cardiovascular: Normal rate, regular rhythm and normal heart sounds.   No murmur heard. Pulmonary/Chest: Effort normal and breath sounds normal. No respiratory distress. She has no wheezes. She has no rales.  Abdominal: Soft. Bowel sounds are normal. She exhibits no distension. There is no tenderness.  Lymphadenopathy:    She has no cervical adenopathy.  Neurological: She is alert and oriented to person, place, and time. She has normal reflexes.  Skin: Skin is warm and dry.  Psychiatric: She has a normal mood and affect. Her behavior is normal. Judgment and thought content normal.    Lab Results  Component Value Date   HGBA1C 6.3 07/01/2014   HGBA1C 6.0* 04/07/2013   HGBA1C 5.9* 08/07/2011    Lab Results  Component Value Date   WBC 7.9 03/01/2014   HGB 12.0 03/01/2014   HCT 35.9* 03/01/2014   PLT 270 03/01/2014   GLUCOSE 100* 10/13/2014   CHOL 155 07/01/2014   TRIG 221* 07/01/2014   HDL 67 07/01/2014   LDLCALC 56 04/08/2013   ALT 10 10/13/2014   AST 16 10/13/2014   NA 141 10/13/2014   K 4.3 10/13/2014   CL 101 10/13/2014   CREATININE 1.04* 10/13/2014   BUN 29* 10/13/2014   CO2 21 10/13/2014   TSH 1.340 07/01/2014   HGBA1C 6.3  07/01/2014    Ct Chest W Contrast  05/09/2014   CLINICAL DATA:  Possible pulmonary lesion on recent chest x-ray, cough and congestion  EXAM: CT CHEST WITH CONTRAST  TECHNIQUE: Multidetector CT imaging of the chest was performed during intravenous contrast administration.  CONTRAST:  66m OMNIPAQUE IOHEXOL 300 MG/ML  SOLN  COMPARISON:  05/02/2014  FINDINGS: The lungs are well aerated bilaterally. Minimal sub solid density is noted within the left upper lobe along the major fissure best seen on image number 17 of series 3. This is likely inflammatory in nature. It measures approximately 10 mm in greatest dimension. No definitive solid component is seen. No focal infiltrate or sizable effusion is noted. No other parenchymal nodules are seen. Minimal right basilar atelectasis is seen.  Hilar and mediastinal structures show no significant lymphadenopathy. The thoracic inlet is unremarkable. Mild calcific changes of the thoracic aorta are seen. Coronary calcifications are noted.  Scanning into the upper abdomen reveals renal cystic change on the left.  Significant scoliosis concave to the left is noted in the lumbar spine starting in the lower thoracic spine. Lateral osteophytes are identified which corresponds to the abnormality seen on  the recent chest x-ray.  IMPRESSION: The abnormality on the recent chest x-ray represents lateral osteophytes in the thoracic spine.  Sub solid density in the left upper lobe as described. This is likely postinflammatory in nature. Initial follow-up by chest CT without contrast is recommended in 3 months to confirm persistence. This recommendation follows the consensus statement: Recommendations for the Management of Subsolid Pulmonary Nodules Detected at CT: A Statement from the [employer] as published in Radiology 2013; 266:304-317.  Mild atelectasis in the right lung base.  These results will be called to the ordering clinician or representative by the Radiologist Assistant,  and communication documented in the PACS or zVision Dashboard.   Electronically Signed   By: Inez Catalina M.D.   On: 05/09/2014 16:21    Assessment & Plan:   [name] was seen today for hypertension, hyperlipidemia and seizures.  Diagnoses and all orders for this visit:  Essential hypertension Orders: -     CMP14+EGFR  Hyperlipemia Orders: -     CMP14+EGFR  Seizure Orders: -     Levetiracetam level  Cramp of both lower extremities Orders: -     Magnesium -     Phosphorus   I have discontinued [name]'s enalapril. I am also having her maintain her acetaminophen, aspirin, calcium carbonate, simvastatin, esomeprazole, lisinopril-hydrochlorothiazide, and levETIRAcetam.  No orders of the defined types were placed in this encounter.     Follow-up: Return in about 6 months (around 04/15/2015) for hypertension.  Claretta Fraise, M.D.

## 2014-10-14 LAB — CMP14+EGFR
ALT: 10 IU/L (ref 0–32)
AST: 16 IU/L (ref 0–40)
Albumin/Globulin Ratio: 1.8 (ref 1.1–2.5)
Albumin: 4.4 g/dL (ref 3.5–4.8)
Alkaline Phosphatase: 86 IU/L (ref 39–117)
BUN / CREAT RATIO: 28 — AB (ref 11–26)
BUN: 29 mg/dL — AB (ref 8–27)
Bilirubin Total: <0.2 mg/dL (ref 0.0–1.2)
CHLORIDE: 101 mmol/L (ref 97–108)
CO2: 21 mmol/L (ref 18–29)
Calcium: 9.6 mg/dL (ref 8.7–10.3)
Creatinine, Ser: 1.04 mg/dL — ABNORMAL HIGH (ref 0.57–1.00)
GFR calc Af Amer: 63 mL/min/{1.73_m2} (ref >59)
GFR, EST NON AFRICAN AMERICAN: 55 mL/min/{1.73_m2} — AB (ref >59)
GLUCOSE: 100 mg/dL — AB (ref 65–99)
Globulin, Total: 2.4 g/dL (ref 1.5–4.5)
Potassium: 4.3 mmol/L (ref 3.5–5.2)
SODIUM: 141 mmol/L (ref 134–144)
Total Protein: 6.8 g/dL (ref 6.0–8.5)

## 2014-10-18 ENCOUNTER — Encounter: Payer: Self-pay | Admitting: *Deleted

## 2014-11-19 ENCOUNTER — Other Ambulatory Visit: Payer: Self-pay | Admitting: Family Medicine

## 2014-12-04 ENCOUNTER — Emergency Department (HOSPITAL_COMMUNITY): Payer: Medicare Other

## 2014-12-04 ENCOUNTER — Emergency Department (HOSPITAL_COMMUNITY)
Admission: EM | Admit: 2014-12-04 | Discharge: 2014-12-04 | Disposition: A | Discharge status: Home / Self Care | Payer: Medicare Other | Attending: Emergency Medicine | Admitting: Emergency Medicine

## 2014-12-04 ENCOUNTER — Encounter (HOSPITAL_COMMUNITY): Payer: Self-pay | Admitting: Cardiology

## 2014-12-04 DIAGNOSIS — N179 Acute kidney failure, unspecified: Secondary | ICD-10-CM | POA: Diagnosis not present

## 2014-12-04 DIAGNOSIS — Z7982 Long term (current) use of aspirin: Secondary | ICD-10-CM | POA: Insufficient documentation

## 2014-12-04 DIAGNOSIS — Z79899 Other long term (current) drug therapy: Secondary | ICD-10-CM | POA: Insufficient documentation

## 2014-12-04 DIAGNOSIS — R202 Paresthesia of skin: Secondary | ICD-10-CM

## 2014-12-04 DIAGNOSIS — I10 Essential (primary) hypertension: Secondary | ICD-10-CM | POA: Insufficient documentation

## 2014-12-04 DIAGNOSIS — F0150 Vascular dementia without behavioral disturbance: Secondary | ICD-10-CM | POA: Diagnosis not present

## 2014-12-04 DIAGNOSIS — Z8673 Personal history of transient ischemic attack (TIA), and cerebral infarction without residual deficits: Secondary | ICD-10-CM | POA: Insufficient documentation

## 2014-12-04 DIAGNOSIS — K219 Gastro-esophageal reflux disease without esophagitis: Secondary | ICD-10-CM | POA: Insufficient documentation

## 2014-12-04 DIAGNOSIS — G40909 Epilepsy, unspecified, not intractable, without status epilepticus: Secondary | ICD-10-CM | POA: Insufficient documentation

## 2014-12-04 DIAGNOSIS — E785 Hyperlipidemia, unspecified: Secondary | ICD-10-CM | POA: Insufficient documentation

## 2014-12-04 DIAGNOSIS — R200 Anesthesia of skin: Secondary | ICD-10-CM | POA: Diagnosis present

## 2014-12-04 DIAGNOSIS — Z87891 Personal history of nicotine dependence: Secondary | ICD-10-CM | POA: Diagnosis not present

## 2014-12-04 LAB — DIFFERENTIAL
BASOS ABS: 0 10*3/uL (ref 0.0–0.1)
Basophils Relative: 0 % (ref 0–1)
Eosinophils Absolute: 0.1 10*3/uL (ref 0.0–0.7)
Eosinophils Relative: 2 % (ref 0–5)
LYMPHS ABS: 2.7 10*3/uL (ref 0.7–4.0)
Lymphocytes Relative: 48 % — ABNORMAL HIGH (ref 12–46)
MONOS PCT: 6 % (ref 3–12)
Monocytes Absolute: 0.4 10*3/uL (ref 0.1–1.0)
NEUTROS ABS: 2.4 10*3/uL (ref 1.7–7.7)
Neutrophils Relative %: 44 % (ref 43–77)

## 2014-12-04 LAB — I-STAT CHEM 8, ED
BUN: 34 mg/dL — ABNORMAL HIGH (ref 6–20)
CALCIUM ION: 1.24 mmol/L (ref 1.13–1.30)
CREATININE: 1.5 mg/dL — AB (ref 0.44–1.00)
Chloride: 104 mmol/L (ref 101–111)
GLUCOSE: 131 mg/dL — AB (ref 65–99)
HCT: 36 % (ref 36.0–46.0)
HEMOGLOBIN: 12.2 g/dL (ref 12.0–15.0)
POTASSIUM: 3.8 mmol/L (ref 3.5–5.1)
Sodium: 142 mmol/L (ref 135–145)
TCO2: 25 mmol/L (ref 0–100)

## 2014-12-04 LAB — RAPID URINE DRUG SCREEN, HOSP PERFORMED
Amphetamines: NOT DETECTED
BARBITURATES: NOT DETECTED
Benzodiazepines: NOT DETECTED
Cocaine: NOT DETECTED
Opiates: NOT DETECTED
Tetrahydrocannabinol: NOT DETECTED

## 2014-12-04 LAB — COMPREHENSIVE METABOLIC PANEL
ALT: 12 U/L — AB (ref 14–54)
AST: 16 U/L (ref 15–41)
Albumin: 4.4 g/dL (ref 3.5–5.0)
Alkaline Phosphatase: 86 U/L (ref 38–126)
Anion gap: 6 (ref 5–15)
BILIRUBIN TOTAL: 0.3 mg/dL (ref 0.3–1.2)
BUN: 35 mg/dL — AB (ref 6–20)
CALCIUM: 9 mg/dL (ref 8.9–10.3)
CO2: 27 mmol/L (ref 22–32)
CREATININE: 1.54 mg/dL — AB (ref 0.44–1.00)
Chloride: 106 mmol/L (ref 101–111)
GFR calc Af Amer: 38 mL/min — ABNORMAL LOW (ref >60)
GFR, EST NON AFRICAN AMERICAN: 33 mL/min — AB (ref >60)
Glucose, Bld: 134 mg/dL — ABNORMAL HIGH (ref 65–99)
Potassium: 3.8 mmol/L (ref 3.5–5.1)
Sodium: 139 mmol/L (ref 135–145)
TOTAL PROTEIN: 7.6 g/dL (ref 6.5–8.1)

## 2014-12-04 LAB — URINE MICROSCOPIC-ADD ON

## 2014-12-04 LAB — URINALYSIS, ROUTINE W REFLEX MICROSCOPIC
Bilirubin Urine: NEGATIVE
GLUCOSE, UA: NEGATIVE mg/dL
KETONES UR: NEGATIVE mg/dL
NITRITE: NEGATIVE
PROTEIN: NEGATIVE mg/dL
Specific Gravity, Urine: 1.025 (ref 1.005–1.030)
UROBILINOGEN UA: 0.2 mg/dL (ref 0.0–1.0)
pH: 5 (ref 5.0–8.0)

## 2014-12-04 LAB — CBC
HEMATOCRIT: 34.5 % — AB (ref 36.0–46.0)
HEMOGLOBIN: 11.4 g/dL — AB (ref 12.0–15.0)
MCH: 29.3 pg (ref 26.0–34.0)
MCHC: 33 g/dL (ref 30.0–36.0)
MCV: 88.7 fL (ref 78.0–100.0)
Platelets: 276 10*3/uL (ref 150–400)
RBC: 3.89 MIL/uL (ref 3.87–5.11)
RDW: 13.1 % (ref 11.5–15.5)
WBC: 5.6 10*3/uL (ref 4.0–10.5)

## 2014-12-04 LAB — PROTIME-INR
INR: 1.08 (ref 0.00–1.49)
Prothrombin Time: 14.2 seconds (ref 11.6–15.2)

## 2014-12-04 LAB — I-STAT TROPONIN, ED: Troponin i, poc: 0 ng/mL (ref 0.00–0.08)

## 2014-12-04 LAB — ETHANOL: Alcohol, Ethyl (B): <5 mg/dL (ref <5)

## 2014-12-04 LAB — APTT: APTT: 26 s (ref 24–37)

## 2014-12-04 NOTE — ED Provider Notes (Signed)
CSN: [id number]     Arrival date & time 12/04/14  1131 History  This chart was scribed for Noemi Chapel, MD by Stephania Fragmin, ED Scribe. This patient was seen in room APA01/[facility]01 and the patient's care was started at 11:47 AM.    Chief Complaint  Patient presents with  . Numbness   The history is provided by the patient. No language interpreter was used.    HPI Comments: [name] is a 69 y.o. female with a history of CVA, HTN, and HLD, currently taking a daily aspirin, who presents to the Emergency Department complaining of intermittent tingling in her right foot and in her bilateral hands that began yesterday. Patient has a history of CVA which and was concerned this may be one, although at that time she had more confusion, slurred speech, and was mixed-up when trying to put her pants on. At this time, she denies any visual changes, difficulties walking and talking, slurred speech, or balance issues - patient notes she is able to perform activities of daily living without difficulty. She denies any numbness or tingling in her right leg or face.  About 1 month ago, patient had basic blood tests done at [facility], which all returned normal. Patient states she was in the process of moving and cleaning yesterday and exerted herself more than usual.  Past Medical History  Diagnosis Date  . Hypertension   . GERD (gastroesophageal reflux disease)   . Stroke May 2013  . Hyperlipidemia   . Dementia, vascular   . Seizure disorder    Past Surgical History  Procedure Laterality Date  . Bunionectomy    . Colonoscopy  10/31/2011     LARGE Internal hemorrhoids, S/P BANDING/ ADENOMATOUS APPEARING Polyp in the ascending colon/ multiple polyps in the  descending, sigmoid   colon and rectumSIMPLE ADENOMAS, SURVEILLANCE 2023  . Esophagogastroduodenoscopy  10/31/2011    LARGE Hiatal hernia/Mild gastritis  . Hemorrhoid surgery  01/31/2012    Procedure: HEMORRHOIDECTOMY;  Surgeon: Jamesetta So, MD;   Location: [facility];  Service: General;  Laterality: N/A;   Family History  Problem Relation Age of Onset  . Stroke Mother   . Diabetes Mother   . Hypertension Mother   . Hyperlipidemia Mother   . Hypertension Father   . Hyperlipidemia Father   . Colon cancer Neg Hx    Social History  Substance Use Topics  . Smoking status: Former Smoker -- 1.00 packs/day for 50 years    Types: Cigarettes  . Smokeless tobacco: Never Used     Comment: May of 2013  . Alcohol Use: No   OB History    No data available     Review of Systems  Eyes: Negative for visual disturbance.  Musculoskeletal: Negative for gait problem.  Neurological: Positive for numbness (pt describes as a tingling sensation). Negative for speech difficulty.  Psychiatric/Behavioral: Negative for confusion.  All other systems reviewed and are negative.  Allergies  Review of patient's allergies indicates no known allergies.  Home Medications   Prior to Admission medications   Medication Sig Start Date End Date Taking? Authorizing Provider  aspirin 325 MG EC tablet Take 325 mg by mouth daily.   Yes Historical Provider, MD  calcium carbonate (TUMS) 500 MG chewable tablet Chew 1 tablet by mouth 3 (three) times daily as needed for indigestion or heartburn.   Yes Historical Provider, MD  esomeprazole (NEXIUM) 40 MG capsule TAKE ONE CAPSULE BY MOUTH ONCE DAILY AT 12 NOON Patient  taking differently: TAKE ONE CAPSULE BY MOUTH ONCE DAILY 11/21/14  Yes Claretta Fraise, MD  levETIRAcetam (KEPPRA) 750 MG tablet TAKE ONE TABLET BY MOUTH TWICE DAILY 11/21/14  Yes Claretta Fraise, MD  lisinopril-hydrochlorothiazide (ZESTORETIC) 20-12.5 MG per tablet Take 1 tablet by mouth daily. 05/30/14  Yes Claretta Fraise, MD  simvastatin (ZOCOR) 20 MG tablet Take 1 tablet (20 mg total) by mouth daily at 6 PM. 05/30/14  Yes Claretta Fraise, MD   BP 143/53 mmHg  Pulse 85  Temp(Src) 97.6 F (36.4 C) (Oral)  Resp 16  Ht 5\' 2"  (1.575 m)  Wt 152 lb (68.947 kg)   BMI 27.79 kg/m2  SpO2 100% Physical Exam  Constitutional: She appears well-developed and well-nourished. No distress.  HENT:  Head: Normocephalic and atraumatic.  Mouth/Throat: Oropharynx is clear and moist. No oropharyngeal exudate.  Eyes: Conjunctivae and EOM are normal. Pupils are equal, round, and reactive to light. Right eye exhibits no discharge. Left eye exhibits no discharge. No scleral icterus.  Neck: Normal range of motion. Neck supple. No JVD present. Carotid bruit is not present. No thyromegaly present.  Cardiovascular: Normal rate, regular rhythm, normal heart sounds and intact distal pulses.  Exam reveals no gallop and no friction rub.   No murmur heard. No carotid bruits.   Pulmonary/Chest: Effort normal and breath sounds normal. No respiratory distress. She has no wheezes. She has no rales.  Abdominal: Soft. Bowel sounds are normal. She exhibits no distension and no mass. There is no tenderness.  Musculoskeletal: Normal range of motion. She exhibits no edema or tenderness.  Lymphadenopathy:    She has no cervical adenopathy.  Neurological: She is alert. Coordination normal.  Neurologic exam:  Speech clear, pupils equal round reactive to light, extraocular movements intact  Normal peripheral visual fields Cranial nerves III through XII normal including no facial droop Follows commands, moves all extremities x4, normal strength to bilateral upper and lower extremities at all major muscle groups including grip Sensation normal to light touch and pinprick Coordination intact, no limb ataxia, finger-nose-finger normal Rapid alternating movements normal No pronator drift Gait normal   Skin: Skin is warm and dry. No rash noted. No erythema.  Psychiatric: She has a normal mood and affect. Her behavior is normal.  Nursing note and vitals reviewed.   ED Course  Procedures (including critical care time)  DIAGNOSTIC STUDIES: Oxygen Saturation is 100% on RA, normal by my  interpretation.    COORDINATION OF CARE: 11:51 AM - Discussed treatment plan with pt at bedside which includes diagnostic tests. Pt verbalized understanding and agreed to plan.   Labs Review Labs Reviewed  CBC - Abnormal; Notable for the following:    Hemoglobin 11.4 (*)    HCT 34.5 (*)    All other components within normal limits  DIFFERENTIAL - Abnormal; Notable for the following:    Lymphocytes Relative 48 (*)    All other components within normal limits  COMPREHENSIVE METABOLIC PANEL - Abnormal; Notable for the following:    Glucose, Bld 134 (*)    BUN 35 (*)    Creatinine, Ser 1.54 (*)    ALT 12 (*)    GFR calc non Af Amer 33 (*)    GFR calc Af Amer 38 (*)    All other components within normal limits  URINALYSIS, ROUTINE W REFLEX MICROSCOPIC (NOT AT [facility]) - Abnormal; Notable for the following:    Hgb urine dipstick MODERATE (*)    Leukocytes, UA TRACE (*)  All other components within normal limits  URINE MICROSCOPIC-ADD ON - Abnormal; Notable for the following:    Squamous Epithelial / LPF FEW (*)    Casts HYALINE CASTS (*)    All other components within normal limits  I-STAT CHEM 8, ED - Abnormal; Notable for the following:    BUN 34 (*)    Creatinine, Ser 1.50 (*)    Glucose, Bld 131 (*)    All other components within normal limits  ETHANOL  PROTIME-INR  APTT  URINE RAPID DRUG SCREEN, HOSP PERFORMED  I-STAT TROPOININ, ED    Imaging Review Ct Head Wo Contrast  12/04/2014   CLINICAL DATA:  Right foot numbness began yesterday.  EXAM: CT HEAD WITHOUT CONTRAST  TECHNIQUE: Contiguous axial images were obtained from the base of the skull through the vertex without intravenous contrast.  COMPARISON:  03/01/2014  FINDINGS: There is no evidence of mass effect, midline shift, or extra-axial fluid collections. There is no evidence of a space-occupying lesion or intracranial hemorrhage. There is no evidence of a cortical-based area of acute infarction. There is an old right MCA  territory infarct. There is periventricular white matter low attenuation likely secondary to microangiopathy.  The ventricles and sulci are appropriate for the patient's age. The basal cisterns are patent.  Visualized portions of the orbits are unremarkable. There is mild right ethmoid and frontal sinus mucosal thickening.  The osseous structures are unremarkable.  IMPRESSION: No acute intracranial pathology.   Electronically Signed   By: Kathreen Devoid   On: 12/04/2014 12:49   I have personally reviewed and evaluated these images and lab results as part of my medical decision-making.   EKG Interpretation   Date/Time:  Sunday December 04 2014 12:36:50 EDT Ventricular Rate:  72 PR Interval:  161 QRS Duration: 88 QT Interval:  431 QTC Calculation: 472 R Axis:   62 Text Interpretation:  Age not entered, assumed to be  69 years old for  purpose of ECG interpretation Sinus rhythm Borderline T abnormalities,  lateral leads Since last tracing T wave abnormality NOW PRESENT Confirmed  by Sabra Heck  MD, Agostino Gorin ([id number]) on 12/04/2014 12:43:25 PM      MDM   Final diagnoses:  AKI (acute kidney injury)  Paresthesias    The patient has normal vital signs, no hypertension, no focal neurologic deficits on exam. She has been having bilateral hand symptoms which is not consistent with a cortical stroke. I have looked at her CT scan, reviewed her blood work, EKG and find no acute findings other than a mild acute kidney injury of which the patient was informed and instructed to take more fluids. The latch lites are normal, she will be instructed to follow-up with doctor in one week for recheck of her kidney function. I discussed the care of the patient with the on-call neurologist who agrees with the plan and suggested no more acute evaluation is necessary at this time. The patient is in total agreement with the plan.  I personally performed the services described in this documentation, which was scribed in my  presence. The recorded information has been reviewed and is accurate.         Noemi Chapel, MD 12/04/14 [phone]

## 2014-12-04 NOTE — ED Notes (Signed)
Right foot numbness that started yesterday.  This morning has numbness also in both hands.

## 2014-12-04 NOTE — Discharge Instructions (Signed)

## 2015-01-01 ENCOUNTER — Other Ambulatory Visit: Payer: Self-pay | Admitting: Family Medicine

## 2015-01-02 NOTE — Telephone Encounter (Signed)
Stacks pt 

## 2015-01-09 IMAGING — CT CT HEAD W/O CM
1 of 2 series · 14 of 30 positions shown, 18 images · non-contrast
Comparison: CT scan dated 04/07/2013

CLINICAL DATA: Acute stroke.

EXAM:
CT HEAD WITHOUT CONTRAST
TECHNIQUE: Contiguous axial images were obtained from the base of the skull
through the vertex without intravenous contrast.

[Series 2: headseq 4.8 h37s · axial · 0.43mm/px · z∈[+1115,+1262]mm · 14 of 36 slices shown, 18 images]
[im 3/36  brain]
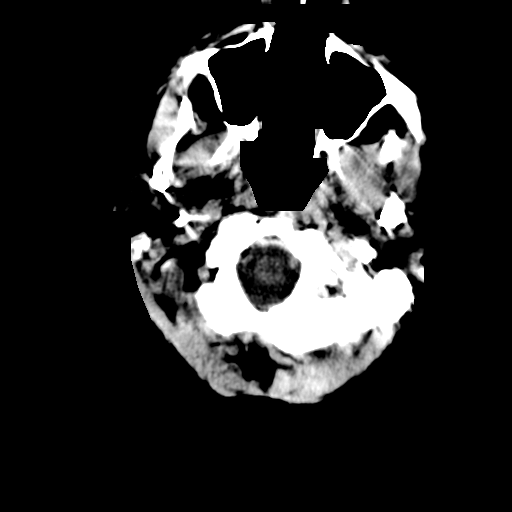
[im 3/36  bone]
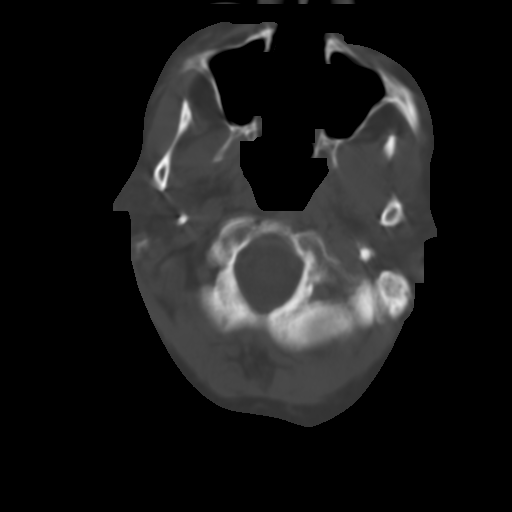
[im 5/36  brain]
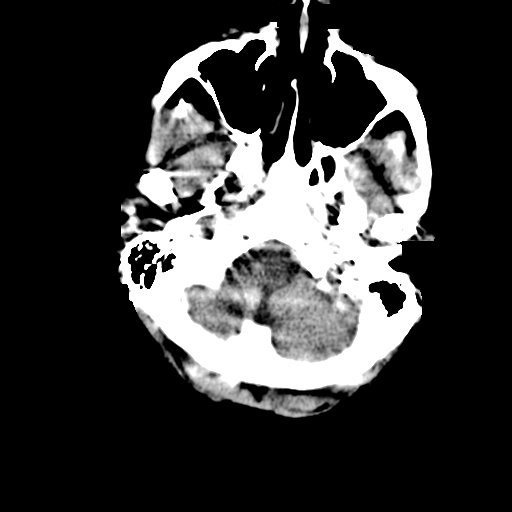
[im 8/36  brain]
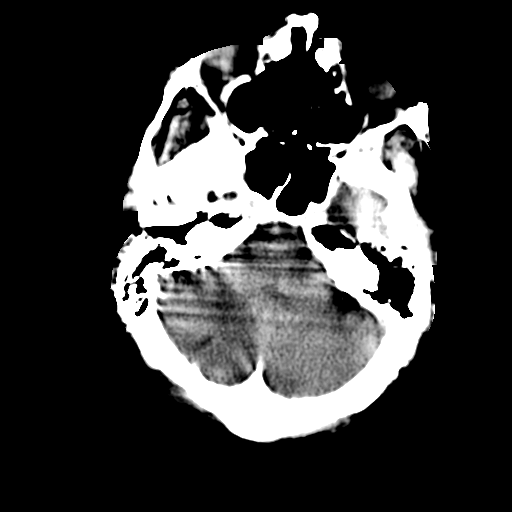
[im 10/36  brain]
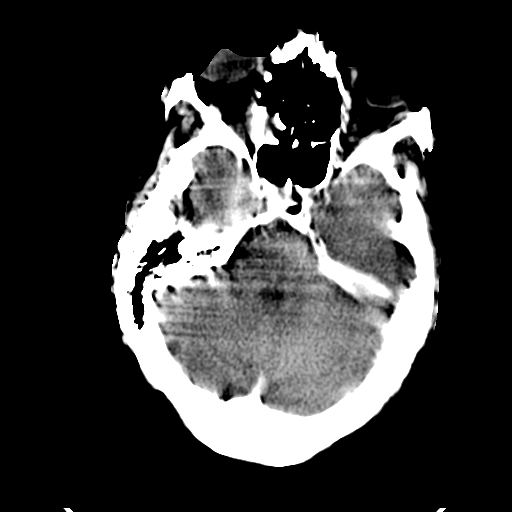
[im 12/36  brain]
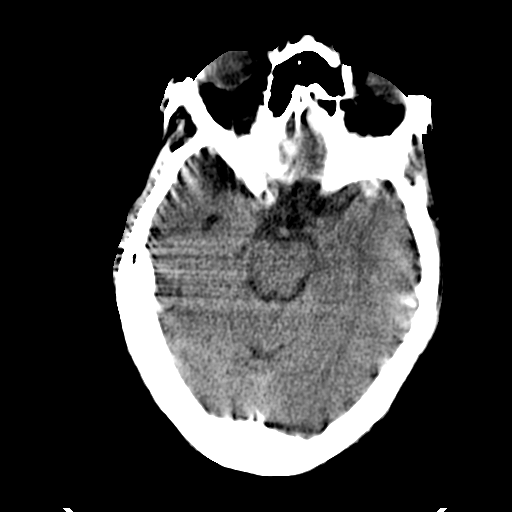
[im 12/36  bone]
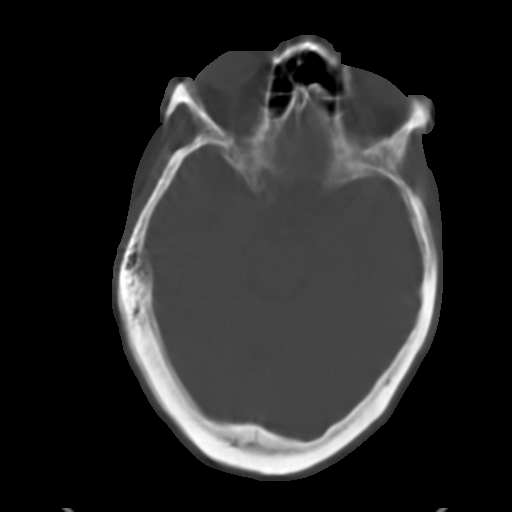
[im 15/36  brain]
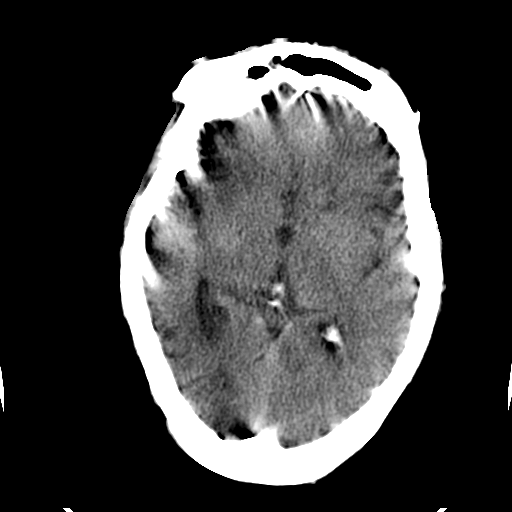
[im 17/36  brain]
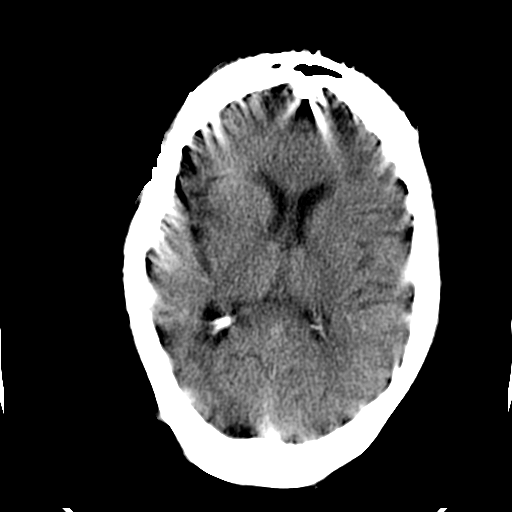
[im 19/36  brain]
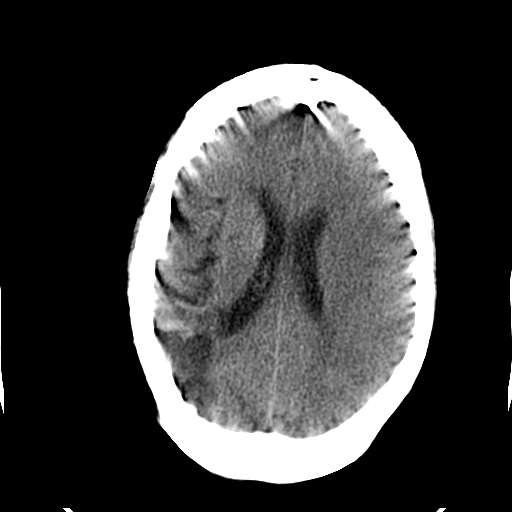
[im 22/36  brain]
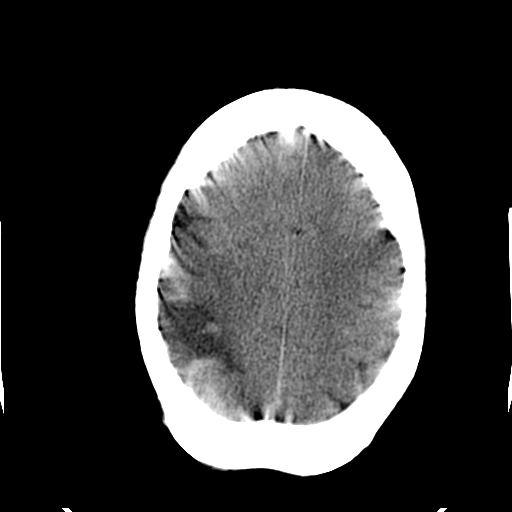
[im 22/36  bone]
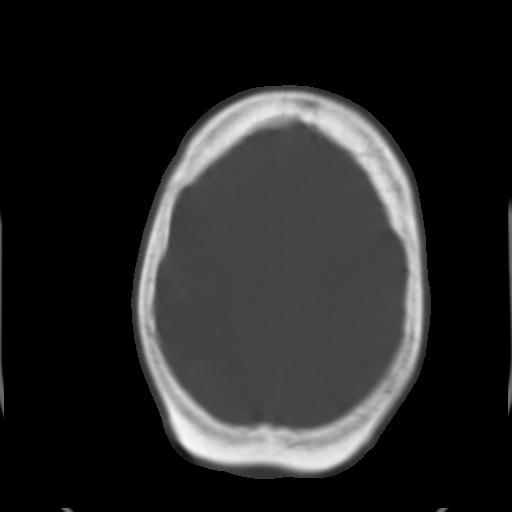
[im 24/36  brain]
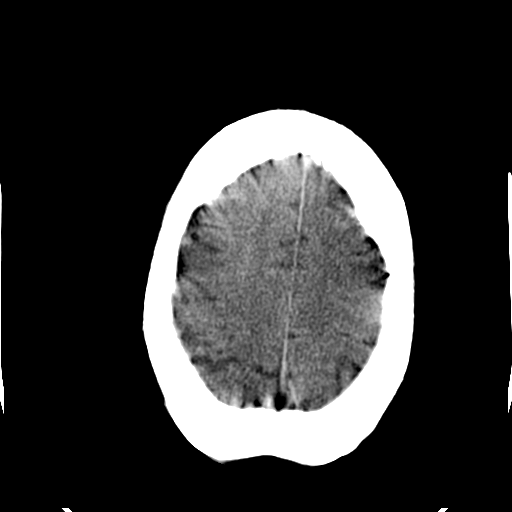
[im 26/36  brain]
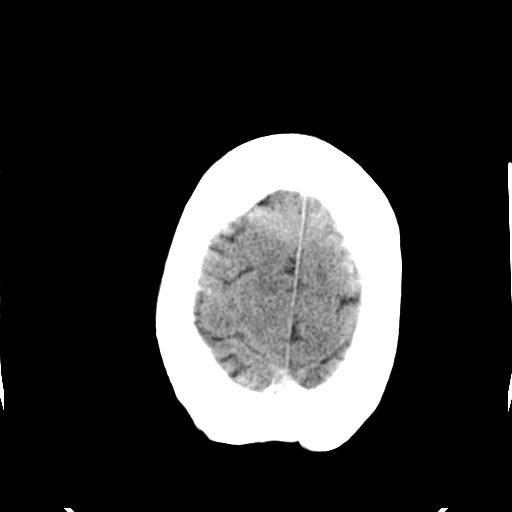
[im 29/36  brain]
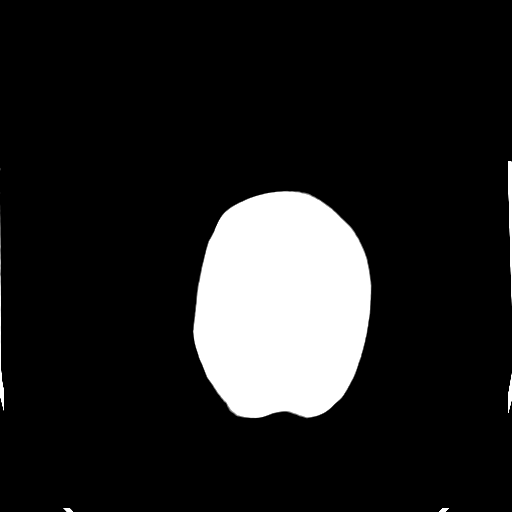
[im 31/36  brain]
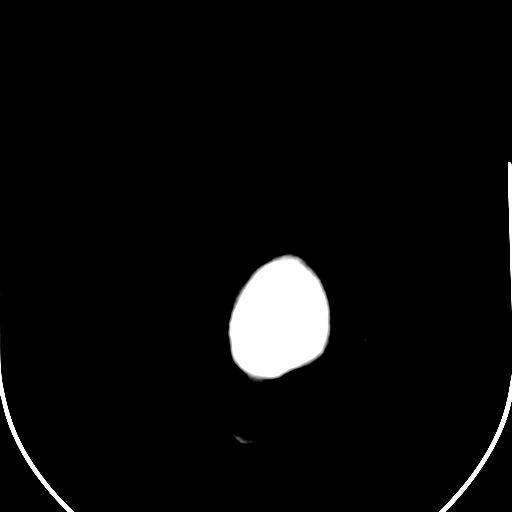
[im 31/36  bone]
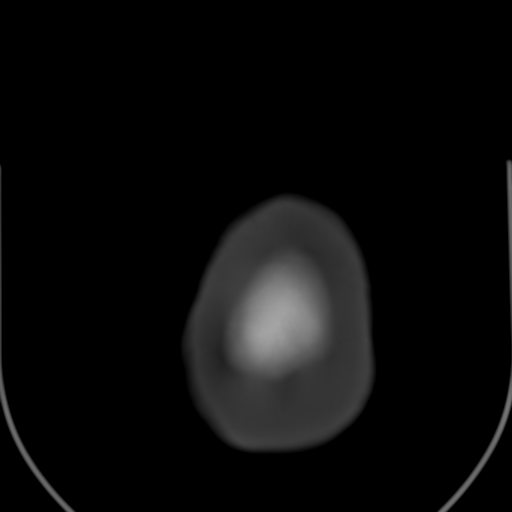
[im 33/36  brain]
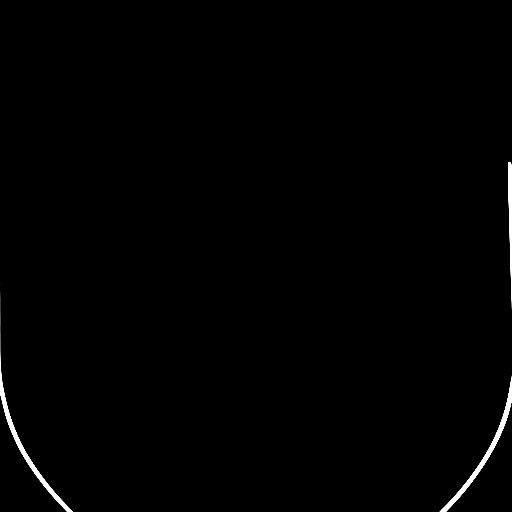

[14 of 30 positions shown; findings below may reference images not displayed]

FINDINGS: There is extensive motion artifact although we repeated the study.
There is no acute intracranial hemorrhage or infarction or mass
lesion. There is extensive encephalomalacia in the right posterior
temporal and parietal region from a remote infarct, unchanged. There
are also small old right cerebellar hemisphere infarcts. There is no
ventricular dilatation. No osseous abnormality.
IMPRESSION: No acute intracranial abnormalities. Old right parietal infarct. Old
right cerebellar infarcts.

## 2015-01-20 ENCOUNTER — Telehealth: Payer: Self-pay | Admitting: Family Medicine

## 2015-01-27 ENCOUNTER — Ambulatory Visit: Payer: Medicare Other | Admitting: Family Medicine

## 2015-01-28 ENCOUNTER — Encounter: Payer: Self-pay | Admitting: Family Medicine

## 2015-02-04 ENCOUNTER — Other Ambulatory Visit: Payer: Self-pay | Admitting: Family Medicine

## 2015-02-06 ENCOUNTER — Telehealth: Payer: Self-pay | Admitting: Family Medicine

## 2015-02-06 MED ORDER — LEVETIRACETAM 750 MG PO TABS: 750.0000 mg | ORAL_TABLET | Freq: Two times a day (BID) | ORAL | Status: DC

## 2015-02-06 MED ORDER — ESOMEPRAZOLE MAGNESIUM 40 MG PO CPDR: DELAYED_RELEASE_CAPSULE | ORAL | Status: DC

## 2015-03-03 ENCOUNTER — Ambulatory Visit (INDEPENDENT_AMBULATORY_CARE_PROVIDER_SITE_OTHER): Payer: Medicare Other | Admitting: Family Medicine

## 2015-03-03 ENCOUNTER — Encounter: Payer: Self-pay | Admitting: Family Medicine

## 2015-03-03 VITALS — BP 136/75 | HR 81 | Temp 97.2°F | Ht 61.0 in | Wt 150.4 lb

## 2015-03-03 DIAGNOSIS — F0150 Vascular dementia without behavioral disturbance: Secondary | ICD-10-CM | POA: Diagnosis not present

## 2015-03-03 DIAGNOSIS — G2581 Restless legs syndrome: Secondary | ICD-10-CM | POA: Diagnosis not present

## 2015-03-03 DIAGNOSIS — Z23 Encounter for immunization: Secondary | ICD-10-CM

## 2015-03-03 DIAGNOSIS — R7303 Prediabetes: Secondary | ICD-10-CM | POA: Diagnosis not present

## 2015-03-03 DIAGNOSIS — R569 Unspecified convulsions: Secondary | ICD-10-CM | POA: Diagnosis not present

## 2015-03-03 DIAGNOSIS — E785 Hyperlipidemia, unspecified: Secondary | ICD-10-CM | POA: Diagnosis not present

## 2015-03-03 DIAGNOSIS — I69354 Hemiplegia and hemiparesis following cerebral infarction affecting left non-dominant side: Secondary | ICD-10-CM

## 2015-03-03 DIAGNOSIS — I10 Essential (primary) hypertension: Secondary | ICD-10-CM | POA: Diagnosis not present

## 2015-03-03 DIAGNOSIS — K2100 Gastro-esophageal reflux disease with esophagitis, without bleeding: Secondary | ICD-10-CM

## 2015-03-03 MED ORDER — SIMVASTATIN 20 MG PO TABS: 20.0000 mg | ORAL_TABLET | Freq: Every day | ORAL | Status: DC

## 2015-03-03 MED ORDER — ESOMEPRAZOLE MAGNESIUM 40 MG PO CPDR: DELAYED_RELEASE_CAPSULE | ORAL | Status: DC

## 2015-03-03 MED ORDER — LISINOPRIL-HYDROCHLOROTHIAZIDE 20-12.5 MG PO TABS: 1.0000 | ORAL_TABLET | Freq: Every day | ORAL | Status: DC

## 2015-03-03 MED ORDER — LEVETIRACETAM 750 MG PO TABS: 750.0000 mg | ORAL_TABLET | Freq: Two times a day (BID) | ORAL | Status: DC

## 2015-03-03 NOTE — Patient Instructions (Addendum)
Try drinking 1-2 bottles of mineral water through the day. It has just enough quinine in it that it may reduce your nighttime leg cramps. If they get bad enough there is a prescription medicine I can give you. You can also try to get quinine pills over-the-counter. Sometimes they're available sometimes not.   Merry Christmas

## 2015-03-03 NOTE — Progress Notes (Signed)
Subjective:  Patient ID: [name], female    DOB: [birth year]  Age: 69 y.o. MRN: [id number]  CC: Hypertension; Hyperlipidemia; and Gastroesophageal Reflux   HPI [name] presents for  follow-up of hypertension. Patient has no history of headache chest pain or shortness of breath or recent cough. Patient also denies symptoms of TIA such as numbness weakness lateralizing. Patient checks  blood pressure at home and has not had any elevated readings recently. Patient denies side effects from his medication. States taking it regularly.  Patient also  in for follow-up of elevated cholesterol. Doing well without complaints on current medication. Denies side effects of statin including myalgia and arthralgia and nausea. Also in today for liver function testing. Currently no chest pain, shortness of breath or other cardiovascular related symptoms noted.  Patient has been noted to have borderline elevated hemoglobin A1c of 6.3 recently. However she has never been diagnosed or treated for diabetes. She is in today for follow-up of prediabetes.  Patient in for follow-up of GERD. Currently mildly symptomatic with occasional heartburn or indigestion taking  PPI daily. There is no chest pain. No hematemesis and no melena. No dysphagia or choking. Onset is remote. Progression is stable. Complicating factors, none.   History [name] has a past medical history of Hypertension; GERD (gastroesophageal reflux disease); Stroke [facility]) (May 2013); Hyperlipidemia; Dementia, vascular; and Seizure disorder ([place]).   She has past surgical history that includes Bunionectomy; Colonoscopy (10/31/2011); Esophagogastroduodenoscopy (10/31/2011); and Hemorrhoid surgery (01/31/2012).   Her family history includes Diabetes in her mother; Hyperlipidemia in her father and mother; Hypertension in her father and mother; Stroke in her mother. There is no history of Colon cancer.She reports that she has quit smoking. Her smoking use  included Cigarettes. She has a 50 pack-year smoking history. She has never used smokeless tobacco. She reports that she does not drink alcohol or use illicit drugs.  Current Outpatient Prescriptions on File Prior to Visit  Medication Sig Dispense Refill  . aspirin 325 MG EC tablet Take 325 mg by mouth daily.    . calcium carbonate (TUMS) 500 MG chewable tablet Chew 1 tablet by mouth 3 (three) times daily as needed for indigestion or heartburn.     No current facility-administered medications on file prior to visit.    ROS Review of Systems  Constitutional: Negative for fever, activity change and appetite change.  HENT: Negative for congestion, rhinorrhea and sore throat.   Eyes: Negative for visual disturbance.  Respiratory: Negative for cough and shortness of breath.   Cardiovascular: Negative for chest pain and palpitations.  Gastrointestinal: Negative for nausea, abdominal pain and diarrhea.  Genitourinary: Negative for dysuria.  Musculoskeletal: Positive for myalgias (legs cramp at night). Negative for arthralgias.    Objective:  BP 136/75 mmHg  Pulse 81  Temp(Src) 97.2 F (36.2 C) (Oral)  Ht 5' 1"  (1.549 m)  Wt 150 lb 6.4 oz (68.221 kg)  BMI 28.43 kg/m2  SpO2 100%  BP Readings from Last 3 Encounters:  03/03/15 136/75  12/04/14 122/70  10/13/14 122/66    Wt Readings from Last 3 Encounters:  03/03/15 150 lb 6.4 oz (68.221 kg)  12/04/14 152 lb (68.947 kg)  10/13/14 153 lb 9.6 oz (69.673 kg)     Physical Exam  Lab Results  Component Value Date   HGBA1C 6.3 07/01/2014   HGBA1C 6.0* 04/07/2013   HGBA1C 5.9* 08/07/2011    Lab Results  Component Value Date   WBC 5.6 12/04/2014   HGB 12.2  12/04/2014   HCT 36.0 12/04/2014   PLT 276 12/04/2014   GLUCOSE 131* 12/04/2014   CHOL 155 07/01/2014   TRIG 221* 07/01/2014   HDL 67 07/01/2014   LDLCALC 56 04/08/2013   ALT 12* 12/04/2014   AST 16 12/04/2014   NA 142 12/04/2014   K 3.8 12/04/2014   CL 104 12/04/2014    CREATININE 1.50* 12/04/2014   BUN 34* 12/04/2014   CO2 27 12/04/2014   TSH 1.340 07/01/2014   INR 1.08 12/04/2014   HGBA1C 6.3 07/01/2014    Ct Head Wo Contrast  12/04/2014  CLINICAL DATA:  Right foot numbness began yesterday. EXAM: CT HEAD WITHOUT CONTRAST TECHNIQUE: Contiguous axial images were obtained from the base of the skull through the vertex without intravenous contrast. COMPARISON:  03/01/2014 FINDINGS: There is no evidence of mass effect, midline shift, or extra-axial fluid collections. There is no evidence of a space-occupying lesion or intracranial hemorrhage. There is no evidence of a cortical-based area of acute infarction. There is an old right MCA territory infarct. There is periventricular white matter low attenuation likely secondary to microangiopathy. The ventricles and sulci are appropriate for the patient's age. The basal cisterns are patent. Visualized portions of the orbits are unremarkable. There is mild right ethmoid and frontal sinus mucosal thickening. The osseous structures are unremarkable. IMPRESSION: No acute intracranial pathology. Electronically Signed   By: Kathreen Devoid   On: 12/04/2014 12:49    Assessment & Plan:   [name] was seen today for hypertension, hyperlipidemia and gastroesophageal reflux.  Diagnoses and all orders for this visit:  Essential hypertension -     lisinopril-hydrochlorothiazide (ZESTORETIC) 20-12.5 MG tablet; Take 1 tablet by mouth daily. -     CMP14+EGFR  Hyperlipidemia LDL goal <100 -     simvastatin (ZOCOR) 20 MG tablet; Take 1 tablet (20 mg total) by mouth daily at 6 PM. -     CMP14+EGFR -     Lipid panel  Gastroesophageal reflux disease with esophagitis -     CBC with Differential/Platelet -     CMP14+EGFR  Seizure (HCC) -     CMP14+EGFR  Vascular dementia, without behavioral disturbance -     CMP14+EGFR  Hemiparesis affecting left side as late effect of stroke (HCC) -     CMP14+EGFR  Restless leg syndrome,  uncontrolled -     CMP14+EGFR -     Magnesium -     Phosphorus  Prediabetes -     CMP14+EGFR -     POCT glycosylated hemoglobin (Hb A1C)  Other orders -     esomeprazole (NEXIUM) 40 MG capsule; TAKE ONE CAPSULE BY MOUTH ONCE DAILY on an empty stomach -     levETIRAcetam (KEPPRA) 750 MG tablet; Take 1 tablet (750 mg total) by mouth 2 (two) times daily.   I have changed [name]'s lisinopril-hydrochlorothiazide. I am also having her maintain her aspirin, calcium carbonate, esomeprazole, levETIRAcetam, and simvastatin.  Meds ordered this encounter  Medications  . esomeprazole (NEXIUM) 40 MG capsule    Sig: TAKE ONE CAPSULE BY MOUTH ONCE DAILY on an empty stomach    Dispense:  90 capsule    Refill:  4  . levETIRAcetam (KEPPRA) 750 MG tablet    Sig: Take 1 tablet (750 mg total) by mouth 2 (two) times daily.    Dispense:  180 tablet    Refill:  1  . lisinopril-hydrochlorothiazide (ZESTORETIC) 20-12.5 MG tablet    Sig: Take 1 tablet by mouth daily.  Dispense:  90 tablet    Refill:  3  . simvastatin (ZOCOR) 20 MG tablet    Sig: Take 1 tablet (20 mg total) by mouth daily at 6 PM.    Dispense:  90 tablet    Refill:  3     Follow-up: Return in about 1 month (around 04/03/2015).  Claretta Fraise, M.D.

## 2015-03-04 ENCOUNTER — Other Ambulatory Visit: Payer: Self-pay | Admitting: Family Medicine

## 2015-03-04 LAB — LIPID PANEL
Chol/HDL Ratio: 5 ratio units — ABNORMAL HIGH (ref 0.0–4.4)
Cholesterol, Total: 265 mg/dL — ABNORMAL HIGH (ref 100–199)
HDL: 53 mg/dL (ref >39)
TRIGLYCERIDES: 412 mg/dL — AB (ref 0–149)

## 2015-03-04 LAB — CBC WITH DIFFERENTIAL/PLATELET
Basophils Absolute: 0 10*3/uL (ref 0.0–0.2)
Basos: 0 %
EOS (ABSOLUTE): 0.1 10*3/uL (ref 0.0–0.4)
EOS: 1 %
HEMATOCRIT: 34.5 % (ref 34.0–46.6)
HEMOGLOBIN: 11.3 g/dL (ref 11.1–15.9)
IMMATURE GRANULOCYTES: 0 %
Immature Grans (Abs): 0 10*3/uL (ref 0.0–0.1)
Lymphocytes Absolute: 2.7 10*3/uL (ref 0.7–3.1)
Lymphs: 50 %
MCH: 27.8 pg (ref 26.6–33.0)
MCHC: 32.8 g/dL (ref 31.5–35.7)
MCV: 85 fL (ref 79–97)
MONOCYTES: 6 %
Monocytes Absolute: 0.3 10*3/uL (ref 0.1–0.9)
NEUTROS PCT: 43 %
Neutrophils Absolute: 2.3 10*3/uL (ref 1.4–7.0)
Platelets: 319 10*3/uL (ref 150–379)
RBC: 4.06 x10E6/uL (ref 3.77–5.28)
RDW: 14.7 % (ref 12.3–15.4)
WBC: 5.4 10*3/uL (ref 3.4–10.8)

## 2015-03-04 LAB — CMP14+EGFR
ALBUMIN: 4.5 g/dL (ref 3.5–4.8)
ALT: 11 IU/L (ref 0–32)
AST: 13 IU/L (ref 0–40)
Albumin/Globulin Ratio: 1.9 (ref 1.1–2.5)
Alkaline Phosphatase: 95 IU/L (ref 39–117)
BUN / CREAT RATIO: 22 (ref 11–26)
BUN: 20 mg/dL (ref 8–27)
Bilirubin Total: <0.2 mg/dL (ref 0.0–1.2)
CALCIUM: 9.8 mg/dL (ref 8.7–10.3)
CO2: 26 mmol/L (ref 18–29)
CREATININE: 0.93 mg/dL (ref 0.57–1.00)
Chloride: 102 mmol/L (ref 97–106)
GFR calc Af Amer: 72 mL/min/{1.73_m2} (ref >59)
GFR, EST NON AFRICAN AMERICAN: 62 mL/min/{1.73_m2} (ref >59)
GLOBULIN, TOTAL: 2.4 g/dL (ref 1.5–4.5)
Glucose: 84 mg/dL (ref 65–99)
Potassium: 4.6 mmol/L (ref 3.5–5.2)
SODIUM: 142 mmol/L (ref 136–144)
Total Protein: 6.9 g/dL (ref 6.0–8.5)

## 2015-03-04 LAB — MAGNESIUM: MAGNESIUM: 1.8 mg/dL (ref 1.6–2.3)

## 2015-03-04 LAB — PHOSPHORUS: PHOSPHORUS: 3.3 mg/dL (ref 2.5–4.5)

## 2015-03-04 MED ORDER — ROSUVASTATIN CALCIUM 20 MG PO TABS: 20.0000 mg | ORAL_TABLET | Freq: Every day | ORAL | Status: DC

## 2015-03-09 ENCOUNTER — Telehealth: Payer: Self-pay

## 2015-03-09 NOTE — Telephone Encounter (Signed)
Insurance prior authorized Esomeprazole magnesium through 12.31.17

## 2015-04-28 ENCOUNTER — Ambulatory Visit (INDEPENDENT_AMBULATORY_CARE_PROVIDER_SITE_OTHER): Payer: Medicare Other | Admitting: Family Medicine

## 2015-04-28 ENCOUNTER — Encounter: Payer: Self-pay | Admitting: Family Medicine

## 2015-04-28 VITALS — BP 132/73 | HR 66 | Temp 96.9°F | Ht 61.0 in | Wt 151.4 lb

## 2015-04-28 DIAGNOSIS — K2100 Gastro-esophageal reflux disease with esophagitis, without bleeding: Secondary | ICD-10-CM

## 2015-04-28 DIAGNOSIS — E785 Hyperlipidemia, unspecified: Secondary | ICD-10-CM | POA: Diagnosis not present

## 2015-04-28 DIAGNOSIS — I10 Essential (primary) hypertension: Secondary | ICD-10-CM

## 2015-04-28 MED ORDER — LISINOPRIL-HYDROCHLOROTHIAZIDE 20-12.5 MG PO TABS: 1.0000 | ORAL_TABLET | Freq: Every day | ORAL | Status: DC

## 2015-04-28 MED ORDER — LEVETIRACETAM 750 MG PO TABS: 750.0000 mg | ORAL_TABLET | Freq: Two times a day (BID) | ORAL | Status: DC

## 2015-04-28 MED ORDER — ESOMEPRAZOLE MAGNESIUM 40 MG PO CPDR: DELAYED_RELEASE_CAPSULE | ORAL | Status: DC

## 2015-04-28 NOTE — Progress Notes (Signed)
Subjective:  Patient ID: [name], female    DOB: August 30, 1945  Age: 70 y.o. MRN: [id number]  CC: Hypertension   HPI [name] presents for follow-up of elevated cholesterol. Doing well without complaints on current medication. Denies side effects of statin including myalgia and arthralgia and nausea. Also in today for liver function testing. Currently no chest pain, shortness of breath or other cardiovascular related symptoms noted.   History [name] has a past medical history of Hypertension; GERD (gastroesophageal reflux disease); Stroke [facility]) (May 2013); Hyperlipidemia; Dementia, vascular; and Seizure disorder ([place]).   She has past surgical history that includes Bunionectomy; Colonoscopy (10/31/2011); Esophagogastroduodenoscopy (10/31/2011); and Hemorrhoid surgery (01/31/2012).   Her family history includes Diabetes in her mother; Hyperlipidemia in her father and mother; Hypertension in her father and mother; Stroke in her mother. There is no history of Colon cancer.She reports that she has quit smoking. Her smoking use included Cigarettes. She has a 50 pack-year smoking history. She has never used smokeless tobacco. She reports that she does not drink alcohol or use illicit drugs.  Current Outpatient Prescriptions on File Prior to Visit  Medication Sig Dispense Refill  . aspirin 325 MG EC tablet Take 325 mg by mouth daily.    . calcium carbonate (TUMS) 500 MG chewable tablet Chew 1 tablet by mouth 3 (three) times daily as needed for indigestion or heartburn.    . rosuvastatin (CRESTOR) 20 MG tablet Take 1 tablet (20 mg total) by mouth daily. 90 tablet 3   No current facility-administered medications on file prior to visit.    ROS Review of Systems  Constitutional: Negative for fever, activity change and appetite change.  HENT: Negative for congestion, rhinorrhea and sore throat.   Eyes: Negative for visual disturbance.  Respiratory: Negative for cough and shortness of breath.     Cardiovascular: Negative for chest pain and palpitations.  Gastrointestinal: Negative for nausea, abdominal pain and diarrhea.  Genitourinary: Negative for dysuria.  Musculoskeletal: Negative for myalgias and arthralgias.    Objective:  BP 132/73 mmHg  Pulse 66  Temp(Src) 96.9 F (36.1 C) (Oral)  Ht 5' 1" (1.549 m)  Wt 151 lb 6.4 oz (68.675 kg)  BMI 28.62 kg/m2  BP Readings from Last 3 Encounters:  04/28/15 132/73  03/03/15 136/75  12/04/14 122/70    Wt Readings from Last 3 Encounters:  04/28/15 151 lb 6.4 oz (68.675 kg)  03/03/15 150 lb 6.4 oz (68.221 kg)  12/04/14 152 lb (68.947 kg)     Physical Exam  Constitutional: She is oriented to person, place, and time. She appears well-developed and well-nourished. No distress.  HENT:  Head: Normocephalic and atraumatic.  Right Ear: External ear normal.  Left Ear: External ear normal.  Nose: Nose normal.  Mouth/Throat: Oropharynx is clear and moist.  Eyes: Conjunctivae and EOM are normal. Pupils are equal, round, and reactive to light.  Neck: Normal range of motion. Neck supple. No thyromegaly present.  Cardiovascular: Normal rate, regular rhythm and normal heart sounds.   No murmur heard. Pulmonary/Chest: Effort normal and breath sounds normal. No respiratory distress. She has no wheezes. She has no rales.  Abdominal: Soft. Bowel sounds are normal. She exhibits no distension. There is no tenderness.  Lymphadenopathy:    She has no cervical adenopathy.  Neurological: She is alert and oriented to person, place, and time. She has normal reflexes.  Skin: Skin is warm and dry.  Psychiatric: She has a normal mood and affect. Her behavior is normal. Judgment  and thought content normal.    Lab Results  Component Value Date   HGBA1C 6.3 07/01/2014   HGBA1C 6.0* 04/07/2013   HGBA1C 5.9* 08/07/2011    Lab Results  Component Value Date   WBC 5.9 04/28/2015   HGB 12.2 12/04/2014   HCT 33.3* 04/28/2015   PLT 276 04/28/2015    GLUCOSE 99 04/28/2015   CHOL 131 04/28/2015   TRIG 223* 04/28/2015   HDL 75 04/28/2015   LDLCALC 11 04/28/2015   ALT 13 04/28/2015   AST 19 04/28/2015   NA 142 04/28/2015   K 4.8 04/28/2015   CL 100 04/28/2015   CREATININE 1.05* 04/28/2015   BUN 17 04/28/2015   CO2 27 04/28/2015   TSH 1.340 07/01/2014   INR 1.08 12/04/2014   HGBA1C 6.3 07/01/2014    Ct Head Wo Contrast  12/04/2014  CLINICAL DATA:  Right foot numbness began yesterday. EXAM: CT HEAD WITHOUT CONTRAST TECHNIQUE: Contiguous axial images were obtained from the base of the skull through the vertex without intravenous contrast. COMPARISON:  03/01/2014 FINDINGS: There is no evidence of mass effect, midline shift, or extra-axial fluid collections. There is no evidence of a space-occupying lesion or intracranial hemorrhage. There is no evidence of a cortical-based area of acute infarction. There is an old right MCA territory infarct. There is periventricular white matter low attenuation likely secondary to microangiopathy. The ventricles and sulci are appropriate for the patient's age. The basal cisterns are patent. Visualized portions of the orbits are unremarkable. There is mild right ethmoid and frontal sinus mucosal thickening. The osseous structures are unremarkable. IMPRESSION: No acute intracranial pathology. Electronically Signed   By: Kathreen Devoid   On: 12/04/2014 12:49    Assessment & Plan:   [name] was seen today for hypertension.  Diagnoses and all orders for this visit:  Essential hypertension -     lisinopril-hydrochlorothiazide (ZESTORETIC) 20-12.5 MG tablet; Take 1 tablet by mouth daily. -     CBC with Differential/Platelet -     CMP14+EGFR -     Lipid panel -     Levetiracetam level -     Ambulatory referral to Podiatry  Hyperlipidemia LDL goal <100  Gastroesophageal reflux disease with esophagitis  Other orders -     esomeprazole (NEXIUM) 40 MG capsule; TAKE ONE CAPSULE BY MOUTH ONCE DAILY on an empty  stomach -     levETIRAcetam (KEPPRA) 750 MG tablet; Take 1 tablet (750 mg total) by mouth 2 (two) times daily.   I have discontinued [name]'s simvastatin. I am also having her maintain her aspirin, calcium carbonate, rosuvastatin, esomeprazole, levETIRAcetam, and lisinopril-hydrochlorothiazide.  Meds ordered this encounter  Medications  . esomeprazole (NEXIUM) 40 MG capsule    Sig: TAKE ONE CAPSULE BY MOUTH ONCE DAILY on an empty stomach    Dispense:  90 capsule    Refill:  4  . levETIRAcetam (KEPPRA) 750 MG tablet    Sig: Take 1 tablet (750 mg total) by mouth 2 (two) times daily.    Dispense:  180 tablet    Refill:  1  . lisinopril-hydrochlorothiazide (ZESTORETIC) 20-12.5 MG tablet    Sig: Take 1 tablet by mouth daily.    Dispense:  90 tablet    Refill:  3     Follow-up: Return in about 3 months (around 07/27/2015).  Claretta Fraise, M.D.

## 2015-05-01 ENCOUNTER — Telehealth: Payer: Self-pay | Admitting: Family Medicine

## 2015-05-01 LAB — LIPID PANEL
CHOLESTEROL TOTAL: 131 mg/dL (ref 100–199)
Chol/HDL Ratio: 1.7 ratio units (ref 0.0–4.4)
HDL: 75 mg/dL (ref >39)
LDL Calculated: 11 mg/dL (ref 0–99)
TRIGLYCERIDES: 223 mg/dL — AB (ref 0–149)
VLDL Cholesterol Cal: 45 mg/dL — ABNORMAL HIGH (ref 5–40)

## 2015-05-01 LAB — CMP14+EGFR
A/G RATIO: 2 (ref 1.1–2.5)
ALBUMIN: 4.5 g/dL (ref 3.5–4.8)
ALT: 13 IU/L (ref 0–32)
AST: 19 IU/L (ref 0–40)
Alkaline Phosphatase: 72 IU/L (ref 39–117)
BUN / CREAT RATIO: 16 (ref 11–26)
BUN: 17 mg/dL (ref 8–27)
CHLORIDE: 100 mmol/L (ref 96–106)
CO2: 27 mmol/L (ref 18–29)
Calcium: 9.7 mg/dL (ref 8.7–10.3)
Creatinine, Ser: 1.05 mg/dL — ABNORMAL HIGH (ref 0.57–1.00)
GFR calc non Af Amer: 54 mL/min/{1.73_m2} — ABNORMAL LOW (ref >59)
GFR, EST AFRICAN AMERICAN: 62 mL/min/{1.73_m2} (ref >59)
GLOBULIN, TOTAL: 2.2 g/dL (ref 1.5–4.5)
GLUCOSE: 99 mg/dL (ref 65–99)
Potassium: 4.8 mmol/L (ref 3.5–5.2)
SODIUM: 142 mmol/L (ref 134–144)
TOTAL PROTEIN: 6.7 g/dL (ref 6.0–8.5)

## 2015-05-01 LAB — LEVETIRACETAM LEVEL: Levetiracetam Lvl: 1.2 ug/mL — ABNORMAL LOW (ref 10.0–40.0)

## 2015-05-01 LAB — CBC WITH DIFFERENTIAL/PLATELET
BASOS: 0 %
Basophils Absolute: 0 10*3/uL (ref 0.0–0.2)
EOS (ABSOLUTE): 0.1 10*3/uL (ref 0.0–0.4)
EOS: 2 %
HEMATOCRIT: 33.3 % — AB (ref 34.0–46.6)
HEMOGLOBIN: 11.2 g/dL (ref 11.1–15.9)
IMMATURE GRANS (ABS): 0 10*3/uL (ref 0.0–0.1)
Immature Granulocytes: 0 %
LYMPHS ABS: 2.8 10*3/uL (ref 0.7–3.1)
LYMPHS: 48 %
MCH: 28.6 pg (ref 26.6–33.0)
MCHC: 33.6 g/dL (ref 31.5–35.7)
MCV: 85 fL (ref 79–97)
MONOCYTES: 6 %
Monocytes Absolute: 0.4 10*3/uL (ref 0.1–0.9)
NEUTROS ABS: 2.6 10*3/uL (ref 1.4–7.0)
Neutrophils: 44 %
Platelets: 276 10*3/uL (ref 150–379)
RBC: 3.92 x10E6/uL (ref 3.77–5.28)
RDW: 14 % (ref 12.3–15.4)
WBC: 5.9 10*3/uL (ref 3.4–10.8)

## 2015-05-01 MED ORDER — LEVETIRACETAM 750 MG PO TABS: ORAL_TABLET | ORAL | Status: DC

## 2015-05-01 NOTE — Telephone Encounter (Signed)
Results given.

## 2015-05-01 NOTE — Telephone Encounter (Signed)
Increase dose to TID. Check level and CMP in 2weeks

## 2015-05-01 NOTE — Telephone Encounter (Signed)
Stp's daughter who states she hadn't missed any doses prior to having her blood drawn, would you like to increase her dose?

## 2015-05-18 ENCOUNTER — Other Ambulatory Visit: Payer: Self-pay | Admitting: *Deleted

## 2015-05-18 DIAGNOSIS — Z1231 Encounter for screening mammogram for malignant neoplasm of breast: Secondary | ICD-10-CM

## 2015-08-02 ENCOUNTER — Ambulatory Visit: Payer: Medicare Other | Admitting: Orthopedic Surgery

## 2015-10-17 ENCOUNTER — Telehealth: Payer: Self-pay | Admitting: Family Medicine

## 2015-10-27 ENCOUNTER — Ambulatory Visit (INDEPENDENT_AMBULATORY_CARE_PROVIDER_SITE_OTHER): Payer: Medicare Other | Admitting: Family Medicine

## 2015-10-27 ENCOUNTER — Encounter: Payer: Self-pay | Admitting: Family Medicine

## 2015-10-27 VITALS — BP 118/66 | HR 85 | Temp 96.9°F | Ht 61.0 in | Wt 148.2 lb

## 2015-10-27 DIAGNOSIS — K2100 Gastro-esophageal reflux disease with esophagitis, without bleeding: Secondary | ICD-10-CM

## 2015-10-27 DIAGNOSIS — I10 Essential (primary) hypertension: Secondary | ICD-10-CM | POA: Diagnosis not present

## 2015-10-27 DIAGNOSIS — Z780 Asymptomatic menopausal state: Secondary | ICD-10-CM | POA: Diagnosis not present

## 2015-10-27 DIAGNOSIS — G2581 Restless legs syndrome: Secondary | ICD-10-CM

## 2015-10-27 DIAGNOSIS — E785 Hyperlipidemia, unspecified: Secondary | ICD-10-CM

## 2015-10-27 DIAGNOSIS — R7303 Prediabetes: Secondary | ICD-10-CM | POA: Diagnosis not present

## 2015-10-27 LAB — BAYER DCA HB A1C WAIVED: HB A1C: 6.5 % (ref <7.0)

## 2015-10-27 NOTE — Progress Notes (Signed)
Please contact the patient regarding:Your diabetes test came back positive. You have a mild case of diabetes. I would like for you to see our Diabetes nurse. You shouldalso take a medication for that called metformin. I have sent that prescription to your pharmacy  Kay: Please arrange appt. For pt. With [employer], [place]

## 2015-10-27 NOTE — Progress Notes (Signed)
 Subjective:  Patient ID: [name], female    DOB: 12/07/1945  Age: 70 y.o. MRN: [id number]  CC: Hypertension (6 mth rck); Gastroesophageal Reflux; and Hyperlipidemia   HPI Dartha Vacha presents for  follow-up of hypertension. Patient has no history of headache chest pain or shortness of breath or recent cough. Patient also denies symptoms of TIA such as numbness weakness lateralizing. Patient checks  blood pressure at home and has not had any elevated readings recently. Patient denies side effects from medication. States taking it regularly.  Patient in for follow-up of elevated cholesterol. Doing well without complaints on current medication. Denies side effects of statin including myalgia and arthralgia and nausea. Also in today for liver function testing. Currently no chest pain, shortness of breath or other cardiovascular related symptoms noted.  Patient in for follow-up of GERD. Currently asymptomatic taking  PPI daily. There is no chest pain or heartburn. No hematemesis and no melena. No dysphagia or choking. Onset is remote. Progression is stable. Complicating factors, none.  Elevated glucose in past. Not checking at home. Denies polyuria, polydipsia. Not following a diabetic, carb controlled diet.   History Jasara has a past medical history of Dementia, vascular; GERD (gastroesophageal reflux disease); Hyperlipidemia; Hypertension; Seizure disorder ([facility]); and Stroke ([facility]) (May 2013).   She has a past surgical history that includes Bunionectomy; Colonoscopy (10/31/2011); Esophagogastroduodenoscopy (10/31/2011); and Hemorrhoid surgery (01/31/2012).   Her family history includes Diabetes in her mother; Hyperlipidemia in her father and mother; Hypertension in her father and mother; Stroke in her mother.She reports that she has quit smoking. Her smoking use included Cigarettes. She has a 50.00 pack-year smoking history. She has never used smokeless tobacco. She reports that she does not drink  alcohol or use drugs.  Current Outpatient Prescriptions on File Prior to Visit  Medication Sig Dispense Refill  . aspirin 325 MG EC tablet Take 325 mg by mouth daily.    . calcium carbonate (TUMS) 500 MG chewable tablet Chew 1 tablet by mouth 3 (three) times daily as needed for indigestion or heartburn.    . esomeprazole (NEXIUM) 40 MG capsule TAKE ONE CAPSULE BY MOUTH ONCE DAILY on an empty stomach 90 capsule 4  . levETIRAcetam (KEPPRA) 750 MG tablet One each morning and two each evening. 270 tablet 1  . lisinopril-hydrochlorothiazide (ZESTORETIC) 20-12.5 MG tablet Take 1 tablet by mouth daily. 90 tablet 3  . rosuvastatin (CRESTOR) 20 MG tablet Take 1 tablet (20 mg total) by mouth daily. 90 tablet 3   No current facility-administered medications on file prior to visit.     ROS Review of Systems  Constitutional: Negative for activity change, appetite change and fever.  HENT: Negative for congestion, rhinorrhea and sore throat.   Eyes: Negative for visual disturbance.  Respiratory: Negative for cough and shortness of breath.   Cardiovascular: Negative for chest pain and palpitations.  Gastrointestinal: Negative for abdominal pain, diarrhea and nausea.  Genitourinary: Negative for dysuria.  Musculoskeletal: Negative for arthralgias and myalgias.    Objective:  BP 118/66 (BP Location: Right Arm, Patient Position: Sitting, Cuff Size: Normal)   Pulse 85   Temp (!) 96.9 F (36.1 C) (Oral)   Ht 5' 1" (1.549 m)   Wt 148 lb 3.2 oz (67.2 kg)   SpO2 99%   BMI 28.00 kg/m   BP Readings from Last 3 Encounters:  10/27/15 118/66  04/28/15 132/73  03/03/15 136/75    Wt Readings from Last 3 Encounters:  10/27/15 148 lb 3.2 oz (  67.2 kg)  04/28/15 151 lb 6.4 oz (68.7 kg)  03/03/15 150 lb 6.4 oz (68.2 kg)     Physical Exam  Constitutional: She is oriented to person, place, and time. She appears well-developed and well-nourished. No distress.  HENT:  Head: Normocephalic and atraumatic.   Right Ear: External ear normal.  Left Ear: External ear normal.  Nose: Nose normal.  Mouth/Throat: Oropharynx is clear and moist.  Eyes: Conjunctivae and EOM are normal. Pupils are equal, round, and reactive to light.  Neck: Normal range of motion. Neck supple. No thyromegaly present.  Cardiovascular: Normal rate, regular rhythm and normal heart sounds.   No murmur heard. Pulmonary/Chest: Effort normal and breath sounds normal. No respiratory distress. She has no wheezes. She has no rales.  Abdominal: Soft. Bowel sounds are normal. She exhibits no distension. There is no tenderness.  Lymphadenopathy:    She has no cervical adenopathy.  Neurological: She is alert and oriented to person, place, and time. She has normal reflexes.  Skin: Skin is warm and dry.  Psychiatric: She has a normal mood and affect. Her behavior is normal. Judgment and thought content normal.     Lab Results  Component Value Date   WBC 6.3 10/27/2015   HGB 12.2 12/04/2014   HCT 33.7 (L) 10/27/2015   PLT 291 10/27/2015   GLUCOSE 103 (H) 10/27/2015   CHOL 131 04/28/2015   TRIG 223 (H) 04/28/2015   HDL 75 04/28/2015   LDLCALC 11 04/28/2015   ALT 9 10/27/2015   AST 16 10/27/2015   NA 140 10/27/2015   K 4.2 10/27/2015   CL 99 10/27/2015   CREATININE 1.11 (H) 10/27/2015   BUN 22 10/27/2015   CO2 25 10/27/2015   TSH 1.340 07/01/2014   INR 1.08 12/04/2014   HGBA1C 6.3 07/01/2014    Ct Head Wo Contrast  Result Date: 12/04/2014 CLINICAL DATA:  Right foot numbness began yesterday. EXAM: CT HEAD WITHOUT CONTRAST TECHNIQUE: Contiguous axial images were obtained from the base of the skull through the vertex without intravenous contrast. COMPARISON:  03/01/2014 FINDINGS: There is no evidence of mass effect, midline shift, or extra-axial fluid collections. There is no evidence of a space-occupying lesion or intracranial hemorrhage. There is no evidence of a cortical-based area of acute infarction. There is an old  right MCA territory infarct. There is periventricular white matter low attenuation likely secondary to microangiopathy. The ventricles and sulci are appropriate for the patient's age. The basal cisterns are patent. Visualized portions of the orbits are unremarkable. There is mild right ethmoid and frontal sinus mucosal thickening. The osseous structures are unremarkable. IMPRESSION: No acute intracranial pathology. Electronically Signed   By: Kathreen Devoid   On: 12/04/2014 12:49    Assessment & Plan:   Kadisha was seen today for hypertension, gastroesophageal reflux and hyperlipidemia.  Diagnoses and all orders for this visit:  Gastroesophageal reflux disease with esophagitis -     CMP14+EGFR -     CBC with Differential/Platelet  Essential hypertension -     CMP14+EGFR -     CBC with Differential/Platelet  Hyperlipidemia LDL goal <100 -     CMP14+EGFR -     CBC with Differential/Platelet  Prediabetes -     Bayer DCA Hb A1c Waived -     CMP14+EGFR -     CBC with Differential/Platelet  Restless leg syndrome, uncontrolled -     CMP14+EGFR -     CBC with Differential/Platelet  Post-menopause -  DG Bone Density; Future   I am having Ms. Tessler maintain her aspirin, calcium carbonate, rosuvastatin, esomeprazole, lisinopril-hydrochlorothiazide, and levETIRAcetam.  No orders of the defined types were placed in this encounter.    Follow-up: Return in about 3 months (around 01/27/2016).  Claretta Fraise, M.D.

## 2015-10-28 ENCOUNTER — Other Ambulatory Visit: Payer: Self-pay | Admitting: Family Medicine

## 2015-10-28 LAB — CMP14+EGFR
A/G RATIO: 1.7 (ref 1.2–2.2)
ALBUMIN: 4.6 g/dL (ref 3.5–4.8)
ALT: 9 IU/L (ref 0–32)
AST: 16 IU/L (ref 0–40)
Alkaline Phosphatase: 91 IU/L (ref 39–117)
BILIRUBIN TOTAL: 0.2 mg/dL (ref 0.0–1.2)
BUN / CREAT RATIO: 20 (ref 12–28)
BUN: 22 mg/dL (ref 8–27)
CALCIUM: 9.9 mg/dL (ref 8.7–10.3)
CO2: 25 mmol/L (ref 18–29)
Chloride: 99 mmol/L (ref 96–106)
Creatinine, Ser: 1.11 mg/dL — ABNORMAL HIGH (ref 0.57–1.00)
GFR, EST AFRICAN AMERICAN: 58 mL/min/{1.73_m2} — AB (ref >59)
GFR, EST NON AFRICAN AMERICAN: 50 mL/min/{1.73_m2} — AB (ref >59)
GLUCOSE: 103 mg/dL — AB (ref 65–99)
Globulin, Total: 2.7 g/dL (ref 1.5–4.5)
Potassium: 4.2 mmol/L (ref 3.5–5.2)
Sodium: 140 mmol/L (ref 134–144)
TOTAL PROTEIN: 7.3 g/dL (ref 6.0–8.5)

## 2015-10-28 LAB — CBC WITH DIFFERENTIAL/PLATELET
BASOS: 0 %
Basophils Absolute: 0 10*3/uL (ref 0.0–0.2)
EOS (ABSOLUTE): 0.1 10*3/uL (ref 0.0–0.4)
Eos: 1 %
HEMATOCRIT: 33.7 % — AB (ref 34.0–46.6)
HEMOGLOBIN: 11.2 g/dL (ref 11.1–15.9)
IMMATURE GRANS (ABS): 0 10*3/uL (ref 0.0–0.1)
Immature Granulocytes: 0 %
LYMPHS ABS: 2.6 10*3/uL (ref 0.7–3.1)
LYMPHS: 41 %
MCH: 28.6 pg (ref 26.6–33.0)
MCHC: 33.2 g/dL (ref 31.5–35.7)
MCV: 86 fL (ref 79–97)
MONOCYTES: 8 %
Monocytes Absolute: 0.5 10*3/uL (ref 0.1–0.9)
NEUTROS ABS: 3.2 10*3/uL (ref 1.4–7.0)
Neutrophils: 50 %
Platelets: 291 10*3/uL (ref 150–379)
RBC: 3.91 x10E6/uL (ref 3.77–5.28)
RDW: 14.4 % (ref 12.3–15.4)
WBC: 6.3 10*3/uL (ref 3.4–10.8)

## 2015-10-28 MED ORDER — METFORMIN HCL ER 500 MG PO TB24: 500.0000 mg | ORAL_TABLET | Freq: Every day | ORAL | 2 refills | Status: DC

## 2015-11-01 ENCOUNTER — Telehealth: Payer: Self-pay | Admitting: *Deleted

## 2015-11-01 ENCOUNTER — Telehealth: Payer: Self-pay | Admitting: Family Medicine

## 2015-11-01 NOTE — Telephone Encounter (Signed)
Spoke  with pt's daughter regarding recent HgbA1c Explained normal range and that test does not have to be done fasting. She verbalizes understanding.

## 2015-11-01 NOTE — Telephone Encounter (Signed)
-----   Message from Claretta Fraise, MD sent at 10/27/2015  6:18 PM EDT ----- Please contact the patient regarding:Your diabetes test came back positive. You have a mild case of diabetes. I would like for you to see our Diabetes nurse. You shouldalso take a medication for that called metformin. I have sent that prescription to your pharmacy  Kay: Please arrange appt. For pt. With [employer], [place]

## 2015-11-01 NOTE — Telephone Encounter (Signed)
Pt's daughter notified of results Will call back to schedule appt with Tammy for diabetic education

## 2015-11-27 ENCOUNTER — Ambulatory Visit (INDEPENDENT_AMBULATORY_CARE_PROVIDER_SITE_OTHER): Payer: Medicare Other | Admitting: Pharmacist

## 2015-11-27 ENCOUNTER — Encounter: Payer: Self-pay | Admitting: Pharmacist

## 2015-11-27 DIAGNOSIS — E119 Type 2 diabetes mellitus without complications: Secondary | ICD-10-CM | POA: Insufficient documentation

## 2015-11-27 MED ORDER — BAYER MICROLET LANCETS MISC: 2 refills | Status: DC

## 2015-11-27 MED ORDER — GLUCOSE BLOOD VI STRP: ORAL_STRIP | 2 refills | Status: DC

## 2015-11-27 NOTE — Progress Notes (Signed)
Subjective:    [name] is a 70 y.o. female who presents for an initial evaluation of Type 2 diabetes mellitus.  Her daughter whom she lives with is present with her today.   [name] was initially diagnosed with type 2 DM based on FBG of 103 and A1c of 6.5% 10/27/2015.  She has been taking metformin 500mg  qd since and is tolerating well.     Known diabetic complications: none Cardiovascular risk factors: advanced age (older than 48 for men, 17 for women), diabetes mellitus, dyslipidemia, hypertension and sedentary lifestyle Current diabetic medications include metformin 500mg  qd.   Eye exam current (within one year): no Weight trend: stable Prior visit with CDE: no Current diet: in general, an "unhealthy" diet Current exercise: none Medication Compliance?  Yes  Current monitoring regimen: none Home blood sugar records: n/a Any episodes of hypoglycemia? no  Is She on ACE inhibitor or angiotensin II receptor blocker?  Yes  lisinopril (generic)    The following portions of the patient's history were reviewed and updated as appropriate: allergies, current medications, past family history, past medical history, past social history, past surgical history and problem list.    Objective:    BP 122/68   Pulse 70   Ht 5\' 1"  (1.549 m)   Wt 148 lb 8 oz (67.4 kg)   BMI 28.06 kg/m   A1c = 6.5% (10/27/2015)  Lab Review Glucose (mg/dL)  Date Value  10/27/2015 103 (H)  04/28/2015 99  03/03/2015 84   Glucose, Bld (mg/dL)  Date Value  12/04/2014 131 (H)  12/04/2014 134 (H)  03/01/2014 151 (H)   CO2 (mmol/L)  Date Value  10/27/2015 25  04/28/2015 27  03/03/2015 26   BUN (mg/dL)  Date Value  10/27/2015 22  04/28/2015 17  03/03/2015 20   Creat (mg/dL)  Date Value  12/28/2012 0.91  02/10/2012 0.99   Creatinine, Ser (mg/dL)  Date Value  10/27/2015 1.11 (H)  04/28/2015 1.05 (H)  03/03/2015 0.93    Assessment:    Diabetes Mellitus type II - newly diagnosed,  A1c at goal  Plan:    1.  Rx changes: none - continue metformin 500mg  qd 2.  Education: Reviewed 'ABCs' of diabetes management (respective goals in parentheses):  A1C (<7), blood pressure (<130/80), and cholesterol (LDL <100). 3. Discussed pathophysiology of DM; difference between type 1 and type 2 DM. 4. CHO counting diet discussed.  Reviewed CHO amount in various foods and how to read nutrition labels.  Discussed recommended serving sizes.  5.  Recommend check BG 1  times a day. Gave Contour glucometer and show pt and daughter how to use.  Rx given for testing supplies.  Discussed HBG goals. 6.  Recommended increase physical activity - goal is 150 minutes per week 7. Follow up: 2 months

## 2015-11-27 NOTE — Patient Instructions (Signed)
Diabetes and Standards of Medical Care   Diabetes is complicated. You may find that your diabetes team includes a dietitian, nurse, diabetes educator, eye doctor, and more. To help everyone know what is going on and to help you get the care you deserve, the following schedule of care was developed to help keep you on track. Below are the tests, exams, vaccines, medicines, education, and plans you will need.  Blood Glucose Goals Prior to meals = 80 - 130 Within 2 hours of the start of a meal = less than 180  HbA1c test (goal is less than 6.5% - your last value was 6.5%) This test shows how well you have controlled your glucose over the past 2 to 3 months. It is used to see if your diabetes management plan needs to be adjusted.   It is performed at least 2 times a year if you are meeting treatment goals.  It is performed 4 times a year if therapy has changed or if you are not meeting treatment goals.  Blood pressure test  This test is performed at every routine medical visit. The goal is less than 140/90 mmHg for most people, but 130/80 mmHg in some cases. Ask your health care provider about your goal.  Dental exam  Follow up with the dentist regularly.  Eye exam  If you are diagnosed with type 1 diabetes as a child, get an exam upon reaching the age of 10 years or older and have had diabetes for 3 to 5 years. Yearly eye exams are recommended after that initial eye exam.  If you are diagnosed with type 1 diabetes as an adult, get an exam within 5 years of diagnosis and then yearly.  If you are diagnosed with type 2 diabetes, get an exam as soon as possible after the diagnosis and then yearly.  Foot care exam  Visual foot exams are performed at every routine medical visit. The exams check for cuts, injuries, or other problems with the feet.  A comprehensive foot exam should be done yearly. This includes visual inspection as well as assessing foot pulses and testing for loss of  sensation.  Check your feet nightly for cuts, injuries, or other problems with your feet. Tell your health care provider if anything is not healing.  Kidney function test (urine microalbumin)  This test is performed once a year.  Type 1 diabetes: The first test is performed 5 years after diagnosis.  Type 2 diabetes: The first test is performed at the time of diagnosis.  A serum creatinine and estimated glomerular filtration rate (eGFR) test is done once a year to assess the level of chronic kidney disease (CKD), if present.  Lipid profile (cholesterol, HDL, LDL, triglycerides)  Performed every 5 years for most people.  The goal for LDL is less than 100 mg/dL. If you are at high risk, the goal is less than 70 mg/dL.  The goal for HDL is 40 mg/dL to 50 mg/dL for men and 50 mg/dL to 60 mg/dL for women. An HDL cholesterol of 60 mg/dL or higher gives some protection against heart disease.  The goal for triglycerides is less than 150 mg/dL.  Influenza vaccine, pneumococcal vaccine, and hepatitis B vaccine  The influenza vaccine is recommended yearly.  The pneumococcal vaccine is generally given once in a lifetime. However, there are some instances when another vaccination is recommended. Check with your health care provider.  The hepatitis B vaccine is also recommended for adults with diabetes.    Diabetes self-management education  Education is recommended at diagnosis and ongoing as needed.  Treatment plan  Your treatment plan is reviewed at every medical visit.  Document Released: 01/13/2009 Document Revised: 11/18/2012 Document Reviewed: 08/18/2012 [employer] Patient Information 2014 [employer].   

## 2015-11-28 ENCOUNTER — Encounter: Payer: Self-pay | Admitting: Pharmacist

## 2015-12-22 ENCOUNTER — Other Ambulatory Visit: Payer: Self-pay | Admitting: Family Medicine

## 2016-01-30 ENCOUNTER — Ambulatory Visit: Payer: Self-pay | Admitting: Family Medicine

## 2016-01-31 ENCOUNTER — Encounter: Payer: Self-pay | Admitting: Family Medicine

## 2016-03-01 ENCOUNTER — Telehealth: Payer: Self-pay | Admitting: Family Medicine

## 2016-03-01 MED ORDER — METFORMIN HCL ER 500 MG PO TB24: 500.0000 mg | ORAL_TABLET | Freq: Every day | ORAL | 0 refills | Status: DC

## 2016-03-01 NOTE — Telephone Encounter (Signed)
Aware per message, one refill sent in on metformin.  She will need to keep her December appointment and get blood work for further refills.

## 2016-03-19 ENCOUNTER — Ambulatory Visit (INDEPENDENT_AMBULATORY_CARE_PROVIDER_SITE_OTHER): Payer: Medicare Other | Admitting: *Deleted

## 2016-03-19 ENCOUNTER — Ambulatory Visit (INDEPENDENT_AMBULATORY_CARE_PROVIDER_SITE_OTHER): Payer: Medicare Other

## 2016-03-19 VITALS — BP 189/82 | HR 77 | Ht 61.0 in | Wt 157.0 lb

## 2016-03-19 DIAGNOSIS — Z780 Asymptomatic menopausal state: Secondary | ICD-10-CM | POA: Diagnosis not present

## 2016-03-19 DIAGNOSIS — Z0000 Encounter for general adult medical examination without abnormal findings: Secondary | ICD-10-CM

## 2016-03-19 DIAGNOSIS — Z23 Encounter for immunization: Secondary | ICD-10-CM | POA: Diagnosis not present

## 2016-03-19 DIAGNOSIS — H547 Unspecified visual loss: Secondary | ICD-10-CM

## 2016-03-19 DIAGNOSIS — Z1231 Encounter for screening mammogram for malignant neoplasm of breast: Secondary | ICD-10-CM

## 2016-03-19 NOTE — Progress Notes (Addendum)
Subjective:   [name] is a 70 y.o. female who presents for an Initial Medicare Annual Wellness Visit. 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   Review of Systems    She feels that her health is about the same as last year.  She has noticed a decrease in her short term memory since suffering a stroke.   Other systems are negative.   Cardiac Risk Factors include: hypertension;dyslipidemia;advanced age (>88men, >52 women);obesity (BMI >30kg/m2);sedentary lifestyle     Objective:    Today's Vitals   03/19/16 1437  BP: (!) 194/91  Pulse: 75  Weight: 157 lb (71.2 kg)  Height: 5\' 1"  (1.549 m)   Body mass index is 29.66 kg/m.   Current Medications (verified) Outpatient Encounter Prescriptions as of 03/19/2016  Medication Sig  . aspirin 325 MG EC tablet Take 325 mg by mouth daily.  Marland Kitchen BAYER MICROLET LANCETS lancets Use to check BG up to once daily.  E11.9 type 2 DM  . calcium carbonate (TUMS) 500 MG chewable tablet Chew 1 tablet by mouth 3 (three) times daily as needed for indigestion or heartburn.  . esomeprazole (NEXIUM) 40 MG capsule TAKE ONE CAPSULE BY MOUTH ONCE DAILY on an empty stomach  . glucose blood (BAYER CONTOUR NEXT TEST) test strip Use to check BG up to once daily.  Dx:  E11.9 - type 2 DM  . levETIRAcetam (KEPPRA) 750 MG tablet One each morning and two each evening. (Patient taking differently: Take 750 mg by mouth 2 (two) times daily. One each morning and two each evening.)  . lisinopril-hydrochlorothiazide (ZESTORETIC) 20-12.5 MG tablet Take 1 tablet by mouth daily.  . metFORMIN (GLUCOPHAGE-XR) 500 MG 24 hr tablet Take 1 tablet (500 mg total) by mouth daily with breakfast.  . rosuvastatin (CRESTOR) 20 MG tablet TAKE ONE TABLET BY MOUTH ONCE DAILY   No facility-administered encounter medications on file as of 03/19/2016.     Allergies (verified) Zocor [simvastatin]   History: Past Medical History:  Diagnosis Date  . Cataract    . Dementia, vascular   . GERD (gastroesophageal reflux disease)   . Hyperlipidemia   . Hypertension   . Seizure disorder ([place])   . Stroke [facility]) May 2013   Past Surgical History:  Procedure Laterality Date  . BUNIONECTOMY    . COLONOSCOPY  10/31/2011    LARGE Internal hemorrhoids, S/P BANDING/ ADENOMATOUS APPEARING Polyp in the ascending colon/ multiple polyps in the  descending, sigmoid   colon and rectumSIMPLE ADENOMAS, SURVEILLANCE 2023  . ESOPHAGOGASTRODUODENOSCOPY  10/31/2011   LARGE Hiatal hernia/Mild gastritis  . HEMORRHOID SURGERY  01/31/2012   Procedure: HEMORRHOIDECTOMY;  Surgeon: Jamesetta So, MD;  Location: [facility];  Service: General;  Laterality: N/A;   Family History  Problem Relation Age of Onset  . Stroke Mother   . Diabetes Mother   . Hypertension Mother   . Hyperlipidemia Mother   . Hypertension Father   . Hyperlipidemia Father   . Alcohol abuse Brother   . Colon cancer Neg Hx    Social History   Occupational History  . Not on file.   Social History Main Topics  . Smoking status: Former Smoker    Packs/day: 1.00    Years: 50.00    Types: Cigarettes    Quit date: 04/02/2011  . Smokeless tobacco: Never Used     Comment: May of 2013  . Alcohol use No  . Drug use: No  .  Sexual activity: Not Currently    Birth control/ protection: Post-menopausal    Tobacco Counseling No tobacco use  Activities of Daily Living In your present state of health, do you have any difficulty performing the following activities: 03/19/2016 10/27/2015  Hearing? N N  Vision? Y N  Difficulty concentrating or making decisions? Y N  Walking or climbing stairs? N N  Dressing or bathing? N N  Doing errands, shopping? N Y  Conservation officer, nature and eating ? N -  Using the Toilet? N -  In the past six months, have you accidently leaked urine? N -  Do you have problems with loss of bowel control? N -  Managing your Medications? N -  Managing your Finances? N -  Housekeeping or  managing your Housekeeping? N -  Some recent data might be hidden   Referral for eye exam placed.  Immunizations and Health Maintenance Immunization History  Administered Date(s) Administered  . Influenza,inj,Quad PF,36+ Mos 12/28/2012, 03/03/2015, 03/19/2016  . Pneumococcal Conjugate-13 04/14/2013  . Pneumococcal Polysaccharide-23 10/10/2011, 02/04/2012   Health Maintenance Due  Topic Date Due  . Hepatitis C Screening  23-Oct-1945  . FOOT EXAM  04/02/1954  . OPHTHALMOLOGY EXAM  04/02/1954  . DEXA SCAN  10/21/2013  . MAMMOGRAM  10/27/2014  . INFLUENZA VACCINE  10/31/2015    Patient Care Team: Claretta Fraise, MD as PCP - General (Family Medicine) Danie Binder, MD (Gastroenterology) Phillips Odor, MD as Consulting Physician (Neurology)     Assessment:   This is a routine wellness examination for [name].   Hearing/Vision screen No deficits noted during visit  Dietary issues and exercise activities discussed: Current Exercise Habits: The patient does not participate in regular exercise at present, Exercise limited by: None identified   Walk with senior group at apartment complex. Consider using the onsite gym as well.   Goals    . Exercise 3x per week (30 min per time)          Walk for 30 minutes three times per week       Reports eating 3 home prepared meals per day and a few snacks.  Depression Screen PHQ 2/9 Scores 03/19/2016 10/27/2015 04/28/2015 03/03/2015 10/13/2014 07/01/2014 05/30/2014  PHQ - 2 Score 0 0 3 0 0 0 0  PHQ- 9 Score - - 4 - - - -    Fall Risk Fall Risk  03/19/2016 10/27/2015 04/28/2015 03/03/2015 10/13/2014  Falls in the past year? No No No No No    Cognitive Function: MMSE - Mini Mental State Exam 03/19/2016  Orientation to time 5  Orientation to Place 4  Registration 3  Attention/ Calculation 4  Recall 3  Language- name 2 objects 2  Language- repeat 1  Language- follow 3 step command 3  Language- read & follow direction 1  Write a sentence 1   Copy design 0  Total score 27   Some difficulty but mostly normal exam      Screening Tests Health Maintenance  Topic Date Due  . Hepatitis C Screening  1945-08-16  . FOOT EXAM  04/02/1954  . OPHTHALMOLOGY EXAM  04/02/1954  . DEXA SCAN  10/21/2013  . MAMMOGRAM  10/27/2014  . INFLUENZA VACCINE  10/31/2015  . HEMOGLOBIN A1C  04/28/2016  . COLONOSCOPY  10/30/2021  . TETANUS/TDAP  10/13/2022  . ZOSTAVAX  Completed  . PNA vac Low Risk Adult  Completed      Plan:   Keep f/u with Dr Livia Snellen 04/09/16  During  the course of the visit, [name] was educated and counseled about the following appropriate screening and preventive services:   Vaccines to include Pneumoccal- up to date, Influenza-given today, Tdap- up to date, Zostavax-up to date   Cardiovascular disease screening  Colorectal cancer screening-done 2013  Bone density screening-done today  Glaucoma screening-recommended annually. Order placed for referral  Mammography/PAP  Nutrition counseling  Exercise  Read and do puzzles to exercise your mind  Patient Instructions (the written plan) were given to the patient.    Chong Sicilian, RN  03/19/2016   I have reviewed and agree with the above AWV documentation. Pt. Needs early F/U due to elevated BP.  Claretta Fraise, M.D.

## 2016-03-19 NOTE — Patient Instructions (Signed)
  [name] ,  Thank you for taking time to come for your Medicare Wellness Visit. I appreciate your ongoing commitment to your health goals. Please review the following plan we discussed and let me know if I can assist you in the future.   These are the goals we discussed: Goals    . Exercise 3x per week (30 min per time)          Walk for 30 minutes three times per week       This is a list of the screening recommended for you and due dates:  Health Maintenance  Topic Date Due  .  Hepatitis C: One time screening is recommended by Center for Disease Control  (CDC) for  adults born from [birth year] through 1966.   [birth year]  . Complete foot exam   04/02/1954  . Eye exam for diabetics  04/02/1954  . DEXA scan (bone density measurement)  10/21/2013  . Mammogram  10/27/2014  . Flu Shot  10/31/2015  . Hemoglobin A1C  04/28/2016  . Colon Cancer Screening  10/30/2021  . Tetanus Vaccine  10/13/2022  . Shingles Vaccine  Completed  . Pneumonia vaccines  Completed

## 2016-03-29 ENCOUNTER — Other Ambulatory Visit: Payer: Self-pay | Admitting: Family Medicine

## 2016-03-29 DIAGNOSIS — Z1231 Encounter for screening mammogram for malignant neoplasm of breast: Secondary | ICD-10-CM

## 2016-04-08 ENCOUNTER — Ambulatory Visit (HOSPITAL_COMMUNITY): Payer: Medicare Other

## 2016-04-09 ENCOUNTER — Ambulatory Visit: Payer: Medicare Other | Admitting: Family Medicine

## 2016-04-09 DIAGNOSIS — E11319 Type 2 diabetes mellitus with unspecified diabetic retinopathy without macular edema: Secondary | ICD-10-CM | POA: Diagnosis not present

## 2016-04-09 DIAGNOSIS — H524 Presbyopia: Secondary | ICD-10-CM | POA: Diagnosis not present

## 2016-04-09 LAB — HM DIABETES EYE EXAM

## 2016-04-16 ENCOUNTER — Ambulatory Visit: Payer: Medicare Other | Admitting: Family Medicine

## 2016-04-19 ENCOUNTER — Encounter: Payer: Self-pay | Admitting: Family Medicine

## 2016-04-22 ENCOUNTER — Ambulatory Visit: Payer: Medicare Other | Admitting: Family Medicine

## 2016-04-30 ENCOUNTER — Encounter: Payer: Self-pay | Admitting: Family Medicine

## 2016-04-30 ENCOUNTER — Ambulatory Visit (INDEPENDENT_AMBULATORY_CARE_PROVIDER_SITE_OTHER): Payer: Medicare Other | Admitting: Family Medicine

## 2016-04-30 VITALS — BP 115/69 | HR 76 | Temp 97.0°F | Ht 61.0 in | Wt 154.0 lb

## 2016-04-30 DIAGNOSIS — E785 Hyperlipidemia, unspecified: Secondary | ICD-10-CM

## 2016-04-30 DIAGNOSIS — I10 Essential (primary) hypertension: Secondary | ICD-10-CM

## 2016-04-30 DIAGNOSIS — E119 Type 2 diabetes mellitus without complications: Secondary | ICD-10-CM

## 2016-04-30 DIAGNOSIS — G2581 Restless legs syndrome: Secondary | ICD-10-CM | POA: Diagnosis not present

## 2016-04-30 DIAGNOSIS — I69354 Hemiplegia and hemiparesis following cerebral infarction affecting left non-dominant side: Secondary | ICD-10-CM

## 2016-04-30 LAB — BAYER DCA HB A1C WAIVED: HB A1C: 6.4 % (ref <7.0)

## 2016-04-30 MED ORDER — METFORMIN HCL ER 500 MG PO TB24: 500.0000 mg | ORAL_TABLET | Freq: Every day | ORAL | 1 refills | Status: DC

## 2016-04-30 NOTE — Progress Notes (Signed)
Subjective:  Patient ID: [name] , female    DOB: 01/20/1946  Age: 71 y.o. MRN: [id number]  CC: Hypertension (pt here today for routine follow up on HTN, no concerns voiced)   HPI [name] presents for  follow-up of hypertension. Patient has no history of headache chest pain or shortness of breath or recent cough. Patient also denies symptoms of TIA such as numbness weakness lateralizing. Patient checks  blood pressure at home and has not had any elevated readings recently. Patient denies side effects from his medication. States taking it regularly.  Patient also  in for follow-up of elevated cholesterol. Doing well without complaints on current medication. Denies side effects of statin including myalgia and arthralgia and nausea. Also in today for liver function testing. Currently no chest pain, shortness of breath or other cardiovascular related symptoms noted.  Follow-up of diabetes. Patient does check blood sugar at home. Readings run between100-150 Patient denies symptoms such as polyuria, polydipsia, excessive hunger, nausea No significant hypoglycemic spells noted. Medications as noted below. Taking them regularly without complication/adverse reaction being reported today.    History [name] has a past medical history of Cataract; Dementia, vascular; GERD (gastroesophageal reflux disease); Hyperlipidemia; Hypertension; Seizure disorder [facility]); and Stroke [facility]) (May 2013).   She has a past surgical history that includes Bunionectomy; Colonoscopy (10/31/2011); Esophagogastroduodenoscopy (10/31/2011); and Hemorrhoid surgery (01/31/2012).   Her family history includes Alcohol abuse in her brother; Diabetes in her mother; Hyperlipidemia in her father and mother; Hypertension in her father and mother; Stroke in her mother.She reports that she quit smoking about 5 years ago. Her smoking use included Cigarettes. She has a 50.00 pack-year smoking history. She has never used smokeless tobacco. She  reports that she does not drink alcohol or use drugs.  Current Outpatient Prescriptions on File Prior to Visit  Medication Sig Dispense Refill  . aspirin 325 MG EC tablet Take 325 mg by mouth daily.    Marland Kitchen BAYER MICROLET LANCETS lancets Use to check BG up to once daily.  E11.9 type 2 DM 100 each 2  . calcium carbonate (TUMS) 500 MG chewable tablet Chew 1 tablet by mouth 3 (three) times daily as needed for indigestion or heartburn.    . esomeprazole (NEXIUM) 40 MG capsule TAKE ONE CAPSULE BY MOUTH ONCE DAILY on an empty stomach 90 capsule 4  . glucose blood (BAYER CONTOUR NEXT TEST) test strip Use to check BG up to once daily.  Dx:  E11.9 - type 2 DM 50 each 2  . levETIRAcetam (KEPPRA) 750 MG tablet One each morning and two each evening. (Patient taking differently: Take 750 mg by mouth 2 (two) times daily. One each morning and two each evening.) 270 tablet 1  . lisinopril-hydrochlorothiazide (ZESTORETIC) 20-12.5 MG tablet Take 1 tablet by mouth daily. 90 tablet 3  . rosuvastatin (CRESTOR) 20 MG tablet TAKE ONE TABLET BY MOUTH ONCE DAILY 90 tablet 0   No current facility-administered medications on file prior to visit.     ROS Review of Systems  Constitutional: Negative for activity change, appetite change and fever.  HENT: Negative for congestion, rhinorrhea and sore throat.   Eyes: Negative for visual disturbance.  Respiratory: Negative for cough and shortness of breath.   Cardiovascular: Negative for chest pain and palpitations.  Gastrointestinal: Negative for abdominal pain, diarrhea and nausea.  Genitourinary: Negative for dysuria.  Musculoskeletal: Negative for arthralgias and myalgias.    Objective:  BP 115/69   Pulse 76   Temp 97 F (  36.1 C) (Oral)   Ht _0  (1.549 m)   Wt 154 lb (69.9 kg)   BMI 29.10 kg/m   BP Readings from Last 3 Encounters:  04/30/16 115/69  03/19/16 (!) 189/82  11/27/15 122/68    Wt Readings from Last 3 Encounters:  04/30/16 154 lb (69.9 kg)    03/19/16 157 lb (71.2 kg)  11/27/15 148 lb 8 oz (67.4 kg)     Physical Exam  Constitutional: She is oriented to person, place, and time. She appears well-developed and well-nourished. No distress.  HENT:  Head: Normocephalic and atraumatic.  Right Ear: External ear normal.  Left Ear: External ear normal.  Nose: Nose normal.  Mouth/Throat: Oropharynx is clear and moist.  Eyes: Conjunctivae and EOM are normal. Pupils are equal, round, and reactive to light.  Neck: Normal range of motion. Neck supple. No thyromegaly present.  Cardiovascular: Normal rate, regular rhythm and normal heart sounds.   No murmur heard. Pulmonary/Chest: Effort normal and breath sounds normal. No respiratory distress. She has no wheezes. She has no rales.  Abdominal: Soft. Bowel sounds are normal. She exhibits no distension. There is no tenderness.  Lymphadenopathy:    She has no cervical adenopathy.  Neurological: She is alert and oriented to person, place, and time. She has normal reflexes.  Skin: Skin is warm and dry.  Psychiatric: She has a normal mood and affect. Her behavior is normal. Judgment and thought content normal.    No components found for: [id number]  Lab Results  Component Value Date   WBC 6.3 10/27/2015   HGB 12.2 12/04/2014   HCT 33.7 (L) 10/27/2015   PLT 291 10/27/2015   GLUCOSE 103 (H) 10/27/2015   CHOL 131 04/28/2015   TRIG 223 (H) 04/28/2015   HDL 75 04/28/2015   LDLCALC 11 04/28/2015   ALT 9 10/27/2015   AST 16 10/27/2015   NA 140 10/27/2015   K 4.2 10/27/2015   CL 99 10/27/2015   CREATININE 1.11 (H) 10/27/2015   BUN 22 10/27/2015   CO2 25 10/27/2015   TSH 1.340 07/01/2014   INR 1.08 12/04/2014   HGBA1C 6.3 07/01/2014       Assessment & Plan:   [name] was seen today for hypertension.  Diagnoses and all orders for this visit:  Hemiparesis affecting left side as late effect of stroke ([place])  Restless leg syndrome, uncontrolled  Type 2 diabetes mellitus  without complication, without long-term current use of insulin (HCC) -     Microalbumin / creatinine urine ratio -     Bayer DCA Hb A1c Waived -     Lipid panel -     CMP14+EGFR -     Ambulatory referral to Ophthalmology  Essential hypertension  Hyperlipidemia LDL goal <100 -     Lipid panel  Other orders -     metFORMIN (GLUCOPHAGE-XR) 500 MG 24 hr tablet; Take 1 tablet (500 mg total) by mouth daily with breakfast.   I am having [name] maintain her aspirin, calcium carbonate, esomeprazole, lisinopril-hydrochlorothiazide, levETIRAcetam, glucose blood, BAYER MICROLET LANCETS, rosuvastatin, and metFORMIN.  Meds ordered this encounter  Medications  . metFORMIN (GLUCOPHAGE-XR) 500 MG 24 hr tablet    Sig: Take 1 tablet (500 mg total) by mouth daily with breakfast.    Dispense:  90 tablet    Refill:  1     Follow-up: Return in about 3 months (around 07/29/2016).  Claretta Fraise, M.D.

## 2016-05-01 ENCOUNTER — Other Ambulatory Visit: Payer: Self-pay | Admitting: Family Medicine

## 2016-05-01 LAB — CMP14+EGFR
A/G RATIO: 1.6 (ref 1.2–2.2)
ALK PHOS: 84 IU/L (ref 39–117)
ALT: 10 IU/L (ref 0–32)
AST: 15 IU/L (ref 0–40)
Albumin: 4.5 g/dL (ref 3.5–4.8)
BUN/Creatinine Ratio: 22 (ref 12–28)
BUN: 28 mg/dL — AB (ref 8–27)
Bilirubin Total: <0.2 mg/dL (ref 0.0–1.2)
CO2: 23 mmol/L (ref 18–29)
CREATININE: 1.27 mg/dL — AB (ref 0.57–1.00)
Calcium: 9.9 mg/dL (ref 8.7–10.3)
Chloride: 98 mmol/L (ref 96–106)
GFR calc Af Amer: 49 mL/min/{1.73_m2} — ABNORMAL LOW (ref >59)
GFR calc non Af Amer: 42 mL/min/{1.73_m2} — ABNORMAL LOW (ref >59)
GLOBULIN, TOTAL: 2.9 g/dL (ref 1.5–4.5)
Glucose: 93 mg/dL (ref 65–99)
Potassium: 4.3 mmol/L (ref 3.5–5.2)
Sodium: 141 mmol/L (ref 134–144)
TOTAL PROTEIN: 7.4 g/dL (ref 6.0–8.5)

## 2016-05-01 LAB — LIPID PANEL
CHOL/HDL RATIO: 4.6 ratio — AB (ref 0.0–4.4)
CHOLESTEROL TOTAL: 240 mg/dL — AB (ref 100–199)
HDL: 52 mg/dL (ref >39)
Triglycerides: 476 mg/dL — ABNORMAL HIGH (ref 0–149)

## 2016-05-09 ENCOUNTER — Encounter: Payer: Self-pay | Admitting: *Deleted

## 2016-05-20 DIAGNOSIS — Z7984 Long term (current) use of oral hypoglycemic drugs: Secondary | ICD-10-CM | POA: Diagnosis not present

## 2016-05-20 DIAGNOSIS — E119 Type 2 diabetes mellitus without complications: Secondary | ICD-10-CM | POA: Diagnosis not present

## 2016-05-20 DIAGNOSIS — H25813 Combined forms of age-related cataract, bilateral: Secondary | ICD-10-CM | POA: Diagnosis not present

## 2016-06-18 ENCOUNTER — Other Ambulatory Visit: Payer: Self-pay | Admitting: Family Medicine

## 2016-06-28 ENCOUNTER — Other Ambulatory Visit: Payer: Self-pay | Admitting: Family Medicine

## 2016-06-28 DIAGNOSIS — I10 Essential (primary) hypertension: Secondary | ICD-10-CM

## 2016-07-16 ENCOUNTER — Telehealth: Payer: Self-pay | Admitting: Family Medicine

## 2016-07-16 MED ORDER — ROSUVASTATIN CALCIUM 20 MG PO TABS: 20.0000 mg | ORAL_TABLET | Freq: Every day | ORAL | 1 refills | Status: DC

## 2016-07-16 NOTE — Telephone Encounter (Signed)
done

## 2016-07-24 ENCOUNTER — Telehealth: Payer: Self-pay | Admitting: Family Medicine

## 2016-07-24 NOTE — Telephone Encounter (Signed)
Patient daughter is calling wanting her Crestor change to something cheaper because it is going to cost her $600. Please advise. [facility] in [place]

## 2016-07-27 ENCOUNTER — Other Ambulatory Visit: Payer: Self-pay | Admitting: Family Medicine

## 2016-07-27 DIAGNOSIS — E785 Hyperlipidemia, unspecified: Secondary | ICD-10-CM

## 2016-07-30 ENCOUNTER — Ambulatory Visit: Payer: Medicare Other | Admitting: Family Medicine

## 2016-08-01 ENCOUNTER — Encounter: Payer: Self-pay | Admitting: Family Medicine

## 2016-08-05 ENCOUNTER — Ambulatory Visit (INDEPENDENT_AMBULATORY_CARE_PROVIDER_SITE_OTHER): Payer: Medicare Other | Admitting: Family Medicine

## 2016-08-05 VITALS — BP 138/78 | HR 70 | Temp 97.2°F | Ht 61.0 in | Wt 158.0 lb

## 2016-08-05 DIAGNOSIS — E119 Type 2 diabetes mellitus without complications: Secondary | ICD-10-CM | POA: Diagnosis not present

## 2016-08-05 DIAGNOSIS — E785 Hyperlipidemia, unspecified: Secondary | ICD-10-CM | POA: Diagnosis not present

## 2016-08-05 DIAGNOSIS — I10 Essential (primary) hypertension: Secondary | ICD-10-CM

## 2016-08-05 DIAGNOSIS — I69354 Hemiplegia and hemiparesis following cerebral infarction affecting left non-dominant side: Secondary | ICD-10-CM

## 2016-08-05 DIAGNOSIS — F0150 Vascular dementia without behavioral disturbance: Secondary | ICD-10-CM

## 2016-08-05 NOTE — Progress Notes (Signed)
Subjective:  Patient ID: Shannon [name], female    DOB: 28-Sep-1945  Age: 71 y.o. MRN: [id number]  CC: Hypertension (pt here today for routine follow up of her chronic medical conditions. No other concerns voiced.)   HPI Shannon [name] presents for  follow-up of hypertension. Patient has no history of headache chest pain or shortness of breath or recent cough. Patient also denies symptoms of TIA such as numbness weakness lateralizing. States that the weakness from the previous stroke has resolvedPatient checks  blood pressure at home. Recent readings have been adequate Patient denies side effects from medication. States taking it regularly.  Patient also  in for follow-up of elevated cholesterol.Has been off med due to  Also in today for liver function testing. Currently no chest pain, shortness of breath or other cardiovascular related symptoms noted.She went off of the Crestor because of the cost.  Follow-up of diabetes. Patient does not check blood sugar at home. Patient denies symptoms such as polyuria, polydipsia, excessive hunger, nausea No significant hypoglycemic spells noted. Medications reviewed. Pt reports discontinuing after one dose. It constipated her and the pill came through unchanged in her stool.   History Shannon [name] has a past medical history of Cataract; Dementia, vascular; GERD (gastroesophageal reflux disease); Hyperlipidemia; Hypertension; Seizure disorder [facility] [name]); and Stroke [facility]) (May 2013).   She has a past surgical history that includes Bunionectomy; Colonoscopy (10/31/2011); Esophagogastroduodenoscopy (10/31/2011); and Hemorrhoid surgery (01/31/2012).   Her family history includes Alcohol abuse in her brother; Diabetes in her mother; Hyperlipidemia in her father and mother; Hypertension in her father and mother; Stroke in her mother.She reports that she quit smoking about 5 years ago. Her smoking use included Cigarettes. She has a 50.00 pack-year smoking history. She has never used  smokeless tobacco. She reports that she does not drink alcohol or use drugs.  Current Outpatient Prescriptions on File Prior to Visit  Medication Sig Dispense Refill  . aspirin 325 MG EC tablet Take 325 mg by mouth daily.    Marland Kitchen BAYER MICROLET LANCETS lancets Use to check BG up to once daily.  E11.9 type 2 DM 100 each 2  . calcium carbonate (TUMS) 500 MG chewable tablet Chew 1 tablet by mouth 3 (three) times daily as needed for indigestion or heartburn.    . esomeprazole (NEXIUM) 40 MG capsule TAKE ONE CAPSULE BY MOUTH ONCE DAILY on an empty stomach 90 capsule 4  . glucose blood (BAYER CONTOUR NEXT TEST) test strip Use to check BG up to once daily.  Dx:  E11.9 - type 2 DM 50 each 2  . levETIRAcetam (KEPPRA) 750 MG tablet TAKE ONE TABLET BY MOUTH ONCE DAILY IN THE MORNING AND TWO ONCE DAILY IN THE EVENING 270 tablet 0  . lisinopril-hydrochlorothiazide (PRINZIDE,ZESTORETIC) 20-12.5 MG tablet TAKE ONE TABLET BY MOUTH ONCE DAILY 90 tablet 0  . metFORMIN (GLUCOPHAGE-XR) 500 MG 24 hr tablet Take 1 tablet (500 mg total) by mouth daily with breakfast. 90 tablet 1  . simvastatin (ZOCOR) 20 MG tablet TAKE ONE TABLET BY MOUTH ONCE DAILY AT  6PM 90 tablet 0   No current facility-administered medications on file prior to visit.     ROS Review of Systems  Constitutional: Negative for activity change, appetite change and fever.  HENT: Negative for congestion, rhinorrhea and sore throat.   Eyes: Negative for visual disturbance.  Respiratory: Negative for cough and shortness of breath.   Cardiovascular: Negative for chest pain and palpitations.  Gastrointestinal: Negative for abdominal pain, diarrhea and nausea.  Genitourinary: Negative for dysuria.  Musculoskeletal: Negative for arthralgias and myalgias.    Objective:  BP (!) 157/82   Pulse 70   Temp 97.2 F (36.2 C) (Oral)   Ht 5' 1" (1.549 m)   Wt 158 lb (71.7 kg)   BMI 29.85 kg/m   BP Readings from Last 3 Encounters:  08/05/16 (!) 157/82    04/30/16 115/69  03/19/16 (!) 189/82    Wt Readings from Last 3 Encounters:  08/05/16 158 lb (71.7 kg)  04/30/16 154 lb (69.9 kg)  03/19/16 157 lb (71.2 kg)     Physical Exam  Constitutional: She is oriented to person, place, and time. She appears well-developed and well-nourished. No distress.  HENT:  Head: Normocephalic and atraumatic.  Right Ear: External ear normal.  Left Ear: External ear normal.  Nose: Nose normal.  Mouth/Throat: Oropharynx is clear and moist.  Eyes: Conjunctivae and EOM are normal. Pupils are equal, round, and reactive to light.  Neck: Normal range of motion. Neck supple. No thyromegaly present.  Cardiovascular: Normal rate, regular rhythm and normal heart sounds.   No murmur heard. Pulmonary/Chest: Effort normal and breath sounds normal. No respiratory distress. She has no wheezes. She has no rales.  Abdominal: Soft. Bowel sounds are normal. She exhibits no distension. There is no tenderness.  Lymphadenopathy:    She has no cervical adenopathy.  Neurological: She is alert and oriented to person, place, and time. She has normal reflexes.  Skin: Skin is warm and dry.  Psychiatric: She has a normal mood and affect. Her behavior is normal. Judgment and thought content normal. Cognition and memory are impaired.  Patient is rather repetitive regarding her concerns about the pill not dissolving in her system. Goes into a little bit too much detail repetitively. Resembles some cognitive deficit. We'll monitor. May need Mini-Mental status in the future, however at this point it does not seem severe enough to merit medication.    No components found for: BAYER DCA HB A1C WAIVED    Assessment & Plan:   Gissele was seen today for hypertension.  Diagnoses and all orders for this visit:  Type 2 diabetes mellitus without complication, without long-term current use of insulin (HCC) -     Bayer DCA Hb A1c Waived -     CBC with Differential/Platelet -      CMP14+EGFR -     Lipid panel  Hyperlipidemia LDL goal <100 -     Lipid panel  Essential hypertension -     CMP14+EGFR  Hemiparesis affecting left side as late effect of stroke (HCC)  Vascular dementia without behavioral disturbance   I have discontinued Ms. Pardee's rosuvastatin. I am also having her maintain her aspirin, calcium carbonate, esomeprazole, glucose blood, BAYER MICROLET LANCETS, metFORMIN, levETIRAcetam, lisinopril-hydrochlorothiazide, and simvastatin.  No orders of the defined types were placed in this encounter. Zocor was refilled at patient's request. Apparently there is not a true allergy but she did have some side effects in the past. She'll give it a try and let me know if there are any reactions. I will also recommend CoQ10 100 mg a day to help prevent the side effects. Follow-up: Return in about 3 months (around 11/05/2016).   , M.D. 

## 2016-08-19 ENCOUNTER — Telehealth: Payer: Self-pay | Admitting: Family Medicine

## 2016-10-10 ENCOUNTER — Encounter: Payer: Self-pay | Admitting: *Deleted

## 2016-11-05 ENCOUNTER — Ambulatory Visit: Payer: Medicare Other | Admitting: Family Medicine

## 2016-11-11 ENCOUNTER — Ambulatory Visit (INDEPENDENT_AMBULATORY_CARE_PROVIDER_SITE_OTHER): Payer: Medicare Other | Admitting: Family Medicine

## 2016-11-11 ENCOUNTER — Encounter: Payer: Self-pay | Admitting: Family Medicine

## 2016-11-11 VITALS — BP 137/73 | HR 69 | Temp 97.3°F | Ht 61.0 in | Wt 159.0 lb

## 2016-11-11 DIAGNOSIS — I10 Essential (primary) hypertension: Secondary | ICD-10-CM

## 2016-11-11 DIAGNOSIS — E119 Type 2 diabetes mellitus without complications: Secondary | ICD-10-CM | POA: Diagnosis not present

## 2016-11-11 DIAGNOSIS — E785 Hyperlipidemia, unspecified: Secondary | ICD-10-CM

## 2016-11-11 LAB — BAYER DCA HB A1C WAIVED: HB A1C: 6 % (ref <7.0)

## 2016-11-11 MED ORDER — ESOMEPRAZOLE MAGNESIUM 40 MG PO CPDR: DELAYED_RELEASE_CAPSULE | ORAL | 4 refills | Status: DC

## 2016-11-11 MED ORDER — SIMVASTATIN 20 MG PO TABS: ORAL_TABLET | ORAL | 1 refills | Status: DC

## 2016-11-11 MED ORDER — LEVETIRACETAM 750 MG PO TABS: ORAL_TABLET | ORAL | 1 refills | Status: DC

## 2016-11-11 MED ORDER — LISINOPRIL-HYDROCHLOROTHIAZIDE 20-12.5 MG PO TABS: 1.0000 | ORAL_TABLET | Freq: Every day | ORAL | 1 refills | Status: DC

## 2016-11-11 MED ORDER — METFORMIN HCL ER 500 MG PO TB24: 500.0000 mg | ORAL_TABLET | Freq: Every day | ORAL | 1 refills | Status: DC

## 2016-11-11 NOTE — Patient Instructions (Signed)

## 2016-11-11 NOTE — Addendum Note (Signed)
Addended by: Marylin Crosby on: 11/11/2016 02:02 PM   Modules accepted: Orders

## 2016-11-11 NOTE — Progress Notes (Signed)
Subjective:  Patient ID: [name],  female    DOB: 1946/01/11  Age: 71 y.o.    CC: Diabetes (pt here today for routine f/u of her chronic medical conditions)   HPI [name] presents for  follow-up of hypertension. Patient has no history of headache chest pain or shortness of breath or recent cough. Patient also denies symptoms of TIA such as numbness weakness lateralizing. Patient does not check  blood pressure at home.  Patient denies side effects from medication. States taking it regularly.  Patient also  in for follow-up of elevated cholesterol. Doing well without complaints on current medication. Denies side effects ofZocor since rechallenge including myalgia and arthralgia and nausea. Also in today for liver function testing. Currently no chest pain, shortness of breath or other cardiovascular related symptoms noted.  Follow-up of diabetes. Patient does not check blood sugar at home. R Patient denies symptoms such as polyuria, polydipsia, excessive hunger, nausea No significant hypoglycemic spells noted. Medications reviewed. Pt reports taking them regularly. Pt. denies complication/adverse reaction today.    History [name] has a past medical history of Cataract; Dementia, vascular; GERD (gastroesophageal reflux disease); Hyperlipidemia; Hypertension; Seizure disorder [facility]); and Stroke [facility]) (May 2013).   She has a past surgical history that includes Bunionectomy; Colonoscopy (10/31/2011); Esophagogastroduodenoscopy (10/31/2011); and Hemorrhoid surgery (01/31/2012).   Her family history includes Alcohol abuse in her brother; Diabetes in her mother; Hyperlipidemia in her father and mother; Hypertension in her father and mother; Stroke in her mother.She reports that she quit smoking about 5 years ago. Her smoking use included Cigarettes. She has a 50.00 pack-year smoking history. She has never used smokeless tobacco. She reports that she does not drink alcohol or use drugs.  Current  Outpatient Prescriptions on File Prior to Visit  Medication Sig Dispense Refill  . aspirin 325 MG EC tablet Take 325 mg by mouth daily.    Marland Kitchen BAYER MICROLET LANCETS lancets Use to check BG up to once daily.  E11.9 type 2 DM 100 each 2  . esomeprazole (NEXIUM) 40 MG capsule TAKE ONE CAPSULE BY MOUTH ONCE DAILY on an empty stomach 90 capsule 4  . glucose blood (BAYER CONTOUR NEXT TEST) test strip Use to check BG up to once daily.  Dx:  E11.9 - type 2 DM 50 each 2  . levETIRAcetam (KEPPRA) 750 MG tablet TAKE ONE TABLET BY MOUTH ONCE DAILY IN THE MORNING AND TWO ONCE DAILY IN THE EVENING 270 tablet 0  . lisinopril-hydrochlorothiazide (PRINZIDE,ZESTORETIC) 20-12.5 MG tablet TAKE ONE TABLET BY MOUTH ONCE DAILY 90 tablet 0  . metFORMIN (GLUCOPHAGE-XR) 500 MG 24 hr tablet Take 1 tablet (500 mg total) by mouth daily with breakfast. 90 tablet 1  . simvastatin (ZOCOR) 20 MG tablet TAKE ONE TABLET BY MOUTH ONCE DAILY AT  6PM 90 tablet 0   No current facility-administered medications on file prior to visit.     ROS Review of Systems  Constitutional: Negative for activity change, appetite change and fever.  HENT: Negative for congestion, rhinorrhea and sore throat.   Eyes: Negative for visual disturbance.  Respiratory: Negative for cough and shortness of breath.   Cardiovascular: Negative for chest pain and palpitations.  Gastrointestinal: Negative for abdominal pain, diarrhea and nausea.  Genitourinary: Negative for dysuria.  Musculoskeletal: Negative for arthralgias and myalgias.    Objective:  BP 137/73   Pulse 69   Temp (!) 97.3 F (36.3 C) (Oral)   Ht 5\' 1"  (1.549 m)   Wt 159  lb (72.1 kg)   BMI 30.04 kg/m   BP Readings from Last 3 Encounters:  11/11/16 137/73  08/05/16 138/78  04/30/16 115/69    Wt Readings from Last 3 Encounters:  11/11/16 159 lb (72.1 kg)  08/05/16 158 lb (71.7 kg)  04/30/16 154 lb (69.9 kg)     Physical Exam  Constitutional: She is oriented to person,  place, and time. She appears well-developed and well-nourished. No distress.  HENT:  Head: Normocephalic and atraumatic.  Right Ear: Tympanic membrane and external ear normal. No decreased hearing is noted.  Left Ear: Tympanic membrane and external ear normal. No decreased hearing is noted.  Nose: Mucosal edema present. Right sinus exhibits no frontal sinus tenderness. Left sinus exhibits no frontal sinus tenderness.  Mouth/Throat: Oropharynx is clear and moist. No oropharyngeal exudate or posterior oropharyngeal erythema.  Eyes: Pupils are equal, round, and reactive to light. Conjunctivae and EOM are normal.  Neck: Normal range of motion. Neck supple. No Brudzinski's sign noted. No thyromegaly present.  Cardiovascular: Normal rate, regular rhythm and normal heart sounds.   No murmur heard. Pulmonary/Chest: Effort normal and breath sounds normal. No respiratory distress. She has no wheezes. She has no rales.  Abdominal: Soft. Bowel sounds are normal. She exhibits no distension. There is no tenderness.  Lymphadenopathy:       Head (right side): No preauricular adenopathy present.       Head (left side): No preauricular adenopathy present.    She has no cervical adenopathy.       Right cervical: No superficial cervical adenopathy present.      Left cervical: No superficial cervical adenopathy present.  Neurological: She is alert and oriented to person, place, and time. She has normal reflexes.  Skin: Skin is warm and dry.  Psychiatric: She has a normal mood and affect. Her behavior is normal. Judgment and thought content normal.    Diabetic Foot Exam - Simple   Simple Foot Form Diabetic Foot exam was performed with the following findings:  Yes 11/11/2016  1:46 PM  Visual Inspection No deformities, no ulcerations, no other skin breakdown bilaterally:  Yes Sensation Testing Intact to touch and monofilament testing bilaterally:  Yes Pulse Check Posterior Tibialis and Dorsalis pulse intact  bilaterally:  Yes Comments       Assessment & Plan:   [name] was seen today for diabetes.  Diagnoses and all orders for this visit:  Essential hypertension -     lisinopril-hydrochlorothiazide (PRINZIDE,ZESTORETIC) 20-12.5 MG tablet; Take 1 tablet by mouth daily.  Hyperlipidemia LDL goal <100 -     simvastatin (ZOCOR) 20 MG tablet; TAKE ONE TABLET BY MOUTH ONCE DAILY AT  6PM  Other orders -     esomeprazole (NEXIUM) 40 MG capsule; TAKE ONE CAPSULE BY MOUTH ONCE DAILY on an empty stomach -     levETIRAcetam (KEPPRA) 750 MG tablet; TAKE ONE TABLET BY MOUTH ONCE DAILY IN THE MORNING AND TWO ONCE DAILY IN THE EVENING -     metFORMIN (GLUCOPHAGE-XR) 500 MG 24 hr tablet; Take 1 tablet (500 mg total) by mouth daily with breakfast.   I have discontinued Ms. Stair's calcium carbonate. I am also having her maintain her aspirin, esomeprazole, glucose blood, BAYER MICROLET LANCETS, metFORMIN, levETIRAcetam, lisinopril-hydrochlorothiazide, and simvastatin.  No orders of the defined types were placed in this encounter.  Continue meds as is. Encouraged home glucose monitoring as well as home pressure monitoring. Carb counting for diabetics handout printed for the patient today.  Follow-up:  Return in about 3 months (around 02/11/2017).  Claretta Fraise, M.D.

## 2016-11-12 LAB — CMP14+EGFR
ALBUMIN: 4.4 g/dL (ref 3.5–4.8)
ALK PHOS: 89 IU/L (ref 39–117)
ALT: 11 IU/L (ref 0–32)
AST: 16 IU/L (ref 0–40)
Albumin/Globulin Ratio: 1.8 (ref 1.2–2.2)
BUN / CREAT RATIO: 13 (ref 12–28)
BUN: 14 mg/dL (ref 8–27)
CHLORIDE: 105 mmol/L (ref 96–106)
CO2: 23 mmol/L (ref 20–29)
Calcium: 9.3 mg/dL (ref 8.7–10.3)
Creatinine, Ser: 1.1 mg/dL — ABNORMAL HIGH (ref 0.57–1.00)
GFR calc non Af Amer: 50 mL/min/{1.73_m2} — ABNORMAL LOW (ref >59)
GFR, EST AFRICAN AMERICAN: 58 mL/min/{1.73_m2} — AB (ref >59)
GLOBULIN, TOTAL: 2.4 g/dL (ref 1.5–4.5)
Glucose: 91 mg/dL (ref 65–99)
Potassium: 4.1 mmol/L (ref 3.5–5.2)
SODIUM: 142 mmol/L (ref 134–144)
TOTAL PROTEIN: 6.8 g/dL (ref 6.0–8.5)

## 2016-11-12 LAB — MICROALBUMIN / CREATININE URINE RATIO
Creatinine, Urine: 217.6 mg/dL
Microalb/Creat Ratio: 6.1 mg/g creat (ref 0.0–30.0)
Microalbumin, Urine: 13.2 ug/mL

## 2016-12-31 DIAGNOSIS — M79676 Pain in unspecified toe(s): Secondary | ICD-10-CM | POA: Diagnosis not present

## 2016-12-31 DIAGNOSIS — L84 Corns and callosities: Secondary | ICD-10-CM | POA: Diagnosis not present

## 2016-12-31 DIAGNOSIS — B351 Tinea unguium: Secondary | ICD-10-CM | POA: Diagnosis not present

## 2016-12-31 DIAGNOSIS — E1142 Type 2 diabetes mellitus with diabetic polyneuropathy: Secondary | ICD-10-CM | POA: Diagnosis not present

## 2017-02-11 ENCOUNTER — Ambulatory Visit: Payer: Medicare Other | Admitting: Family Medicine

## 2017-04-14 ENCOUNTER — Encounter: Payer: Self-pay | Admitting: Family Medicine

## 2017-04-14 ENCOUNTER — Ambulatory Visit (INDEPENDENT_AMBULATORY_CARE_PROVIDER_SITE_OTHER): Payer: Medicare Other | Admitting: Family Medicine

## 2017-04-14 VITALS — BP 182/79 | HR 80 | Temp 97.1°F | Ht 61.0 in | Wt 161.0 lb

## 2017-04-14 DIAGNOSIS — E785 Hyperlipidemia, unspecified: Secondary | ICD-10-CM | POA: Diagnosis not present

## 2017-04-14 DIAGNOSIS — Z23 Encounter for immunization: Secondary | ICD-10-CM

## 2017-04-14 DIAGNOSIS — I10 Essential (primary) hypertension: Secondary | ICD-10-CM | POA: Diagnosis not present

## 2017-04-14 DIAGNOSIS — E119 Type 2 diabetes mellitus without complications: Secondary | ICD-10-CM | POA: Diagnosis not present

## 2017-04-14 LAB — BAYER DCA HB A1C WAIVED: HB A1C (BAYER DCA - WAIVED): 6.2 % (ref <7.0)

## 2017-04-14 MED ORDER — SIMVASTATIN 20 MG PO TABS: ORAL_TABLET | ORAL | 1 refills | Status: DC

## 2017-04-14 MED ORDER — LEVETIRACETAM 750 MG PO TABS: ORAL_TABLET | ORAL | 1 refills | Status: DC

## 2017-04-14 MED ORDER — BAYER MICROLET LANCETS MISC: 2 refills | Status: DC

## 2017-04-14 MED ORDER — GLUCOSE BLOOD VI STRP: ORAL_STRIP | 2 refills | Status: DC

## 2017-04-14 MED ORDER — METFORMIN HCL ER 500 MG PO TB24: 500.0000 mg | ORAL_TABLET | Freq: Every day | ORAL | 1 refills | Status: DC

## 2017-04-14 MED ORDER — CARVEDILOL 6.25 MG PO TABS: 6.2500 mg | ORAL_TABLET | Freq: Two times a day (BID) | ORAL | 3 refills | Status: DC

## 2017-04-14 NOTE — Progress Notes (Signed)
Subjective:  Patient ID: [name],  female    DOB: 05/01/1945  Age: 72 y.o.    CC: Diabetes (pt here today for routine follow up of her chronic medical conditions)   HPI Marymargaret Kirker presents for  follow-up of hypertension. Patient has no history of headache chest pain or shortness of breath or recent cough. Patient also denies symptoms of TIA such as numbness weakness lateralizing. Patient checks  blood pressure at home. Recent readings have been good Patient denies side effects from medication. States taking it regularly.  She does not take her medicine before the office visit today and she feels that is why her blood pressure is high which is unusual for her.  Additionally she knows 2 people who have had angioedema from lisinopril and she would like to stop that medication.  Patient also  in for follow-up of elevated cholesterol. Doing well without complaints on current medication. Denies side effects of statin including myalgia and arthralgia and nausea. Also in today for liver function testing. Currently no chest pain, shortness of breath or other cardiovascular related symptoms noted.  Follow-up of diabetes. Patient does check blood sugar at home. Readings run between low 100s fasting and mid 200s postprandial Patient denies symptoms such as polyuria, polydipsia, excessive hunger, nausea No significant hypoglycemic spells noted. Medications reviewed. Pt reports taking them regularly. Pt. denies complication/adverse reaction today.    History Lunden has a past medical history of Cataract, Dementia, vascular, GERD (gastroesophageal reflux disease), Hyperlipidemia, Hypertension, Seizure disorder ([place]), and Stroke [facility]) (May 2013).   She has a past surgical history that includes Bunionectomy; Colonoscopy (10/31/2011); Esophagogastroduodenoscopy (10/31/2011); and Hemorrhoid surgery (01/31/2012).   Her family history includes Alcohol abuse in her brother; Diabetes in her mother; Hyperlipidemia  in her father and mother; Hypertension in her father and mother; Stroke in her mother.She reports that she quit smoking about 6 years ago. Her smoking use included cigarettes. She has a 50.00 pack-year smoking history. she has never used smokeless tobacco. She reports that she does not drink alcohol or use drugs.  Current Outpatient Medications on File Prior to Visit  Medication Sig Dispense Refill  . aspirin 325 MG EC tablet Take 325 mg by mouth daily.    Marland Kitchen esomeprazole (NEXIUM) 40 MG capsule TAKE ONE CAPSULE BY MOUTH ONCE DAILY on an empty stomach 90 capsule 4   No current facility-administered medications on file prior to visit.     ROS Review of Systems  Constitutional: Negative for activity change, appetite change and fever.  HENT: Negative for congestion, rhinorrhea and sore throat.   Eyes: Negative for visual disturbance.  Respiratory: Negative for cough and shortness of breath.   Cardiovascular: Negative for chest pain and palpitations.  Gastrointestinal: Negative for abdominal pain, diarrhea and nausea.  Genitourinary: Negative for dysuria.  Musculoskeletal: Negative for arthralgias and myalgias.    Objective:  BP (!) 182/79   Pulse 80   Temp (!) 97.1 F (36.2 C) (Oral)   Ht _0  (1.549 m)   Wt 161 lb (73 kg)   BMI 30.42 kg/m   BP Readings from Last 3 Encounters:  04/14/17 (!) 182/79  11/11/16 137/73  08/05/16 138/78    Wt Readings from Last 3 Encounters:  04/14/17 161 lb (73 kg)  11/11/16 159 lb (72.1 kg)  08/05/16 158 lb (71.7 kg)     Physical Exam  Constitutional: She is oriented to person, place, and time. She appears well-developed and well-nourished. No distress.  HENT:  Head: Normocephalic and atraumatic.  Right Ear: External ear normal.  Left Ear: External ear normal.  Nose: Nose normal.  Mouth/Throat: Oropharynx is clear and moist.  Eyes: Conjunctivae and EOM are normal. Pupils are equal, round, and reactive to light.  Neck: Normal range of  motion. Neck supple. No thyromegaly present.  Cardiovascular: Normal rate, regular rhythm and normal heart sounds.  No murmur heard. Pulmonary/Chest: Effort normal and breath sounds normal. No respiratory distress. She has no wheezes. She has no rales.  Abdominal: Soft. Bowel sounds are normal. She exhibits no distension. There is no tenderness.  Lymphadenopathy:    She has no cervical adenopathy.  Neurological: She is alert and oriented to person, place, and time. She has normal reflexes.  Skin: Skin is warm and dry.  Psychiatric: She has a normal mood and affect. Her behavior is normal. Judgment and thought content normal.      Assessment & Plan:   Akela was seen today for diabetes.  Diagnoses and all orders for this visit:  Essential hypertension -     CBC with Differential/Platelet -     CMP14+EGFR  Hyperlipidemia LDL goal <100 -     Lipid panel -     simvastatin (ZOCOR) 20 MG tablet; TAKE ONE TABLET BY MOUTH ONCE DAILY AT  6PM  Type 2 diabetes mellitus without complication, without long-term current use of insulin (HCC) -     Bayer DCA Hb A1c Waived  Other orders -     BAYER MICROLET LANCETS lancets; Use to check BG up to once daily.  E11.9 type 2 DM -     glucose blood (BAYER CONTOUR NEXT TEST) test strip; Use to check BG up to once daily.  Dx:  E11.9 - type 2 DM -     levETIRAcetam (KEPPRA) 750 MG tablet; TAKE ONE TABLET BY MOUTH ONCE DAILY IN THE MORNING AND TWO ONCE DAILY IN THE EVENING -     metFORMIN (GLUCOPHAGE-XR) 500 MG 24 hr tablet; Take 1 tablet (500 mg total) by mouth daily with breakfast. -     carvedilol (COREG) 6.25 MG tablet; Take 1 tablet (6.25 mg total) by mouth 2 (two) times daily with a meal.   I have discontinued Karrington Gotcher's lisinopril-hydrochlorothiazide. I am also having her start on carvedilol. Additionally, I am having her maintain her aspirin, esomeprazole, simvastatin, BAYER MICROLET LANCETS, glucose blood, levETIRAcetam, and metFORMIN.  Meds  ordered this encounter  Medications  . simvastatin (ZOCOR) 20 MG tablet    Sig: TAKE ONE TABLET BY MOUTH ONCE DAILY AT  6PM    Dispense:  90 tablet    Refill:  1  . BAYER MICROLET LANCETS lancets    Sig: Use to check BG up to once daily.  E11.9 type 2 DM    Dispense:  100 each    Refill:  2  . glucose blood (BAYER CONTOUR NEXT TEST) test strip    Sig: Use to check BG up to once daily.  Dx:  E11.9 - type 2 DM    Dispense:  50 each    Refill:  2  . levETIRAcetam (KEPPRA) 750 MG tablet    Sig: TAKE ONE TABLET BY MOUTH ONCE DAILY IN THE MORNING AND TWO ONCE DAILY IN THE EVENING    Dispense:  270 tablet    Refill:  1  . metFORMIN (GLUCOPHAGE-XR) 500 MG 24 hr tablet    Sig: Take 1 tablet (500 mg total) by mouth daily with breakfast.  Dispense:  90 tablet    Refill:  1  . carvedilol (COREG) 6.25 MG tablet    Sig: Take 1 tablet (6.25 mg total) by mouth 2 (two) times daily with a meal.    Dispense:  60 tablet    Refill:  3     Follow-up: No Follow-up on file.  Claretta Fraise, M.D.

## 2017-04-14 NOTE — Patient Instructions (Signed)
Take half of the Carvedilol for the first week to get used to it and then start taking as prescribed.

## 2017-04-15 LAB — CBC WITH DIFFERENTIAL/PLATELET
Basophils Absolute: 0 10*3/uL (ref 0.0–0.2)
Basos: 0 %
EOS (ABSOLUTE): 0.1 10*3/uL (ref 0.0–0.4)
EOS: 2 %
HEMATOCRIT: 35.8 % (ref 34.0–46.6)
HEMOGLOBIN: 12.2 g/dL (ref 11.1–15.9)
IMMATURE GRANS (ABS): 0 10*3/uL (ref 0.0–0.1)
Immature Granulocytes: 0 %
LYMPHS: 41 %
Lymphocytes Absolute: 2.4 10*3/uL (ref 0.7–3.1)
MCH: 28.1 pg (ref 26.6–33.0)
MCHC: 34.1 g/dL (ref 31.5–35.7)
MCV: 83 fL (ref 79–97)
MONOCYTES: 7 %
Monocytes Absolute: 0.4 10*3/uL (ref 0.1–0.9)
NEUTROS ABS: 3 10*3/uL (ref 1.4–7.0)
Neutrophils: 50 %
Platelets: 321 10*3/uL (ref 150–379)
RBC: 4.34 x10E6/uL (ref 3.77–5.28)
RDW: 15 % (ref 12.3–15.4)
WBC: 5.9 10*3/uL (ref 3.4–10.8)

## 2017-04-15 LAB — LIPID PANEL
CHOL/HDL RATIO: 4.1 ratio (ref 0.0–4.4)
CHOLESTEROL TOTAL: 221 mg/dL — AB (ref 100–199)
HDL: 54 mg/dL (ref >39)
Triglycerides: 550 mg/dL — ABNORMAL HIGH (ref 0–149)

## 2017-04-15 LAB — CMP14+EGFR
ALBUMIN: 4.1 g/dL (ref 3.5–4.8)
ALK PHOS: 99 IU/L (ref 39–117)
ALT: 14 IU/L (ref 0–32)
AST: 14 IU/L (ref 0–40)
Albumin/Globulin Ratio: 1.4 (ref 1.2–2.2)
BUN / CREAT RATIO: 20 (ref 12–28)
BUN: 21 mg/dL (ref 8–27)
Bilirubin Total: <0.2 mg/dL (ref 0.0–1.2)
CO2: 25 mmol/L (ref 20–29)
Calcium: 9.7 mg/dL (ref 8.7–10.3)
Chloride: 104 mmol/L (ref 96–106)
Creatinine, Ser: 1.03 mg/dL — ABNORMAL HIGH (ref 0.57–1.00)
GFR calc non Af Amer: 54 mL/min/{1.73_m2} — ABNORMAL LOW (ref >59)
GFR, EST AFRICAN AMERICAN: 62 mL/min/{1.73_m2} (ref >59)
GLOBULIN, TOTAL: 3 g/dL (ref 1.5–4.5)
Glucose: 128 mg/dL — ABNORMAL HIGH (ref 65–99)
Potassium: 4 mmol/L (ref 3.5–5.2)
SODIUM: 143 mmol/L (ref 134–144)
TOTAL PROTEIN: 7.1 g/dL (ref 6.0–8.5)

## 2017-05-14 ENCOUNTER — Ambulatory Visit: Payer: Medicare Other | Admitting: Family Medicine

## 2017-05-19 ENCOUNTER — Encounter: Payer: Self-pay | Admitting: Family Medicine

## 2017-11-14 ENCOUNTER — Ambulatory Visit: Payer: Medicare Other | Admitting: Family Medicine

## 2017-11-17 ENCOUNTER — Ambulatory Visit: Payer: Medicare Other | Admitting: Family Medicine

## 2018-03-03 ENCOUNTER — Other Ambulatory Visit: Payer: Self-pay | Admitting: Family Medicine

## 2018-03-04 NOTE — Telephone Encounter (Signed)
Last seen 02/02/18

## 2018-03-04 NOTE — Telephone Encounter (Signed)
Authorize 30 days only. Then contact the patient letting them know that they will need an appointment before any further prescriptions can be sent in. 

## 2018-03-04 NOTE — Telephone Encounter (Signed)
lmtcb

## 2018-03-05 ENCOUNTER — Telehealth: Payer: Self-pay | Admitting: Family Medicine

## 2018-03-05 NOTE — Telephone Encounter (Signed)
What is the name of the medication? bp  Have you contacted your pharmacy to request a refill? No pt is out and has an appointment with [facility] 03/16/18  Which pharmacy would you like this sent to? [facility]   Patient notified that their request is being sent to the clinical staff for review and that they should receive a call once it is complete. If they do not receive a call within 24 hours they can check with their pharmacy or our office.

## 2018-03-05 NOTE — Telephone Encounter (Signed)
Pt aware rx sent over to pharmacy yesterday.

## 2018-03-16 ENCOUNTER — Ambulatory Visit (INDEPENDENT_AMBULATORY_CARE_PROVIDER_SITE_OTHER): Payer: Medicare Other | Admitting: Family Medicine

## 2018-03-16 ENCOUNTER — Encounter: Payer: Self-pay | Admitting: Family Medicine

## 2018-03-16 VITALS — BP 134/77 | HR 87 | Temp 97.5°F | Ht 61.0 in | Wt 163.2 lb

## 2018-03-16 DIAGNOSIS — I10 Essential (primary) hypertension: Secondary | ICD-10-CM | POA: Diagnosis not present

## 2018-03-16 DIAGNOSIS — Z23 Encounter for immunization: Secondary | ICD-10-CM | POA: Diagnosis not present

## 2018-03-16 DIAGNOSIS — E559 Vitamin D deficiency, unspecified: Secondary | ICD-10-CM

## 2018-03-16 DIAGNOSIS — E119 Type 2 diabetes mellitus without complications: Secondary | ICD-10-CM

## 2018-03-16 DIAGNOSIS — E785 Hyperlipidemia, unspecified: Secondary | ICD-10-CM | POA: Diagnosis not present

## 2018-03-16 LAB — URINALYSIS
Bilirubin, UA: NEGATIVE
Glucose, UA: NEGATIVE
Ketones, UA: NEGATIVE
Nitrite, UA: NEGATIVE
PROTEIN UA: NEGATIVE
Specific Gravity, UA: 1.025 (ref 1.005–1.030)
Urobilinogen, Ur: 0.2 mg/dL (ref 0.2–1.0)
pH, UA: 5.5 (ref 5.0–7.5)

## 2018-03-16 LAB — BAYER DCA HB A1C WAIVED: HB A1C (BAYER DCA - WAIVED): 6.2 % (ref <7.0)

## 2018-03-16 MED ORDER — ESOMEPRAZOLE MAGNESIUM 40 MG PO CPDR: DELAYED_RELEASE_CAPSULE | ORAL | 4 refills | Status: DC

## 2018-03-16 MED ORDER — SIMVASTATIN 20 MG PO TABS: ORAL_TABLET | ORAL | 1 refills | Status: DC

## 2018-03-16 MED ORDER — LEVETIRACETAM 750 MG PO TABS: ORAL_TABLET | ORAL | 1 refills | Status: DC

## 2018-03-16 MED ORDER — CARVEDILOL 6.25 MG PO TABS: ORAL_TABLET | ORAL | 1 refills | Status: DC

## 2018-03-16 MED ORDER — METFORMIN HCL ER 500 MG PO TB24: 500.0000 mg | ORAL_TABLET | Freq: Every day | ORAL | 1 refills | Status: DC

## 2018-03-16 NOTE — Progress Notes (Signed)
Subjective:  Patient ID: [name],  female    DOB: 03-18-1946  Age: 72 y.o.    CC: Medical Management of Chronic Issues   HPI Sharlee Rufino presents for  follow-up of hypertension. Patient has no history of headache chest pain or shortness of breath or recent cough. Patient also denies symptoms of TIA such as numbness weakness lateralizing. Patient denies side effects from medication. States taking it regularly.  Patient also  in for follow-up of elevated cholesterol. Doing well without complaints on current medication. Denies side effects  including myalgia and arthralgia and nausea. Also in today for liver function testing. Currently no chest pain, shortness of breath or other cardiovascular related symptoms noted.  Follow-up of diabetes. Patient does check blood sugar at home occasionally.  Readings have been a little high she tells me.  No log and no exact numbers given.  Patient denies symptoms such as excessive hunger or urinary frequency, excessive hunger, nausea No significant hypoglycemic spells noted. Medications reviewed. Pt reports taking them regularly. Pt. denies complication/adverse reaction today.    History Simrit has a past medical history of Cataract, Dementia, vascular ([place]), GERD (gastroesophageal reflux disease), Hyperlipidemia, Hypertension, Seizure disorder ([place]), and Stroke [facility]) (May 2013).   She has a past surgical history that includes Bunionectomy; Colonoscopy (10/31/2011); Esophagogastroduodenoscopy (10/31/2011); and Hemorrhoid surgery (01/31/2012).   Her family history includes Alcohol abuse in her brother; Diabetes in her mother; Hyperlipidemia in her father and mother; Hypertension in her father and mother; Stroke in her mother.She reports that she quit smoking about 6 years ago. Her smoking use included cigarettes. She has a 50.00 pack-year smoking history. She has never used smokeless tobacco. She reports that she does not drink alcohol or use drugs.  Current  Outpatient Medications on File Prior to Visit  Medication Sig Dispense Refill  . aspirin 325 MG EC tablet Take 325 mg by mouth daily.    Marland Kitchen BAYER MICROLET LANCETS lancets Use to check BG up to once daily.  E11.9 type 2 DM 100 each 2  . glucose blood (BAYER CONTOUR NEXT TEST) test strip Use to check BG up to once daily.  Dx:  E11.9 - type 2 DM 50 each 2   No current facility-administered medications on file prior to visit.     ROS Review of Systems  Constitutional: Negative.   HENT: Negative for congestion.   Eyes: Negative for visual disturbance.  Respiratory: Negative for shortness of breath.   Cardiovascular: Negative for chest pain.  Gastrointestinal: Negative for abdominal pain, constipation, diarrhea, nausea and vomiting.  Genitourinary: Negative for difficulty urinating.  Musculoskeletal: Negative for arthralgias and myalgias.  Neurological: Negative for headaches.  Psychiatric/Behavioral: Negative for sleep disturbance.    Objective:  BP 134/77   Pulse 87   Temp (!) 97.5 F (36.4 C) (Oral)   Ht _0  (1.549 m)   Wt 163 lb 3.2 oz (74 kg)   BMI 30.84 kg/m   BP Readings from Last 3 Encounters:  03/16/18 134/77  04/14/17 (!) 182/79  11/11/16 137/73    Wt Readings from Last 3 Encounters:  03/16/18 163 lb 3.2 oz (74 kg)  04/14/17 161 lb (73 kg)  11/11/16 159 lb (72.1 kg)     Physical Exam Constitutional:      General: She is not in acute distress.    Appearance: She is well-developed.  HENT:     Head: Normocephalic and atraumatic.     Right Ear: External ear normal.  Left Ear: External ear normal.     Nose: Nose normal.  Eyes:     Conjunctiva/sclera: Conjunctivae normal.     Pupils: Pupils are equal, round, and reactive to light.  Neck:     Musculoskeletal: Normal range of motion and neck supple.     Thyroid: No thyromegaly.  Cardiovascular:     Rate and Rhythm: Normal rate and regular rhythm.     Heart sounds: Normal heart sounds. No murmur.    Pulmonary:     Effort: Pulmonary effort is normal. No respiratory distress.     Breath sounds: Normal breath sounds. No wheezing or rales.  Abdominal:     General: Bowel sounds are normal. There is no distension.     Palpations: Abdomen is soft.     Tenderness: There is no abdominal tenderness.  Lymphadenopathy:     Cervical: No cervical adenopathy.  Skin:    General: Skin is warm and dry.  Neurological:     Mental Status: She is alert and oriented to person, place, and time.     Deep Tendon Reflexes: Reflexes are normal and symmetric.  Psychiatric:        Behavior: Behavior normal.        Thought Content: Thought content normal.        Judgment: Judgment normal.       Assessment & Plan:   Noemie was seen today for medical management of chronic issues.  Diagnoses and all orders for this visit:  Essential hypertension -     CBC with Differential/Platelet -     CMP14+EGFR -     Lipid panel -     Thyroid Panel With TSH -     Microalbumin / creatinine urine ratio -     Bayer DCA Hb A1c Waived -     Urinalysis -     carvedilol (COREG) 6.25 MG tablet; TAKE 1 TABLET BY MOUTH TWICE DAILY WITH A MEAL  Hyperlipidemia LDL goal <100 -     Lipid panel -     simvastatin (ZOCOR) 20 MG tablet; TAKE ONE TABLET BY MOUTH ONCE DAILY AT  6PM  Type 2 diabetes mellitus without complication, without long-term current use of insulin (HCC) -     CBC with Differential/Platelet -     CMP14+EGFR -     Lipid panel -     Thyroid Panel With TSH -     Microalbumin / creatinine urine ratio -     Bayer DCA Hb A1c Waived -     Urinalysis -     metFORMIN (GLUCOPHAGE-XR) 500 MG 24 hr tablet; Take 1 tablet (500 mg total) by mouth daily with breakfast.  Vitamin D deficiency -     VITAMIN D 25 Hydroxy (Vit-D Deficiency, Fractures)  Encounter for immunization -     Flu vaccine HIGH DOSE PF  Other orders -     levETIRAcetam (KEPPRA) 750 MG tablet; TAKE ONE TABLET BY MOUTH ONCE DAILY IN THE MORNING  AND TWO ONCE DAILY IN THE EVENING -     esomeprazole (NEXIUM) 40 MG capsule; TAKE ONE CAPSULE BY MOUTH ONCE DAILY on an empty stomach   I am having Felecity Lemaster maintain her aspirin, BAYER MICROLET LANCETS, glucose blood, simvastatin, metFORMIN, levETIRAcetam, esomeprazole, and carvedilol.  Meds ordered this encounter  Medications  . simvastatin (ZOCOR) 20 MG tablet    Sig: TAKE ONE TABLET BY MOUTH ONCE DAILY AT  6PM    Dispense:  90 tablet  Refill:  1  . metFORMIN (GLUCOPHAGE-XR) 500 MG 24 hr tablet    Sig: Take 1 tablet (500 mg total) by mouth daily with breakfast.    Dispense:  90 tablet    Refill:  1  . levETIRAcetam (KEPPRA) 750 MG tablet    Sig: TAKE ONE TABLET BY MOUTH ONCE DAILY IN THE MORNING AND TWO ONCE DAILY IN THE EVENING    Dispense:  270 tablet    Refill:  1  . esomeprazole (NEXIUM) 40 MG capsule    Sig: TAKE ONE CAPSULE BY MOUTH ONCE DAILY on an empty stomach    Dispense:  90 capsule    Refill:  4  . carvedilol (COREG) 6.25 MG tablet    Sig: TAKE 1 TABLET BY MOUTH TWICE DAILY WITH A MEAL    Dispense:  180 tablet    Refill:  1    Please consider 90 day supplies to promote better adherence     Follow-up: No follow-ups on file.  Claretta Fraise, M.D.

## 2018-03-17 ENCOUNTER — Other Ambulatory Visit: Payer: Self-pay | Admitting: *Deleted

## 2018-03-17 LAB — CMP14+EGFR
ALBUMIN: 4.4 g/dL (ref 3.5–4.8)
ALT: 12 IU/L (ref 0–32)
AST: 17 IU/L (ref 0–40)
Albumin/Globulin Ratio: 1.8 (ref 1.2–2.2)
Alkaline Phosphatase: 113 IU/L (ref 39–117)
BUN/Creatinine Ratio: 16 (ref 12–28)
BUN: 15 mg/dL (ref 8–27)
Bilirubin Total: <0.2 mg/dL (ref 0.0–1.2)
CO2: 22 mmol/L (ref 20–29)
Calcium: 9.3 mg/dL (ref 8.7–10.3)
Chloride: 103 mmol/L (ref 96–106)
Creatinine, Ser: 0.92 mg/dL (ref 0.57–1.00)
GFR calc Af Amer: 71 mL/min/{1.73_m2} (ref >59)
GFR calc non Af Amer: 62 mL/min/{1.73_m2} (ref >59)
GLOBULIN, TOTAL: 2.5 g/dL (ref 1.5–4.5)
Glucose: 95 mg/dL (ref 65–99)
Potassium: 4 mmol/L (ref 3.5–5.2)
SODIUM: 141 mmol/L (ref 134–144)
Total Protein: 6.9 g/dL (ref 6.0–8.5)

## 2018-03-17 LAB — MICROALBUMIN / CREATININE URINE RATIO
Creatinine, Urine: 152.2 mg/dL
Microalb/Creat Ratio: 10.1 mg/g creat (ref 0.0–30.0)
Microalbumin, Urine: 15.4 ug/mL

## 2018-03-17 LAB — CBC WITH DIFFERENTIAL/PLATELET
Basophils Absolute: 0 10*3/uL (ref 0.0–0.2)
Basos: 1 %
EOS (ABSOLUTE): 0.1 10*3/uL (ref 0.0–0.4)
EOS: 1 %
HEMATOCRIT: 36.2 % (ref 34.0–46.6)
Hemoglobin: 12.3 g/dL (ref 11.1–15.9)
Immature Grans (Abs): 0 10*3/uL (ref 0.0–0.1)
Immature Granulocytes: 0 %
Lymphocytes Absolute: 2.1 10*3/uL (ref 0.7–3.1)
Lymphs: 42 %
MCH: 28.2 pg (ref 26.6–33.0)
MCHC: 34 g/dL (ref 31.5–35.7)
MCV: 83 fL (ref 79–97)
MONOCYTES: 7 %
Monocytes Absolute: 0.4 10*3/uL (ref 0.1–0.9)
Neutrophils Absolute: 2.5 10*3/uL (ref 1.4–7.0)
Neutrophils: 49 %
Platelets: 306 10*3/uL (ref 150–450)
RBC: 4.36 x10E6/uL (ref 3.77–5.28)
RDW: 13.4 % (ref 12.3–15.4)
WBC: 5 10*3/uL (ref 3.4–10.8)

## 2018-03-17 LAB — VITAMIN D 25 HYDROXY (VIT D DEFICIENCY, FRACTURES): Vit D, 25-Hydroxy: <4 ng/mL — ABNORMAL LOW (ref 30.0–100.0)

## 2018-03-17 LAB — LIPID PANEL
Chol/HDL Ratio: 3.7 ratio (ref 0.0–4.4)
Cholesterol, Total: 200 mg/dL — ABNORMAL HIGH (ref 100–199)
HDL: 54 mg/dL (ref >39)
LDL CALC: 79 mg/dL (ref 0–99)
Triglycerides: 334 mg/dL — ABNORMAL HIGH (ref 0–149)
VLDL Cholesterol Cal: 67 mg/dL — ABNORMAL HIGH (ref 5–40)

## 2018-03-17 LAB — THYROID PANEL WITH TSH
Free Thyroxine Index: 2.1 (ref 1.2–4.9)
T3 Uptake Ratio: 25 % (ref 24–39)
T4, Total: 8.4 ug/dL (ref 4.5–12.0)
TSH: 1.25 u[IU]/mL (ref 0.450–4.500)

## 2018-03-17 MED ORDER — ERGOCALCIFEROL 1.25 MG (50000 UT) PO CAPS: 50000.0000 [IU] | ORAL_CAPSULE | ORAL | 1 refills | Status: DC

## 2018-06-15 ENCOUNTER — Ambulatory Visit: Payer: Medicare Other | Admitting: Family Medicine

## 2018-06-20 ENCOUNTER — Encounter: Payer: Self-pay | Admitting: Family Medicine

## 2018-07-27 ENCOUNTER — Ambulatory Visit (INDEPENDENT_AMBULATORY_CARE_PROVIDER_SITE_OTHER): Payer: Medicare Other | Admitting: Family Medicine

## 2018-07-27 ENCOUNTER — Encounter: Payer: Self-pay | Admitting: Family Medicine

## 2018-07-27 ENCOUNTER — Other Ambulatory Visit: Payer: Self-pay

## 2018-07-27 DIAGNOSIS — E119 Type 2 diabetes mellitus without complications: Secondary | ICD-10-CM | POA: Diagnosis not present

## 2018-07-27 DIAGNOSIS — I10 Essential (primary) hypertension: Secondary | ICD-10-CM

## 2018-07-27 DIAGNOSIS — E785 Hyperlipidemia, unspecified: Secondary | ICD-10-CM

## 2018-07-27 DIAGNOSIS — R569 Unspecified convulsions: Secondary | ICD-10-CM | POA: Diagnosis not present

## 2018-07-27 MED ORDER — SIMVASTATIN 20 MG PO TABS: ORAL_TABLET | ORAL | 1 refills | Status: DC

## 2018-07-27 MED ORDER — METFORMIN HCL ER 500 MG PO TB24: 500.0000 mg | ORAL_TABLET | Freq: Every day | ORAL | 1 refills | Status: DC

## 2018-07-27 MED ORDER — ESOMEPRAZOLE MAGNESIUM 40 MG PO CPDR: DELAYED_RELEASE_CAPSULE | ORAL | 4 refills | Status: DC

## 2018-07-27 MED ORDER — CARVEDILOL 6.25 MG PO TABS: ORAL_TABLET | ORAL | 1 refills | Status: DC

## 2018-07-27 MED ORDER — LEVETIRACETAM 750 MG PO TABS: ORAL_TABLET | ORAL | 1 refills | Status: DC

## 2018-07-27 NOTE — Progress Notes (Signed)
Subjective:  Patient ID: Marcine Gadway,  female    DOB: 1945-08-13  Age: 73 y.o.    CC: No chief complaint on file.   HPI Santrice Muzio presents for  follow-up of hypertension. Patient has no history of headache chest pain or shortness of breath or recent cough. Patient also denies symptoms of TIA such as numbness weakness lateralizing. Patient denies side effects from medication. States taking it regularly. Normal readings at home.Under 614 systolic.  Patient also  in for follow-up of elevated cholesterol. Doing well without complaints on current medication. Denies side effects  including myalgia and arthralgia and nausea. Also in today for liver function testing. Currently no chest pain, shortness of breath or other cardiovascular related symptoms noted.  Follow-up of diabetes. Patient does check blood sugar at home. Readings run between 100 and 128 Patient denies symptoms such as excessive hunger or urinary frequency, excessive hunger, nausea No significant hypoglycemic spells noted. Medications reviewed. Pt reports taking them regularly. Pt. denies complication/adverse reaction today.    History Daliana has a past medical history of Cataract, Dementia, vascular ([place]), GERD (gastroesophageal reflux disease), Hyperlipidemia, Hypertension, Seizure disorder ([place]), and Stroke [facility]) (May 2013).   She has a past surgical history that includes Bunionectomy; Colonoscopy (10/31/2011); Esophagogastroduodenoscopy (10/31/2011); and Hemorrhoid surgery (01/31/2012).   Her family history includes Alcohol abuse in her brother; Diabetes in her mother; Hyperlipidemia in her father and mother; Hypertension in her father and mother; Stroke in her mother.She reports that she quit smoking about 7 years ago. Her smoking use included cigarettes. She has a 50.00 pack-year smoking history. She has never used smokeless tobacco. She reports that she does not drink alcohol or use drugs.  Current Outpatient Medications on  File Prior to Visit  Medication Sig Dispense Refill  . aspirin 325 MG EC tablet Take 325 mg by mouth daily.    Marland Kitchen BAYER MICROLET LANCETS lancets Use to check BG up to once daily.  E11.9 type 2 DM 100 each 2  . ergocalciferol (VITAMIN D2) 1.25 MG (50000 UT) capsule Take 1 capsule (50,000 Units total) by mouth once a week. 13 capsule 1  . glucose blood (BAYER CONTOUR NEXT TEST) test strip Use to check BG up to once daily.  Dx:  E11.9 - type 2 DM 50 each 2   No current facility-administered medications on file prior to visit.     ROS Review of Systems  Constitutional: Negative.  Negative for chills and fever.  HENT: Negative for congestion.   Eyes: Negative for visual disturbance.  Respiratory: Negative for cough and shortness of breath.   Cardiovascular: Negative for chest pain.  Gastrointestinal: Negative for abdominal pain, constipation, diarrhea, nausea and vomiting.  Genitourinary: Negative for difficulty urinating.  Musculoskeletal: Negative for arthralgias and myalgias.  Neurological: Negative for headaches.  Psychiatric/Behavioral: Negative for sleep disturbance.    Objective:  There were no vitals taken for this visit.  BP Readings from Last 3 Encounters:  03/16/18 134/77  04/14/17 (!) 182/79  11/11/16 137/73    Wt Readings from Last 3 Encounters:  03/16/18 163 lb 3.2 oz (74 kg)  04/14/17 161 lb (73 kg)  11/11/16 159 lb (72.1 kg)     Physical Exam  Exam deferred. Pt. Harboring due to COVID 19. Phone visit performed.     Assessment & Plan:   Diagnoses and all orders for this visit:  Seizure ([place])  Essential hypertension -     carvedilol (COREG) 6.25 MG tablet; TAKE 1 TABLET BY  MOUTH TWICE DAILY WITH A MEAL  Type 2 diabetes mellitus without complication, without long-term current use of insulin (HCC) -     metFORMIN (GLUCOPHAGE-XR) 500 MG 24 hr tablet; Take 1 tablet (500 mg total) by mouth daily with breakfast.  Hyperlipidemia LDL goal <100 -      simvastatin (ZOCOR) 20 MG tablet; TAKE ONE TABLET BY MOUTH ONCE DAILY AT  6PM  Other orders -     esomeprazole (NEXIUM) 40 MG capsule; TAKE ONE CAPSULE BY MOUTH ONCE DAILY on an empty stomach -     levETIRAcetam (KEPPRA) 750 MG tablet; TAKE ONE TABLET BY MOUTH ONCE DAILY IN THE MORNING AND TWO ONCE DAILY IN THE EVENING   I am having Feliciana Narayan maintain her aspirin, Bayer Microlet Lancets, glucose blood, ergocalciferol, carvedilol, esomeprazole, levETIRAcetam, metFORMIN, and simvastatin.  Meds ordered this encounter  Medications  . carvedilol (COREG) 6.25 MG tablet    Sig: TAKE 1 TABLET BY MOUTH TWICE DAILY WITH A MEAL    Dispense:  180 tablet    Refill:  1    Please consider 90 day supplies to promote better adherence  . esomeprazole (NEXIUM) 40 MG capsule    Sig: TAKE ONE CAPSULE BY MOUTH ONCE DAILY on an empty stomach    Dispense:  90 capsule    Refill:  4  . levETIRAcetam (KEPPRA) 750 MG tablet    Sig: TAKE ONE TABLET BY MOUTH ONCE DAILY IN THE MORNING AND TWO ONCE DAILY IN THE EVENING    Dispense:  270 tablet    Refill:  1  . metFORMIN (GLUCOPHAGE-XR) 500 MG 24 hr tablet    Sig: Take 1 tablet (500 mg total) by mouth daily with breakfast.    Dispense:  90 tablet    Refill:  1  . simvastatin (ZOCOR) 20 MG tablet    Sig: TAKE ONE TABLET BY MOUTH ONCE DAILY AT  6PM    Dispense:  90 tablet    Refill:  1   Virtual Visit via telephone Note  I discussed the limitations, risks, security and privacy concerns of performing an evaluation and management service by telephone and the availability of in person appointments. I also discussed with the patient that there may be a patient responsible charge related to this service. The patient expressed understanding and agreed to proceed. Pt. Is at home. Dr. Livia Snellen is in his office.  Follow Up Instructions:   I discussed the assessment and treatment plan with the patient. The patient was provided an opportunity to ask questions and all were  answered. The patient agreed with the plan and demonstrated an understanding of the instructions.   The patient was advised to call back or seek an in-person evaluation if the symptoms worsen or if the condition fails to improve as anticipated.  Visit started: 4:50 Call ended:  5:00 Total minutes including chart review and phone contact time: 20   Follow-up: Return in about 3 months (around 10/26/2018).  Claretta Fraise, M.D.

## 2018-09-03 ENCOUNTER — Telehealth: Payer: Self-pay | Admitting: Family Medicine

## 2018-10-07 ENCOUNTER — Telehealth: Payer: Self-pay | Admitting: Family Medicine

## 2018-10-07 NOTE — Telephone Encounter (Signed)
Left detailed message on emergency contacts voicemail

## 2018-10-07 NOTE — Telephone Encounter (Signed)
No change was made.

## 2019-05-01 ENCOUNTER — Other Ambulatory Visit: Payer: Self-pay | Admitting: Family Medicine

## 2019-05-01 DIAGNOSIS — I10 Essential (primary) hypertension: Secondary | ICD-10-CM

## 2019-05-20 ENCOUNTER — Encounter: Payer: Self-pay | Admitting: Family Medicine

## 2019-05-20 ENCOUNTER — Other Ambulatory Visit: Payer: Self-pay

## 2019-05-20 ENCOUNTER — Telehealth (INDEPENDENT_AMBULATORY_CARE_PROVIDER_SITE_OTHER): Payer: Medicare Other | Admitting: Family Medicine

## 2019-05-20 DIAGNOSIS — E785 Hyperlipidemia, unspecified: Secondary | ICD-10-CM | POA: Diagnosis not present

## 2019-05-20 DIAGNOSIS — I10 Essential (primary) hypertension: Secondary | ICD-10-CM | POA: Diagnosis not present

## 2019-05-20 DIAGNOSIS — E559 Vitamin D deficiency, unspecified: Secondary | ICD-10-CM

## 2019-05-20 DIAGNOSIS — K2100 Gastro-esophageal reflux disease with esophagitis, without bleeding: Secondary | ICD-10-CM | POA: Diagnosis not present

## 2019-05-20 DIAGNOSIS — I69354 Hemiplegia and hemiparesis following cerebral infarction affecting left non-dominant side: Secondary | ICD-10-CM

## 2019-05-20 DIAGNOSIS — E119 Type 2 diabetes mellitus without complications: Secondary | ICD-10-CM

## 2019-05-20 DIAGNOSIS — G40909 Epilepsy, unspecified, not intractable, without status epilepticus: Secondary | ICD-10-CM | POA: Diagnosis not present

## 2019-05-20 DIAGNOSIS — I69398 Other sequelae of cerebral infarction: Secondary | ICD-10-CM

## 2019-05-20 MED ORDER — GLUCOSE BLOOD VI STRP: ORAL_STRIP | 2 refills | Status: AC

## 2019-05-20 MED ORDER — ERGOCALCIFEROL 1.25 MG (50000 UT) PO CAPS: 50000.0000 [IU] | ORAL_CAPSULE | ORAL | 1 refills | Status: DC

## 2019-05-20 MED ORDER — ESOMEPRAZOLE MAGNESIUM 40 MG PO CPDR: DELAYED_RELEASE_CAPSULE | ORAL | 4 refills | Status: DC

## 2019-05-20 MED ORDER — SIMVASTATIN 20 MG PO TABS: ORAL_TABLET | ORAL | 1 refills | Status: DC

## 2019-05-20 MED ORDER — CARVEDILOL 6.25 MG PO TABS: ORAL_TABLET | ORAL | 0 refills | Status: DC

## 2019-05-20 MED ORDER — LEVETIRACETAM 750 MG PO TABS: ORAL_TABLET | ORAL | 1 refills | Status: DC

## 2019-05-20 MED ORDER — METFORMIN HCL ER 500 MG PO TB24: 500.0000 mg | ORAL_TABLET | Freq: Every day | ORAL | 1 refills | Status: DC

## 2019-05-20 NOTE — Progress Notes (Signed)
Subjective:    Patient ID: [name], female    DOB: 18-May-1945, 74 y.o.   MRN: [id number]   HPI: [name] is a 74 y.o. female presenting for  presents for  follow-up of hypertension. Patient has no history of headache chest pain or shortness of breath or recent cough. Patient also denies symptoms of TIA such as focal numbness or weakness. Patient denies side effects from medication. States taking it regularly. Pt readings for today byher daughter in the home 165/76, 165/85 pulse 74 Pt. Was nervous and fidgety. She and her daughter think the readings are not accurate as a result  presents forFollow-up of diabetes.  Patient is not checking the blood sugar.  She does have a monitor and agrees to check it daily for a week and send those to me.  Patient denies symptoms such as polyuria, polydipsia, excessive hunger, nausea No significant hypoglycemic spells noted. Medications reviewed. Pt reports taking them regularly without complication/adverse reaction being reported today.   Patient in for follow-up of elevated cholesterol. Doing well without complaints on current medication. Denies side effects of statin including myalgia and arthralgia and nausea.  Currently no chest pain, shortness of breath or other cardiovascular related symptoms noted.  Patient has suffered a stroke in the past and does have some left-sided weakness.  That is stable currently.  Additionally she has not had a seizure recently but continues to take [place] in order to prevent seizures.  Patient also is seen for gastroesophageal reflux disease.  She tells me she has daily heartburn.  It is moderately severe.  No associated nausea vomiting diarrhea or melena.  However she has started using over-the-counter omeprazole 20 mg a day without regard to eating.   Depression screen [facility] 2/9 03/16/2018 04/14/2017 11/11/2016 08/05/2016 04/30/2016  Decreased Interest 0 0 0 0 0  Down, Depressed, Hopeless 0 0 0 0 0  PHQ - 2 Score 0 0 0 0  0  Altered sleeping - - - - -  Tired, decreased energy - - - - -  Change in appetite - - - - -  Feeling bad or failure about yourself  - - - - -  Trouble concentrating - - - - -  Moving slowly or fidgety/restless - - - - -  Suicidal thoughts - - - - -  PHQ-9 Score - - - - -     Relevant past medical, surgical, family and social history reviewed and updated as indicated.  Interim medical history since our last visit reviewed. Allergies and medications reviewed and updated.  ROS:  Review of Systems  Constitutional: Negative.   HENT: Negative.   Eyes: Negative for visual disturbance.  Respiratory: Negative for shortness of breath.   Cardiovascular: Negative for chest pain.  Gastrointestinal: Negative for abdominal pain.  Musculoskeletal: Negative for arthralgias.     Social History   Tobacco Use  Smoking Status Former Smoker  . Packs/day: 1.00  . Years: 50.00  . Pack years: 50.00  . Types: Cigarettes  . Quit date: 04/02/2011  . Years since quitting: 8.1  Smokeless Tobacco Never Used  Tobacco Comment   May of 2013       Objective:     Wt Readings from Last 3 Encounters:  03/16/18 163 lb 3.2 oz (74 kg)  04/14/17 161 lb (73 kg)  11/11/16 159 lb (72.1 kg)     Exam deferred. Pt. Harboring due to COVID 19. Phone visit performed.   Assessment & Plan:  1. Hemiparesis affecting left side as late effect of stroke ([place])   2. Essential hypertension   3. Type 2 diabetes mellitus without complication, without long-term current use of insulin ([place])   4. Hyperlipidemia LDL goal <100   5. Seizure disorder as sequela of cerebrovascular accident ([place])   6. Gastroesophageal reflux disease with esophagitis without hemorrhage   7. Vitamin D deficiency     Meds ordered this encounter  Medications  . carvedilol (COREG) 6.25 MG tablet    Sig: TAKE 1 TABLET BY MOUTH TWICE DAILY WITH A MEAL (Needs to be seen before next refill)    Dispense:  60 tablet    Refill:  0  .  ergocalciferol (VITAMIN D2) 1.25 MG (50000 UT) capsule    Sig: Take 1 capsule (50,000 Units total) by mouth once a week.    Dispense:  13 capsule    Refill:  1  . esomeprazole (NEXIUM) 40 MG capsule    Sig: TAKE ONE CAPSULE BY MOUTH ONCE DAILY on an empty stomach    Dispense:  90 capsule    Refill:  4  . glucose blood (BAYER CONTOUR NEXT TEST) test strip    Sig: Use to check BG up to once daily.  Dx:  E11.9 - type 2 DM    Dispense:  50 each    Refill:  2  . levETIRAcetam (KEPPRA) 750 MG tablet    Sig: TAKE ONE TABLET BY MOUTH ONCE DAILY IN THE MORNING AND TWO ONCE DAILY IN THE EVENING    Dispense:  270 tablet    Refill:  1  . metFORMIN (GLUCOPHAGE-XR) 500 MG 24 hr tablet    Sig: Take 1 tablet (500 mg total) by mouth daily with breakfast.    Dispense:  90 tablet    Refill:  1  . simvastatin (ZOCOR) 20 MG tablet    Sig: TAKE ONE TABLET BY MOUTH ONCE DAILY AT  6PM    Dispense:  90 tablet    Refill:  1    No orders of the defined types were placed in this encounter.     Diagnoses and all orders for this visit:  Hemiparesis affecting left side as late effect of stroke (HCC) -     simvastatin (ZOCOR) 20 MG tablet; TAKE ONE TABLET BY MOUTH ONCE DAILY AT  6PM  Essential hypertension -     carvedilol (COREG) 6.25 MG tablet; TAKE 1 TABLET BY MOUTH TWICE DAILY WITH A MEAL (Needs to be seen before next refill)  Type 2 diabetes mellitus without complication, without long-term current use of insulin (HCC) -     glucose blood (BAYER CONTOUR NEXT TEST) test strip; Use to check BG up to once daily.  Dx:  E11.9 - type 2 DM -     metFORMIN (GLUCOPHAGE-XR) 500 MG 24 hr tablet; Take 1 tablet (500 mg total) by mouth daily with breakfast.  Hyperlipidemia LDL goal <100 -     simvastatin (ZOCOR) 20 MG tablet; TAKE ONE TABLET BY MOUTH ONCE DAILY AT  6PM  Seizure disorder as sequela of cerebrovascular accident ([place]) -     levETIRAcetam (KEPPRA) 750 MG tablet; TAKE ONE TABLET BY MOUTH ONCE DAILY IN  THE MORNING AND TWO ONCE DAILY IN THE EVENING  Gastroesophageal reflux disease with esophagitis without hemorrhage -     esomeprazole (NEXIUM) 40 MG capsule; TAKE ONE CAPSULE BY MOUTH ONCE DAILY on an empty stomach  Vitamin D deficiency -     ergocalciferol (VITAMIN  D2) 1.25 MG (50000 UT) capsule; Take 1 capsule (50,000 Units total) by mouth once a week.  Patient agrees that we need a better handle on her blood pressure since it was high on today's readings.  She offers to check it daily for a week and send those readings in.  She admits to not checking on the blood sugar.  As a result she is willing to check those daily for a week and send them in when she sends in the blood pressure readings.  Some of her deficit may be related to residual on her memory from stroke.  Her daughter is present with her and involved in her care and encouraged to participate at that level.  Patient also suffers from a seizure disorder and is taking Keppra which has helped her seizures at [place].  She denies any sequela such as drowsiness or dizziness from this.  The gastroesophageal reflux is clearly not well controlled with her current over-the-counter regimen.  I sent in for her a refill on her Nexium prescription strength.  She can take 2 of the over-the-counter strength instead if that works out to be cheaper for her.  However she needs to be sure it is 40 mg a day.  I reviewed with her in detail the need to take it on an empty stomach including starting with nothing to eat for 2 hours before then take the medicine then wait another hour before consuming food or other medications.  She can have water as needed through that time.  This should help with the reflux.  If it is not sufficient will look at further interventions as she reports continuing symptoms.  Patient also having problems with dry skin she is gotten a new over-the-counter lotion that was highly recommended.  In addition I suggested that she use a  humidifier or vaporizer at the bedside.  This should moisturize the air and to prevent her skin from losing its natural moisture.  She should bathe in lukewarm water pat herself dry rather than than scrubbing herself off to dry herself with a towel.  Then with her body slightly damp she should go ahead and apply the new lotion.  I recommend using it at least twice a day 1 of those times being after her bath.  Virtual Visit via telephone Note  I discussed the limitations, risks, security and privacy concerns of performing an evaluation and management service by telephone and the availability of in person appointments. The patient was identified with two identifiers. Pt.expressed understanding and agreed to proceed. Pt. Is at home. Dr. Livia Snellen is in his office.  Follow Up Instructions:   I discussed the assessment and treatment plan with the patient. The patient was provided an opportunity to ask questions and all were answered. The patient agreed with the plan and demonstrated an understanding of the instructions.   The patient was advised to call back or seek an in-person evaluation if the symptoms worsen or if the condition fails to improve as anticipated.   Total minutes including chart review and phone contact time: 38   Follow up plan: Return in about 3 months (around 08/17/2019).  Claretta Fraise, MD [place]

## 2019-05-24 ENCOUNTER — Telehealth: Payer: Self-pay

## 2019-05-24 NOTE — Telephone Encounter (Signed)
Prior auth initiated through covermymeds for Esomeprazole 40mg   Key:  [employer]

## 2019-05-25 NOTE — Telephone Encounter (Signed)
Prior Auth for Esomprazole 40mg -APPROVED   Approved. This drug has been approved under the Member's Medicare Part D benefit. Approved quantity: 90 units per 90 day(s). You may fill up to a 90 day supply except for those on Specialty Tier 5, which can be filled up to a 30 day supply. Please call the pharmacy to process the prescription claim.  Pharmacy notified

## 2019-06-08 ENCOUNTER — Telehealth: Payer: Self-pay | Admitting: Family Medicine

## 2019-06-08 NOTE — Chronic Care Management (AMB) (Signed)
  Chronic Care Management   Outreach Note  06/08/2019 Name: [name] MRN: [id number] DOB: 02/26/1946  [name] is a 74 y.o. year old female who is a primary care patient of [facility], Cletus Gash, MD. I reached out to [name] by phone today in response to a referral sent by [name] health plan.     An unsuccessful telephone outreach was attempted today. The patient was referred to the case management team for assistance with care management and care coordination.   Follow Up Plan: A HIPPA compliant phone message was left for the patient providing contact information and requesting a return call.  The care management team will reach out to the patient again over the next 7 days.  If patient returns call to provider office, please advise to call [facility]  at [place], [place], [place], [place] [address] Direct Dial: [phone] [email or web address] Website: [email or web address]

## 2019-06-10 NOTE — Chronic Care Management (AMB) (Signed)
  Chronic Care Management   Outreach Note  06/10/2019 Name: Verner Tewari MRN: [id number] DOB: 07-15-1945  Marise Kosman is a 74 y.o. year old female who is a primary care patient of Stacks, Cletus Gash, MD. I reached out to [name] by phone today in response to a referral sent by [name] health plan.     A second unsuccessful telephone outreach was attempted today. The patient was referred to the case management team for assistance with care management and care coordination.   Follow Up Plan: A HIPPA compliant phone message was left for the patient providing contact information and requesting a return call.  The care management team will reach out to the patient again over the next 7 days.  If patient returns call to provider office, please advise to call [facility]  at [place], [place], [place], [place] [address] Direct Dial: [phone] [email or web address] Website: [email or web address]

## 2019-06-14 NOTE — Chronic Care Management (AMB) (Signed)
  Chronic Care Management   Outreach Note  06/14/2019 Name: [name] MRN: [id number] DOB: [birth year]  [name] is a 74 y.o. year old female who is a primary care patient of Stacks, Cletus Gash, MD. I reached out to [name] by phone today in response to a referral sent by [name] health plan.     Third unsuccessful telephone outreach was attempted today. The patient was referred to the case management team for assistance with care management and care coordination. The patient's primary care provider has been notified of our unsuccessful attempts to make or maintain contact with the patient. The care management team is pleased to engage with this patient at any time in the future should he/she be interested in assistance from the care management team.   Follow Up Plan: A HIPPA compliant phone message was left for the patient providing contact information and requesting a return call.  The care management team is available to follow up with the patient after provider conversation with the patient regarding recommendation for care management engagement and subsequent re-referral to the care management team.   Noreene Larsson, [place], [place], [place] [address] Direct Dial: [phone] [email or web address] Website: [email or web address]

## 2019-09-30 ENCOUNTER — Other Ambulatory Visit: Payer: Self-pay | Admitting: Family Medicine

## 2019-09-30 DIAGNOSIS — E559 Vitamin D deficiency, unspecified: Secondary | ICD-10-CM

## 2019-09-30 DIAGNOSIS — I10 Essential (primary) hypertension: Secondary | ICD-10-CM

## 2019-09-30 NOTE — Telephone Encounter (Signed)
30 day supply given Last OV 05/2019 Next OV not scheduled was supposed to 07/2019 ntbs for further refills

## 2019-10-29 ENCOUNTER — Other Ambulatory Visit: Payer: Self-pay | Admitting: Family Medicine

## 2019-10-29 DIAGNOSIS — I10 Essential (primary) hypertension: Secondary | ICD-10-CM

## 2019-10-29 NOTE — Telephone Encounter (Signed)
[facility]. NTBS 30 days given 10/01/19

## 2019-11-01 ENCOUNTER — Other Ambulatory Visit: Payer: Self-pay | Admitting: Family Medicine

## 2019-11-01 DIAGNOSIS — I10 Essential (primary) hypertension: Secondary | ICD-10-CM

## 2019-11-01 NOTE — Telephone Encounter (Signed)
[facility] NTBS 30 days given 10/01/19

## 2019-11-01 NOTE — Telephone Encounter (Signed)
Attempted to contact - number not a working number

## 2019-11-02 NOTE — Telephone Encounter (Signed)
Na-cb 8/3

## 2019-11-03 ENCOUNTER — Other Ambulatory Visit: Payer: Self-pay | Admitting: Family Medicine

## 2019-11-03 DIAGNOSIS — I10 Essential (primary) hypertension: Secondary | ICD-10-CM

## 2019-11-12 ENCOUNTER — Other Ambulatory Visit: Payer: Self-pay | Admitting: Family Medicine

## 2019-11-12 DIAGNOSIS — I10 Essential (primary) hypertension: Secondary | ICD-10-CM

## 2019-11-12 MED ORDER — CARVEDILOL 6.25 MG PO TABS: ORAL_TABLET | ORAL | 0 refills | Status: DC

## 2019-11-12 NOTE — Telephone Encounter (Signed)
Prescription sent in, patient's daughter aware.

## 2019-11-12 NOTE — Telephone Encounter (Signed)
  Prescription Request  11/12/2019  What is the name of the medication or equipment? Carvedilol 6.25 - Patient is out and has appt 8-23 with [facility] and needs enough called in to get her by  Have you contacted your pharmacy to request a refill? (if applicable) YES  Which pharmacy would you like this sent to? [facility] in [place]   Patient notified that their request is being sent to the clinical staff for review and that they should receive a response within 2 business days.

## 2019-11-22 ENCOUNTER — Encounter: Payer: Self-pay | Admitting: Family Medicine

## 2019-11-22 ENCOUNTER — Other Ambulatory Visit: Payer: Self-pay

## 2019-11-22 ENCOUNTER — Ambulatory Visit (INDEPENDENT_AMBULATORY_CARE_PROVIDER_SITE_OTHER): Payer: Medicare Other | Admitting: Family Medicine

## 2019-11-22 VITALS — BP 138/72 | HR 68 | Temp 97.3°F | Resp 20 | Ht 61.0 in | Wt 155.0 lb

## 2019-11-22 DIAGNOSIS — E785 Hyperlipidemia, unspecified: Secondary | ICD-10-CM | POA: Diagnosis not present

## 2019-11-22 DIAGNOSIS — I69398 Other sequelae of cerebral infarction: Secondary | ICD-10-CM

## 2019-11-22 DIAGNOSIS — I69354 Hemiplegia and hemiparesis following cerebral infarction affecting left non-dominant side: Secondary | ICD-10-CM | POA: Diagnosis not present

## 2019-11-22 DIAGNOSIS — I10 Essential (primary) hypertension: Secondary | ICD-10-CM | POA: Diagnosis not present

## 2019-11-22 DIAGNOSIS — F0150 Vascular dementia without behavioral disturbance: Secondary | ICD-10-CM | POA: Diagnosis not present

## 2019-11-22 DIAGNOSIS — R569 Unspecified convulsions: Secondary | ICD-10-CM | POA: Diagnosis not present

## 2019-11-22 DIAGNOSIS — E119 Type 2 diabetes mellitus without complications: Secondary | ICD-10-CM

## 2019-11-22 DIAGNOSIS — K2100 Gastro-esophageal reflux disease with esophagitis, without bleeding: Secondary | ICD-10-CM

## 2019-11-22 DIAGNOSIS — G40909 Epilepsy, unspecified, not intractable, without status epilepticus: Secondary | ICD-10-CM

## 2019-11-22 DIAGNOSIS — E559 Vitamin D deficiency, unspecified: Secondary | ICD-10-CM

## 2019-11-22 LAB — BAYER DCA HB A1C WAIVED: HB A1C (BAYER DCA - WAIVED): 6.4 % (ref <7.0)

## 2019-11-22 MED ORDER — METFORMIN HCL ER 500 MG PO TB24: 500.0000 mg | ORAL_TABLET | Freq: Every day | ORAL | 1 refills | Status: DC

## 2019-11-22 MED ORDER — CARVEDILOL 6.25 MG PO TABS: ORAL_TABLET | ORAL | 1 refills | Status: DC

## 2019-11-22 MED ORDER — SIMVASTATIN 20 MG PO TABS: ORAL_TABLET | ORAL | 1 refills | Status: DC

## 2019-11-22 MED ORDER — ESOMEPRAZOLE MAGNESIUM 40 MG PO CPDR: DELAYED_RELEASE_CAPSULE | ORAL | 4 refills | Status: DC

## 2019-11-22 MED ORDER — CARVEDILOL 6.25 MG PO TABS: ORAL_TABLET | ORAL | 0 refills | Status: DC

## 2019-11-22 NOTE — Progress Notes (Signed)
Subjective:  Patient ID: [name],  female    DOB: 1945/04/05  Age: 74 y.o.    CC: Medical Management of Chronic Issues   HPI [name] presents for  follow-up of hypertension. Patient has no history of headache chest pain or shortness of breath or recent cough. Patient also denies symptoms of TIA such as numbness weakness lateralizing. Patient denies side effects from medication. States taking it regularly.  Patient also  in for follow-up of elevated cholesterol. Doing well without complaints on current medication. Denies side effects  including myalgia and arthralgia and nausea. Also in today for liver function testing. Currently no chest pain, shortness of breath or other cardiovascular related symptoms noted.  Follow-up of diabetes. Patient does not check blood sugar at home. Recently moved and got off track with that. Patient denies symptoms such as excessive hunger or urinary frequency, excessive hunger, nausea No significant hypoglycemic spells noted. Medications reviewed. Pt reports taking them regularly. Pt. denies complication/adverse reaction today.    History [name] has a past medical history of Cataract, Dementia, vascular ([place]), GERD (gastroesophageal reflux disease), Hyperlipidemia, Hypertension, Seizure disorder ([place]), and Stroke [name]) (May 2013).   She has a past surgical history that includes Bunionectomy; Colonoscopy (10/31/2011); Esophagogastroduodenoscopy (10/31/2011); and Hemorrhoid surgery (01/31/2012).   Her family history includes Alcohol abuse in her brother; Diabetes in her mother; Hyperlipidemia in her father and mother; Hypertension in her father and mother; Stroke in her mother.She reports that she quit smoking about 8 years ago. Her smoking use included cigarettes. She has a 50.00 pack-year smoking history. She has never used smokeless tobacco. She reports that she does not drink alcohol and does not use drugs.  Current Outpatient Medications on File Prior  to Visit  Medication Sig Dispense Refill  . Ascorbic Acid (VITAMIN C) 100 MG tablet Take 100 mg by mouth daily.    Marland Kitchen aspirin 325 MG EC tablet Take 325 mg by mouth daily.    Marland Kitchen glucose blood (BAYER CONTOUR NEXT TEST) test strip Use to check BG up to once daily.  Dx:  E11.9 - type 2 DM 50 each 2  . levETIRAcetam (KEPPRA) 750 MG tablet TAKE ONE TABLET BY MOUTH ONCE DAILY IN THE MORNING AND TWO ONCE DAILY IN THE EVENING (Patient taking differently: Take 750 mg by mouth 2 (two) times daily. TAKE ONE TABLET BY MOUTH ONCE DAILY IN THE MORNING AND TWO ONCE DAILY IN THE EVENING) 270 tablet 1  . Multiple Vitamins-Minerals (MULTIVITAMIN ADULT) CHEW Chew by mouth.    . Vitamin D, Ergocalciferol, (DRISDOL) 1.25 MG (50000 UNIT) CAPS capsule Take 1 capsule (50,000 Units total) by mouth once a week. Needs appointment for further refills 4 capsule 0  . zinc gluconate 50 MG tablet Take 50 mg by mouth daily.     No current facility-administered medications on file prior to visit.    ROS Review of Systems  Constitutional: Negative.   HENT: Negative for congestion.   Eyes: Negative for visual disturbance.  Respiratory: Negative for shortness of breath.   Cardiovascular: Negative for chest pain.  Gastrointestinal: Negative for abdominal pain, constipation, diarrhea, nausea and vomiting.  Genitourinary: Negative for difficulty urinating.  Musculoskeletal: Negative for arthralgias and myalgias.  Skin:       Her toenails are very long and she wants to have them trimmed by a podiatrist. She requests referral.  Neurological: Negative for headaches.  Psychiatric/Behavioral: Negative for sleep disturbance.    Objective:  BP 138/72   Pulse 68  Temp (!) 97.3 F (36.3 C) (Temporal)   Resp 20   Ht 5' 1" (1.549 m)   Wt 155 lb (70.3 kg)   SpO2 100%   BMI 29.29 kg/m   BP Readings from Last 3 Encounters:  11/22/19 138/72  03/16/18 134/77  04/14/17 (!) 182/79    Wt Readings from Last 3 Encounters:    11/22/19 155 lb (70.3 kg)  03/16/18 163 lb 3.2 oz (74 kg)  04/14/17 161 lb (73 kg)     Physical Exam  Diabetic Foot Exam - Simple   No data filed        Assessment & Plan:   [name] was seen today for medical management of chronic issues.  Diagnoses and all orders for this visit:  Essential hypertension -     CBC with Differential/Platelet -     CMP14+EGFR -     Lipid panel -     Discontinue: carvedilol (COREG) 6.25 MG tablet; Take one tablet twice daily with a meal -     carvedilol (COREG) 6.25 MG tablet; Take one tablet twice daily with a meal  Type 2 diabetes mellitus without complication, without long-term current use of insulin (HCC) -     Bayer DCA Hb A1c Waived -     Microalbumin / creatinine urine ratio -     CBC with Differential/Platelet -     CMP14+EGFR -     Lipid panel -     metFORMIN (GLUCOPHAGE-XR) 500 MG 24 hr tablet; Take 1 tablet (500 mg total) by mouth daily with breakfast. -     Ambulatory referral to Podiatry  Hyperlipidemia LDL goal <100 -     CBC with Differential/Platelet -     CMP14+EGFR -     Lipid panel -     simvastatin (ZOCOR) 20 MG tablet; TAKE ONE TABLET BY MOUTH ONCE DAILY AT  6PM  Vascular dementia without behavioral disturbance (HCC)  Gastroesophageal reflux disease with esophagitis without hemorrhage -     esomeprazole (NEXIUM) 40 MG capsule; TAKE ONE CAPSULE BY MOUTH ONCE DAILY on an empty stomach  Seizure (HCC) -     Levetiracetam level  Hemiparesis affecting left side as late effect of stroke (HCC) -     simvastatin (ZOCOR) 20 MG tablet; TAKE ONE TABLET BY MOUTH ONCE DAILY AT  6PM  Vitamin D deficiency -     VITAMIN D 25 Hydroxy (Vit-D Deficiency, Fractures)  Seizure disorder as sequela of cerebrovascular accident ([place])   I am having [name] maintain her aspirin, glucose blood, levETIRAcetam, Vitamin D (Ergocalciferol), vitamin C, zinc gluconate, Multivitamin Adult, esomeprazole, metFORMIN, simvastatin, and  carvedilol.  Meds ordered this encounter  Medications  . DISCONTD: carvedilol (COREG) 6.25 MG tablet    Sig: Take one tablet twice daily with a meal    Dispense:  60 tablet    Refill:  0  . esomeprazole (NEXIUM) 40 MG capsule    Sig: TAKE ONE CAPSULE BY MOUTH ONCE DAILY on an empty stomach    Dispense:  90 capsule    Refill:  4  . metFORMIN (GLUCOPHAGE-XR) 500 MG 24 hr tablet    Sig: Take 1 tablet (500 mg total) by mouth daily with breakfast.    Dispense:  90 tablet    Refill:  1  . simvastatin (ZOCOR) 20 MG tablet    Sig: TAKE ONE TABLET BY MOUTH ONCE DAILY AT  6PM    Dispense:  90 tablet    Refill:  1  .  carvedilol (COREG) 6.25 MG tablet    Sig: Take one tablet twice daily with a meal    Dispense:  180 tablet    Refill:  1    This is an updated scrip to replace the one I just sent so that pt. Can have a 90 day supply. Thanks!     Follow-up: Return in about 3 months (around 02/22/2020).  Claretta Fraise, M.D.

## 2019-11-23 ENCOUNTER — Other Ambulatory Visit: Payer: Self-pay | Admitting: Family Medicine

## 2019-11-23 LAB — MICROALBUMIN / CREATININE URINE RATIO
Creatinine, Urine: 235.8 mg/dL
Microalb/Creat Ratio: 2 mg/g creat (ref 0–29)
Microalbumin, Urine: 5.8 ug/mL

## 2019-11-23 MED ORDER — ROSUVASTATIN CALCIUM 20 MG PO TABS: 20.0000 mg | ORAL_TABLET | Freq: Every day | ORAL | 3 refills | Status: DC

## 2019-11-23 MED ORDER — VITAMIN D (ERGOCALCIFEROL) 1.25 MG (50000 UNIT) PO CAPS: 50000.0000 [IU] | ORAL_CAPSULE | ORAL | 3 refills | Status: DC

## 2019-11-25 LAB — LEVETIRACETAM LEVEL: Levetiracetam Lvl: 26.5 ug/mL (ref 10.0–40.0)

## 2019-11-25 LAB — CMP14+EGFR
ALT: 16 IU/L (ref 0–32)
AST: 18 IU/L (ref 0–40)
Albumin/Globulin Ratio: 1.6 (ref 1.2–2.2)
Albumin: 4.4 g/dL (ref 3.7–4.7)
Alkaline Phosphatase: 90 IU/L (ref 48–121)
BUN/Creatinine Ratio: 15 (ref 12–28)
BUN: 17 mg/dL (ref 8–27)
Bilirubin Total: <0.2 mg/dL (ref 0.0–1.2)
CO2: 28 mmol/L (ref 20–29)
Calcium: 9.7 mg/dL (ref 8.7–10.3)
Chloride: 103 mmol/L (ref 96–106)
Creatinine, Ser: 1.1 mg/dL — ABNORMAL HIGH (ref 0.57–1.00)
GFR calc Af Amer: 57 mL/min/{1.73_m2} — ABNORMAL LOW (ref >59)
GFR calc non Af Amer: 49 mL/min/{1.73_m2} — ABNORMAL LOW (ref >59)
Globulin, Total: 2.7 g/dL (ref 1.5–4.5)
Glucose: 126 mg/dL — ABNORMAL HIGH (ref 65–99)
Potassium: 4.9 mmol/L (ref 3.5–5.2)
Sodium: 142 mmol/L (ref 134–144)
Total Protein: 7.1 g/dL (ref 6.0–8.5)

## 2019-11-25 LAB — LIPID PANEL
Chol/HDL Ratio: 3.7 ratio (ref 0.0–4.4)
Cholesterol, Total: 202 mg/dL — ABNORMAL HIGH (ref 100–199)
HDL: 55 mg/dL (ref >39)
LDL Chol Calc (NIH): 116 mg/dL — ABNORMAL HIGH (ref 0–99)
Triglycerides: 178 mg/dL — ABNORMAL HIGH (ref 0–149)
VLDL Cholesterol Cal: 31 mg/dL (ref 5–40)

## 2019-11-25 LAB — CBC WITH DIFFERENTIAL/PLATELET
Basophils Absolute: 0 10*3/uL (ref 0.0–0.2)
Basos: 1 %
EOS (ABSOLUTE): 0.1 10*3/uL (ref 0.0–0.4)
Eos: 2 %
Hematocrit: 38.4 % (ref 34.0–46.6)
Hemoglobin: 12.8 g/dL (ref 11.1–15.9)
Immature Grans (Abs): 0 10*3/uL (ref 0.0–0.1)
Immature Granulocytes: 0 %
Lymphocytes Absolute: 2.3 10*3/uL (ref 0.7–3.1)
Lymphs: 45 %
MCH: 28.4 pg (ref 26.6–33.0)
MCHC: 33.3 g/dL (ref 31.5–35.7)
MCV: 85 fL (ref 79–97)
Monocytes Absolute: 0.4 10*3/uL (ref 0.1–0.9)
Monocytes: 7 %
Neutrophils Absolute: 2.2 10*3/uL (ref 1.4–7.0)
Neutrophils: 45 %
Platelets: 313 10*3/uL (ref 150–450)
RBC: 4.5 x10E6/uL (ref 3.77–5.28)
RDW: 13.3 % (ref 11.7–15.4)
WBC: 5 10*3/uL (ref 3.4–10.8)

## 2019-11-25 LAB — VITAMIN D 25 HYDROXY (VIT D DEFICIENCY, FRACTURES): Vit D, 25-Hydroxy: 16.9 ng/mL — ABNORMAL LOW (ref 30.0–100.0)

## 2020-02-22 ENCOUNTER — Ambulatory Visit: Payer: Medicare Other | Admitting: Family Medicine

## 2020-02-22 ENCOUNTER — Encounter: Payer: Self-pay | Admitting: Family Medicine

## 2020-02-22 DIAGNOSIS — E785 Hyperlipidemia, unspecified: Secondary | ICD-10-CM

## 2020-02-22 DIAGNOSIS — E119 Type 2 diabetes mellitus without complications: Secondary | ICD-10-CM

## 2020-02-22 DIAGNOSIS — I10 Essential (primary) hypertension: Secondary | ICD-10-CM

## 2020-03-07 ENCOUNTER — Ambulatory Visit (INDEPENDENT_AMBULATORY_CARE_PROVIDER_SITE_OTHER): Payer: Medicare Other | Admitting: Family Medicine

## 2020-03-07 ENCOUNTER — Other Ambulatory Visit: Payer: Self-pay

## 2020-03-07 ENCOUNTER — Encounter: Payer: Self-pay | Admitting: Family Medicine

## 2020-03-07 VITALS — Temp 97.2°F | Resp 20 | Ht 61.0 in | Wt 153.2 lb

## 2020-03-07 DIAGNOSIS — I69398 Other sequelae of cerebral infarction: Secondary | ICD-10-CM

## 2020-03-07 DIAGNOSIS — E785 Hyperlipidemia, unspecified: Secondary | ICD-10-CM | POA: Diagnosis not present

## 2020-03-07 DIAGNOSIS — E119 Type 2 diabetes mellitus without complications: Secondary | ICD-10-CM | POA: Diagnosis not present

## 2020-03-07 DIAGNOSIS — G40909 Epilepsy, unspecified, not intractable, without status epilepticus: Secondary | ICD-10-CM | POA: Diagnosis not present

## 2020-03-07 DIAGNOSIS — Z23 Encounter for immunization: Secondary | ICD-10-CM | POA: Diagnosis not present

## 2020-03-07 DIAGNOSIS — I10 Essential (primary) hypertension: Secondary | ICD-10-CM | POA: Diagnosis not present

## 2020-03-07 LAB — BAYER DCA HB A1C WAIVED: HB A1C (BAYER DCA - WAIVED): 6.3 % (ref <7.0)

## 2020-03-08 LAB — CMP14+EGFR
ALT: 13 IU/L (ref 0–32)
AST: 16 IU/L (ref 0–40)
Albumin/Globulin Ratio: 1.6 (ref 1.2–2.2)
Albumin: 4.5 g/dL (ref 3.7–4.7)
Alkaline Phosphatase: 97 IU/L (ref 44–121)
BUN/Creatinine Ratio: 16 (ref 12–28)
BUN: 17 mg/dL (ref 8–27)
Bilirubin Total: <0.2 mg/dL (ref 0.0–1.2)
CO2: 27 mmol/L (ref 20–29)
Calcium: 9.8 mg/dL (ref 8.7–10.3)
Chloride: 103 mmol/L (ref 96–106)
Creatinine, Ser: 1.04 mg/dL — ABNORMAL HIGH (ref 0.57–1.00)
GFR calc Af Amer: 61 mL/min/{1.73_m2} (ref >59)
GFR calc non Af Amer: 53 mL/min/{1.73_m2} — ABNORMAL LOW (ref >59)
Globulin, Total: 2.8 g/dL (ref 1.5–4.5)
Glucose: 128 mg/dL — ABNORMAL HIGH (ref 65–99)
Potassium: 4.1 mmol/L (ref 3.5–5.2)
Sodium: 143 mmol/L (ref 134–144)
Total Protein: 7.3 g/dL (ref 6.0–8.5)

## 2020-03-08 LAB — CBC WITH DIFFERENTIAL/PLATELET
Basophils Absolute: 0 10*3/uL (ref 0.0–0.2)
Basos: 1 %
EOS (ABSOLUTE): 0.1 10*3/uL (ref 0.0–0.4)
Eos: 2 %
Hematocrit: 40.4 % (ref 34.0–46.6)
Hemoglobin: 13.1 g/dL (ref 11.1–15.9)
Immature Grans (Abs): 0 10*3/uL (ref 0.0–0.1)
Immature Granulocytes: 0 %
Lymphocytes Absolute: 3 10*3/uL (ref 0.7–3.1)
Lymphs: 50 %
MCH: 27.9 pg (ref 26.6–33.0)
MCHC: 32.4 g/dL (ref 31.5–35.7)
MCV: 86 fL (ref 79–97)
Monocytes Absolute: 0.4 10*3/uL (ref 0.1–0.9)
Monocytes: 7 %
Neutrophils Absolute: 2.3 10*3/uL (ref 1.4–7.0)
Neutrophils: 40 %
Platelets: 330 10*3/uL (ref 150–450)
RBC: 4.69 x10E6/uL (ref 3.77–5.28)
RDW: 13.1 % (ref 11.7–15.4)
WBC: 5.9 10*3/uL (ref 3.4–10.8)

## 2020-03-10 ENCOUNTER — Encounter: Payer: Self-pay | Admitting: Family Medicine

## 2020-03-10 MED ORDER — CARVEDILOL 6.25 MG PO TABS: ORAL_TABLET | ORAL | 1 refills | Status: DC

## 2020-03-10 MED ORDER — LEVETIRACETAM 750 MG PO TABS: 750.0000 mg | ORAL_TABLET | Freq: Two times a day (BID) | ORAL | 1 refills | Status: DC

## 2020-03-10 MED ORDER — METFORMIN HCL ER 500 MG PO TB24: 500.0000 mg | ORAL_TABLET | Freq: Every day | ORAL | 1 refills | Status: DC

## 2020-03-10 NOTE — Progress Notes (Signed)
Subjective:  Patient ID: [name], female    DOB: 03-13-1946  Age: 74 y.o. MRN: [id number]  CC: No chief complaint on file.   HPI [name] presents forFollow-up of diabetes. Patient checks blood sugar at home.   not checking fasting and states  Postprandial readings are good.  Patient denies symptoms such as polyuria, polydipsia, excessive hunger, nausea No significant hypoglycemic spells noted. Medications reviewed. Pt reports taking them regularly without complication/adverse reaction being reported today.    History [name] has a past medical history of Cataract, Dementia, vascular ([place]), GERD (gastroesophageal reflux disease), Hyperlipidemia, Hypertension, Seizure disorder ([place]), and Stroke [name]) (May 2013).   She has a past surgical history that includes Bunionectomy; Colonoscopy (10/31/2011); Esophagogastroduodenoscopy (10/31/2011); and Hemorrhoid surgery (01/31/2012).   Her family history includes Alcohol abuse in her brother; Diabetes in her mother; Hyperlipidemia in her father and mother; Hypertension in her father and mother; Stroke in her mother.She reports that she quit smoking about 8 years ago. Her smoking use included cigarettes. She has a 50.00 pack-year smoking history. She has never used smokeless tobacco. She reports that she does not drink alcohol and does not use drugs.  Current Outpatient Medications on File Prior to Visit  Medication Sig Dispense Refill  . Ascorbic Acid (VITAMIN C) 100 MG tablet Take 100 mg by mouth daily.    [name] aspirin 325 MG EC tablet Take 325 mg by mouth daily.    [name] esomeprazole (NEXIUM) 40 MG capsule TAKE ONE CAPSULE BY MOUTH ONCE DAILY on an empty stomach 90 capsule 4  . glucose blood (BAYER CONTOUR NEXT TEST) test strip Use to check BG up to once daily.  Dx:  E11.9 - type 2 DM 50 each 2  . Multiple Vitamins-Minerals (MULTIVITAMIN ADULT) CHEW Chew by mouth.    . rosuvastatin (CRESTOR) 20 MG tablet Take 1 tablet (20 mg total) by mouth daily. For  cholesterol 90 tablet 3  . Vitamin D, Ergocalciferol, (DRISDOL) 1.25 MG (50000 UNIT) CAPS capsule Take 1 capsule (50,000 Units total) by mouth every 7 (seven) days. 13 capsule 3  . zinc gluconate 50 MG tablet Take 50 mg by mouth daily.     No current facility-administered medications on file prior to visit.    ROS Review of Systems  Objective:  Temp (!) 97.2 F (36.2 C) (Temporal)   Resp 20   Ht _0  (1.549 m)   Wt 153 lb 4 oz (69.5 kg)   SpO2 97%   BMI 28.96 kg/m   BP Readings from Last 3 Encounters:  11/22/19 138/72  03/16/18 134/77  04/14/17 (!) 182/79    Wt Readings from Last 3 Encounters:  03/07/20 153 lb 4 oz (69.5 kg)  11/22/19 155 lb (70.3 kg)  03/16/18 163 lb 3.2 oz (74 kg)     Physical Exam    Assessment & Plan:   Diagnoses and all orders for this visit:  Essential hypertension -     Bayer DCA Hb A1c Waived -     CBC with Differential/Platelet -     CMP14+EGFR -     carvedilol (COREG) 6.25 MG tablet; Take one tablet twice daily with a meal  Type 2 diabetes mellitus without complication, without long-term current use of insulin (HCC) -     Bayer DCA Hb A1c Waived -     CBC with Differential/Platelet -     CMP14+EGFR -     metFORMIN (GLUCOPHAGE-XR) 500 MG 24 hr tablet; Take 1 tablet (500 mg  total) by mouth daily with breakfast.  Hyperlipidemia LDL goal <100 -     Bayer DCA Hb A1c Waived -     CBC with Differential/Platelet -     CMP14+EGFR  Need for immunization against influenza -     Flu Vaccine QUAD High Dose(Fluad)  Seizure disorder as sequela of cerebrovascular accident ([place]) -     levETIRAcetam (KEPPRA) 750 MG tablet; Take 1 tablet (750 mg total) by mouth 2 (two) times daily. TAKE ONE TABLET BY MOUTH ONCE DAILY IN THE MORNING AND TWO ONCE DAILY IN THE EVENING      I have changed [name]'s levETIRAcetam. I am also having her maintain her aspirin, glucose blood, vitamin C, zinc gluconate, Multivitamin Adult, esomeprazole,  rosuvastatin, Vitamin D (Ergocalciferol), carvedilol, and metFORMIN.  Meds ordered this encounter  Medications  . carvedilol (COREG) 6.25 MG tablet    Sig: Take one tablet twice daily with a meal    Dispense:  180 tablet    Refill:  1    This is an updated scrip to replace the one I just sent so that pt. Can have a 90 day supply. Thanks!  [name] levETIRAcetam (KEPPRA) 750 MG tablet    Sig: Take 1 tablet (750 mg total) by mouth 2 (two) times daily. TAKE ONE TABLET BY MOUTH ONCE DAILY IN THE MORNING AND TWO ONCE DAILY IN THE EVENING    Dispense:  270 tablet    Refill:  1  . metFORMIN (GLUCOPHAGE-XR) 500 MG 24 hr tablet    Sig: Take 1 tablet (500 mg total) by mouth daily with breakfast.    Dispense:  90 tablet    Refill:  1     Follow-up: Return in about 3 months (around 06/05/2020).  Claretta Fraise, M.D.

## 2020-03-10 NOTE — Progress Notes (Signed)
Hello [name],  Your lab result is normal and/or stable.Some minor variations that are not significant are commonly marked abnormal, but do not represent any medical problem for you.  Best regards, Claretta Fraise, M.D.

## 2020-05-03 ENCOUNTER — Other Ambulatory Visit: Payer: Self-pay | Admitting: Family Medicine

## 2020-05-03 DIAGNOSIS — E119 Type 2 diabetes mellitus without complications: Secondary | ICD-10-CM

## 2020-06-05 ENCOUNTER — Other Ambulatory Visit: Payer: Self-pay

## 2020-06-05 ENCOUNTER — Ambulatory Visit (INDEPENDENT_AMBULATORY_CARE_PROVIDER_SITE_OTHER): Payer: Medicare Other | Admitting: Family Medicine

## 2020-06-05 ENCOUNTER — Encounter: Payer: Self-pay | Admitting: Family Medicine

## 2020-06-05 VITALS — BP 187/94 | HR 79 | Temp 97.7°F | Ht 61.0 in | Wt 153.2 lb

## 2020-06-05 DIAGNOSIS — Z1159 Encounter for screening for other viral diseases: Secondary | ICD-10-CM | POA: Diagnosis not present

## 2020-06-05 DIAGNOSIS — E119 Type 2 diabetes mellitus without complications: Secondary | ICD-10-CM

## 2020-06-05 DIAGNOSIS — G40909 Epilepsy, unspecified, not intractable, without status epilepticus: Secondary | ICD-10-CM

## 2020-06-05 DIAGNOSIS — K2100 Gastro-esophageal reflux disease with esophagitis, without bleeding: Secondary | ICD-10-CM

## 2020-06-05 DIAGNOSIS — E785 Hyperlipidemia, unspecified: Secondary | ICD-10-CM

## 2020-06-05 DIAGNOSIS — I10 Essential (primary) hypertension: Secondary | ICD-10-CM

## 2020-06-05 DIAGNOSIS — I69398 Other sequelae of cerebral infarction: Secondary | ICD-10-CM

## 2020-06-05 LAB — BAYER DCA HB A1C WAIVED: HB A1C (BAYER DCA - WAIVED): 6.5 % (ref <7.0)

## 2020-06-05 MED ORDER — ESOMEPRAZOLE MAGNESIUM 40 MG PO CPDR: DELAYED_RELEASE_CAPSULE | ORAL | 0 refills | Status: DC

## 2020-06-05 MED ORDER — METFORMIN HCL ER 500 MG PO TB24: 500.0000 mg | ORAL_TABLET | Freq: Every day | ORAL | 0 refills | Status: DC

## 2020-06-05 MED ORDER — LEVETIRACETAM 750 MG PO TABS: 750.0000 mg | ORAL_TABLET | Freq: Two times a day (BID) | ORAL | 0 refills | Status: DC

## 2020-06-05 MED ORDER — CARVEDILOL 6.25 MG PO TABS: ORAL_TABLET | ORAL | 0 refills | Status: DC

## 2020-06-05 MED ORDER — VITAMIN D (ERGOCALCIFEROL) 1.25 MG (50000 UNIT) PO CAPS: 50000.0000 [IU] | ORAL_CAPSULE | ORAL | 0 refills | Status: DC

## 2020-06-05 MED ORDER — ROSUVASTATIN CALCIUM 20 MG PO TABS: 20.0000 mg | ORAL_TABLET | Freq: Every day | ORAL | 0 refills | Status: DC

## 2020-06-05 NOTE — Progress Notes (Signed)
Subjective:  Patient ID: [name],  female    DOB: January 15, 1946  Age: 75 y.o.    CC: Medical Management of Chronic Issues   HPI [name] presents for  follow-up of hypertension. Patient has no history of headache chest pain or shortness of breath or recent cough. Patient also denies symptoms of TIA such as numbness weakness lateralizing. Patient denies side effects from medication. States taking it regularly.  Patient also  in for follow-up of elevated cholesterol. Doing well without complaints on current medication. Denies side effects  including myalgia and arthralgia and nausea. Also in today for liver function testing. Currently no chest pain, shortness of breath or other cardiovascular related symptoms noted.  Follow-up of diabetes. Patient does check blood sugar at home.  No readings are available today.  [name] denies highs and lows. Patient denies symptoms such as excessive hunger or urinary frequency, excessive hunger, nausea No significant hypoglycemic spells noted. Medications reviewed. Pt reports taking them regularly. Pt. denies complication/adverse reaction today.   [name] takes [place] for seizure disorder.  [name] denies any recent seizure activity.  [name] does need refills on her medication.  [name] has a past medical history of Cataract, Dementia, vascular ([place]), GERD (gastroesophageal reflux disease), Hyperlipidemia, Hypertension, Seizure disorder [facility]), and Stroke [facility]) (May 2013).   [name] has a past surgical history that includes Bunionectomy; Colonoscopy (10/31/2011); Esophagogastroduodenoscopy (10/31/2011); and Hemorrhoid surgery (01/31/2012).   Her family history includes Alcohol abuse in her brother; Diabetes in her mother; Hyperlipidemia in her father and mother; Hypertension in her father and mother; Stroke in her mother.[name] reports that [name] quit smoking about 9 years ago. Her smoking use included cigarettes. [name] has a 50.00 pack-year smoking history. [name] has never used  smokeless tobacco. [name] reports that [name] does not drink alcohol and does not use drugs.  Current Outpatient Medications on File Prior to Visit  Medication Sig Dispense Refill  . Ascorbic Acid (VITAMIN C) 100 MG tablet Take 100 mg by mouth daily.    Marland Kitchen aspirin 325 MG EC tablet Take 325 mg by mouth daily.    Marland Kitchen glucose blood (BAYER CONTOUR NEXT TEST) test strip Use to check BG up to once daily.  Dx:  E11.9 - type 2 DM 50 each 2  . Multiple Vitamins-Minerals (MULTIVITAMIN ADULT) CHEW Chew by mouth.    . zinc gluconate 50 MG tablet Take 50 mg by mouth daily.     No current facility-administered medications on file prior to visit.    ROS Review of Systems  Constitutional: Negative.   HENT: Negative.   Eyes: Negative for visual disturbance.  Respiratory: Negative for shortness of breath.   Cardiovascular: Negative for chest pain.  Gastrointestinal: Negative for abdominal pain.  Musculoskeletal: Negative for arthralgias.    Objective:  BP (!) 187/94   Pulse 79   Temp 97.7 F (36.5 C)   Ht 5' 1"  (1.549 m)   Wt 153 lb 3.2 oz (69.5 kg)   SpO2 99%   BMI 28.95 kg/m   BP Readings from Last 3 Encounters:  06/05/20 (!) 187/94  11/22/19 138/72  03/16/18 134/77    Wt Readings from Last 3 Encounters:  06/05/20 153 lb 3.2 oz (69.5 kg)  03/07/20 153 lb 4 oz (69.5 kg)  11/22/19 155 lb (70.3 kg)     Physical Exam  Diabetic Foot Exam - Simple   Simple Foot Form Diabetic Foot exam was performed with the following findings: Yes 06/05/2020  3:37 PM  Visual  Inspection See comments: Yes Sensation Testing Intact to touch and monofilament testing bilaterally: Yes Pulse Check Posterior Tibialis and Dorsalis pulse intact bilaterally: Yes Comments Bunion noted at right first MTP. The right 4th toe crosses under the right third toe.        Assessment & Plan:   [name] was seen today for medical management of chronic issues.  Diagnoses and all orders for this visit:  Essential  hypertension -     CBC with Differential/Platelet -     CMP14+EGFR -     Lipid panel -     carvedilol (COREG) 6.25 MG tablet; Take one tablet twice daily with a meal  Type 2 diabetes mellitus without complication, without long-term current use of insulin (HCC) -     Bayer DCA Hb A1c Waived -     CBC with Differential/Platelet -     CMP14+EGFR -     Lipid panel -     metFORMIN (GLUCOPHAGE-XR) 500 MG 24 hr tablet; Take 1 tablet (500 mg total) by mouth daily with breakfast.  Hyperlipidemia LDL goal <100 -     CBC with Differential/Platelet -     CMP14+EGFR -     Lipid panel  Need for hepatitis C screening test -     Hepatitis C antibody  Gastroesophageal reflux disease with esophagitis without hemorrhage -     esomeprazole (NEXIUM) 40 MG capsule; TAKE ONE CAPSULE BY MOUTH ONCE DAILY on an empty stomach  Seizure disorder as sequela of cerebrovascular accident ([place]) -     levETIRAcetam (KEPPRA) 750 MG tablet; Take 1 tablet (750 mg total) by mouth 2 (two) times daily. TAKE ONE TABLET BY MOUTH ONCE DAILY IN THE MORNING AND TWO ONCE DAILY IN THE EVENING  Other orders -     rosuvastatin (CRESTOR) 20 MG tablet; Take 1 tablet (20 mg total) by mouth daily. For cholesterol -     Vitamin D, Ergocalciferol, (DRISDOL) 1.25 MG (50000 UNIT) CAPS capsule; Take 1 capsule (50,000 Units total) by mouth every 7 (seven) days.   I have changed [name]'s metFORMIN. I am also having her maintain her aspirin, glucose blood, vitamin C, zinc gluconate, Multivitamin Adult, carvedilol, esomeprazole, levETIRAcetam, rosuvastatin, and Vitamin D (Ergocalciferol).  Meds ordered this encounter  Medications  . carvedilol (COREG) 6.25 MG tablet    Sig: Take one tablet twice daily with a meal    Dispense:  180 tablet    Refill:  0    This is an updated scrip to replace the one I just sent so that pt. Can have a 90 day supply. Thanks!  Marland Kitchen esomeprazole (NEXIUM) 40 MG capsule    Sig: TAKE ONE CAPSULE BY MOUTH ONCE  DAILY on an empty stomach    Dispense:  90 capsule    Refill:  0  . levETIRAcetam (KEPPRA) 750 MG tablet    Sig: Take 1 tablet (750 mg total) by mouth 2 (two) times daily. TAKE ONE TABLET BY MOUTH ONCE DAILY IN THE MORNING AND TWO ONCE DAILY IN THE EVENING    Dispense:  270 tablet    Refill:  0  . metFORMIN (GLUCOPHAGE-XR) 500 MG 24 hr tablet    Sig: Take 1 tablet (500 mg total) by mouth daily with breakfast.    Dispense:  90 tablet    Refill:  0  . rosuvastatin (CRESTOR) 20 MG tablet    Sig: Take 1 tablet (20 mg total) by mouth daily. For cholesterol    Dispense:  90 tablet    Refill:  0  . Vitamin D, Ergocalciferol, (DRISDOL) 1.25 MG (50000 UNIT) CAPS capsule    Sig: Take 1 capsule (50,000 Units total) by mouth every 7 (seven) days.    Dispense:  13 capsule    Refill:  0     Follow-up: No follow-ups on file.  Claretta Fraise, M.D.

## 2020-06-06 LAB — LIPID PANEL
Chol/HDL Ratio: 3.8 ratio (ref 0.0–4.4)
Cholesterol, Total: 219 mg/dL — ABNORMAL HIGH (ref 100–199)
HDL: 57 mg/dL (ref >39)
LDL Chol Calc (NIH): 107 mg/dL — ABNORMAL HIGH (ref 0–99)
Triglycerides: 326 mg/dL — ABNORMAL HIGH (ref 0–149)
VLDL Cholesterol Cal: 55 mg/dL — ABNORMAL HIGH (ref 5–40)

## 2020-06-06 LAB — CBC WITH DIFFERENTIAL/PLATELET
Basophils Absolute: 0 10*3/uL (ref 0.0–0.2)
Basos: 1 %
EOS (ABSOLUTE): 0.1 10*3/uL (ref 0.0–0.4)
Eos: 2 %
Hematocrit: 40 % (ref 34.0–46.6)
Hemoglobin: 12.7 g/dL (ref 11.1–15.9)
Immature Grans (Abs): 0 10*3/uL (ref 0.0–0.1)
Immature Granulocytes: 0 %
Lymphocytes Absolute: 2.5 10*3/uL (ref 0.7–3.1)
Lymphs: 50 %
MCH: 27.5 pg (ref 26.6–33.0)
MCHC: 31.8 g/dL (ref 31.5–35.7)
MCV: 87 fL (ref 79–97)
Monocytes Absolute: 0.4 10*3/uL (ref 0.1–0.9)
Monocytes: 8 %
Neutrophils Absolute: 2 10*3/uL (ref 1.4–7.0)
Neutrophils: 39 %
Platelets: 303 10*3/uL (ref 150–450)
RBC: 4.61 x10E6/uL (ref 3.77–5.28)
RDW: 12.9 % (ref 11.7–15.4)
WBC: 5.1 10*3/uL (ref 3.4–10.8)

## 2020-06-06 LAB — CMP14+EGFR
ALT: 11 IU/L (ref 0–32)
AST: 17 IU/L (ref 0–40)
Albumin/Globulin Ratio: 1.7 (ref 1.2–2.2)
Albumin: 4.6 g/dL (ref 3.7–4.7)
Alkaline Phosphatase: 92 IU/L (ref 44–121)
BUN/Creatinine Ratio: 22 (ref 12–28)
BUN: 21 mg/dL (ref 8–27)
Bilirubin Total: <0.2 mg/dL (ref 0.0–1.2)
CO2: 24 mmol/L (ref 20–29)
Calcium: 9.9 mg/dL (ref 8.7–10.3)
Chloride: 102 mmol/L (ref 96–106)
Creatinine, Ser: 0.95 mg/dL (ref 0.57–1.00)
Globulin, Total: 2.7 g/dL (ref 1.5–4.5)
Glucose: 121 mg/dL — ABNORMAL HIGH (ref 65–99)
Potassium: 4.4 mmol/L (ref 3.5–5.2)
Sodium: 144 mmol/L (ref 134–144)
Total Protein: 7.3 g/dL (ref 6.0–8.5)
eGFR: 62 mL/min/{1.73_m2} (ref >59)

## 2020-06-06 LAB — HEPATITIS C ANTIBODY: Hep C Virus Ab: <0.1 s/co ratio (ref 0.0–0.9)

## 2020-08-07 ENCOUNTER — Other Ambulatory Visit: Payer: Self-pay | Admitting: *Deleted

## 2020-08-07 MED ORDER — VITAMIN D (ERGOCALCIFEROL) 1.25 MG (50000 UNIT) PO CAPS: 50000.0000 [IU] | ORAL_CAPSULE | ORAL | 0 refills | Status: DC

## 2020-09-29 ENCOUNTER — Other Ambulatory Visit: Payer: Self-pay | Admitting: Family Medicine

## 2020-09-29 DIAGNOSIS — I10 Essential (primary) hypertension: Secondary | ICD-10-CM

## 2020-09-29 DIAGNOSIS — K2100 Gastro-esophageal reflux disease with esophagitis, without bleeding: Secondary | ICD-10-CM

## 2020-09-29 DIAGNOSIS — I69398 Other sequelae of cerebral infarction: Secondary | ICD-10-CM

## 2020-09-29 NOTE — Telephone Encounter (Signed)
  Prescription Request  09/29/2020  What is the name of the medication or equipment? All meds  Have you contacted your pharmacy to request a refill? (if applicable) yes  Which pharmacy would you like this sent to? [place], [place]   Pts daughter called stating that pt is almost out of all meds and needs refills. Pt and daughter moved to [place] and are working on getting pts medicaid switched over so they can get pt established with a new provider to get refills but medicaid has not been switched yet.  Can call Roland Rack at [phone]

## 2020-10-03 ENCOUNTER — Other Ambulatory Visit: Payer: Self-pay | Admitting: Family Medicine

## 2020-10-03 DIAGNOSIS — E119 Type 2 diabetes mellitus without complications: Secondary | ICD-10-CM

## 2020-10-04 ENCOUNTER — Other Ambulatory Visit: Payer: Self-pay | Admitting: Family Medicine

## 2020-10-04 DIAGNOSIS — I10 Essential (primary) hypertension: Secondary | ICD-10-CM

## 2020-10-04 DIAGNOSIS — G40909 Epilepsy, unspecified, not intractable, without status epilepticus: Secondary | ICD-10-CM

## 2020-10-04 DIAGNOSIS — K2100 Gastro-esophageal reflux disease with esophagitis, without bleeding: Secondary | ICD-10-CM

## 2020-10-04 MED ORDER — LEVETIRACETAM 750 MG PO TABS
750.0000 mg | ORAL_TABLET | Freq: Two times a day (BID) | ORAL | 0 refills | Status: DC
Start: 2020-10-04 — End: 2020-10-05

## 2020-10-04 MED ORDER — CARVEDILOL 6.25 MG PO TABS: ORAL_TABLET | ORAL | 0 refills | Status: DC

## 2020-10-04 MED ORDER — ESOMEPRAZOLE MAGNESIUM 40 MG PO CPDR: DELAYED_RELEASE_CAPSULE | ORAL | 0 refills | Status: DC

## 2020-10-04 NOTE — Telephone Encounter (Signed)
IS IT OKAY TO REFILL PLEASE ADVISE

## 2020-10-25 ENCOUNTER — Telehealth: Payer: Self-pay | Admitting: Family Medicine

## 2020-10-25 ENCOUNTER — Other Ambulatory Visit: Payer: Self-pay | Admitting: Family Medicine

## 2020-10-25 DIAGNOSIS — K2100 Gastro-esophageal reflux disease with esophagitis, without bleeding: Secondary | ICD-10-CM

## 2020-10-25 NOTE — Telephone Encounter (Signed)
Spoke with patient's daugher, pharmacy is [facility], [place], [place].  I changed her pharmacy to this address.  She is requesting that we send in a 90 day supply because she is in the process of getting her mother's insurance switched from [place] to [place] and it could take up to 3 months.

## 2020-10-25 NOTE — Telephone Encounter (Signed)
Left message to call back  

## 2020-10-25 NOTE — Telephone Encounter (Signed)
Please verify the pharmacy that she wants to use since she has moved.

## 2020-10-25 NOTE — Telephone Encounter (Signed)
Pt has moved to [place] and is unable to come into the office to have an appt but is in need of refills on her medications. Please call back and advise.

## 2020-10-26 ENCOUNTER — Other Ambulatory Visit: Payer: Self-pay | Admitting: Family Medicine

## 2020-10-26 DIAGNOSIS — K2100 Gastro-esophageal reflux disease with esophagitis, without bleeding: Secondary | ICD-10-CM

## 2020-10-26 DIAGNOSIS — G40909 Epilepsy, unspecified, not intractable, without status epilepticus: Secondary | ICD-10-CM

## 2020-10-26 DIAGNOSIS — I10 Essential (primary) hypertension: Secondary | ICD-10-CM

## 2020-10-26 DIAGNOSIS — I69398 Other sequelae of cerebral infarction: Secondary | ICD-10-CM

## 2020-10-26 DIAGNOSIS — E119 Type 2 diabetes mellitus without complications: Secondary | ICD-10-CM

## 2020-10-26 MED ORDER — ROSUVASTATIN CALCIUM 20 MG PO TABS: 20.0000 mg | ORAL_TABLET | Freq: Every day | ORAL | 1 refills | Status: AC

## 2020-10-26 MED ORDER — METFORMIN HCL ER 500 MG PO TB24: 500.0000 mg | ORAL_TABLET | Freq: Every day | ORAL | 1 refills | Status: AC

## 2020-10-26 MED ORDER — VITAMIN D (ERGOCALCIFEROL) 1.25 MG (50000 UNIT) PO CAPS: 50000.0000 [IU] | ORAL_CAPSULE | ORAL | 1 refills | Status: AC

## 2020-10-26 MED ORDER — ESOMEPRAZOLE MAGNESIUM 40 MG PO CPDR: DELAYED_RELEASE_CAPSULE | ORAL | 1 refills | Status: AC

## 2020-10-26 MED ORDER — LEVETIRACETAM 750 MG PO TABS: ORAL_TABLET | ORAL | 1 refills | Status: AC

## 2020-10-26 MED ORDER — CARVEDILOL 6.25 MG PO TABS: ORAL_TABLET | ORAL | 1 refills | Status: AC

## 2020-10-26 NOTE — Telephone Encounter (Signed)
Please let the patient know that I sent their prescription to their pharmacy. Thanks, WS 

## 2020-10-26 NOTE — Telephone Encounter (Signed)
Left detailed message.   

## 2021-07-24 ENCOUNTER — Other Ambulatory Visit: Payer: Self-pay | Admitting: Family Medicine

## 2021-07-24 DIAGNOSIS — E119 Type 2 diabetes mellitus without complications: Secondary | ICD-10-CM

## 2021-07-25 ENCOUNTER — Other Ambulatory Visit: Payer: Self-pay | Admitting: Family Medicine

## 2021-07-25 DIAGNOSIS — E119 Type 2 diabetes mellitus without complications: Secondary | ICD-10-CM

## 2021-07-25 DIAGNOSIS — K2100 Gastro-esophageal reflux disease with esophagitis, without bleeding: Secondary | ICD-10-CM

## 2021-07-25 DIAGNOSIS — I10 Essential (primary) hypertension: Secondary | ICD-10-CM

## 2021-08-02 ENCOUNTER — Telehealth: Payer: Self-pay | Admitting: Family Medicine

## 2021-08-02 NOTE — Telephone Encounter (Signed)
?  Prescription Request ? ?08/02/2021 ? ? ?What is the name of the medication or equipment? ALL ? ?Have you contacted your pharmacy to request a refill? YES ? ?Which pharmacy would you like this sent to? [facility], [place] ? ?Pt moved to [place] with her daughter and needs refills sent in for all of her meds to last her until her doctors appt with new doctor on 09/03/21. ?

## 2021-08-02 NOTE — Telephone Encounter (Addendum)
Explained that pt has not been seen since March 2022 it has been over a year and we could not refill any medications. She explained that she has had a hard time getting her MCD & food stamps transferred over and now the first appt she could get was in June and she only has about a week left of medications. I also explained to her that I had mentioned this to our lead provider and he also said that we would not be able to refill any medications. In looking at pt's chart, I also noteiced pt has been in [place] since last July. Roland Rack asked what she should do about getting her BP meds until her appt. Suggested at Urgent Care would be the best option. ?

## 2021-09-21 ENCOUNTER — Other Ambulatory Visit: Payer: Self-pay | Admitting: Family Medicine

## 2021-09-21 DIAGNOSIS — K2100 Gastro-esophageal reflux disease with esophagitis, without bleeding: Secondary | ICD-10-CM

## 2021-10-04 ENCOUNTER — Other Ambulatory Visit: Payer: Self-pay | Admitting: *Deleted

## 2021-10-04 DIAGNOSIS — E119 Type 2 diabetes mellitus without complications: Secondary | ICD-10-CM

## 2021-10-15 ENCOUNTER — Encounter: Payer: Self-pay | Admitting: Internal Medicine

## 2021-11-30 DEATH — deceased

## 2022-08-19 NOTE — Telephone Encounter (Signed)
Erroneous encounter will close.
